# Patient Record
Sex: Female | Born: 1945 | Race: White | Hispanic: No | Marital: Married | Smoking: Never smoker
Health system: Southern US, Community
[De-identification: ages and names within clinical notes are randomized; demographics above are authoritative.]

## PROBLEM LIST (undated history)

## (undated) DIAGNOSIS — R1011 Right upper quadrant pain: Secondary | ICD-10-CM

## (undated) DIAGNOSIS — M1990 Unspecified osteoarthritis, unspecified site: Secondary | ICD-10-CM

## (undated) DIAGNOSIS — K5900 Constipation, unspecified: Secondary | ICD-10-CM

## (undated) DIAGNOSIS — T148XXA Other injury of unspecified body region, initial encounter: Secondary | ICD-10-CM

## (undated) DIAGNOSIS — R140 Abdominal distension (gaseous): Secondary | ICD-10-CM

## (undated) DIAGNOSIS — N390 Urinary tract infection, site not specified: Secondary | ICD-10-CM

## (undated) DIAGNOSIS — K219 Gastro-esophageal reflux disease without esophagitis: Secondary | ICD-10-CM

## (undated) DIAGNOSIS — R011 Cardiac murmur, unspecified: Secondary | ICD-10-CM

## (undated) DIAGNOSIS — G709 Myoneural disorder, unspecified: Secondary | ICD-10-CM

## (undated) DIAGNOSIS — T4145XA Adverse effect of unspecified anesthetic, initial encounter: Secondary | ICD-10-CM

## (undated) DIAGNOSIS — T8859XA Other complications of anesthesia, initial encounter: Secondary | ICD-10-CM

## (undated) DIAGNOSIS — F419 Anxiety disorder, unspecified: Secondary | ICD-10-CM

## (undated) DIAGNOSIS — F329 Major depressive disorder, single episode, unspecified: Secondary | ICD-10-CM

## (undated) DIAGNOSIS — R110 Nausea: Secondary | ICD-10-CM

## (undated) DIAGNOSIS — G479 Sleep disorder, unspecified: Secondary | ICD-10-CM

## (undated) DIAGNOSIS — I10 Essential (primary) hypertension: Secondary | ICD-10-CM

## (undated) DIAGNOSIS — F32A Depression, unspecified: Secondary | ICD-10-CM

## (undated) DIAGNOSIS — R3129 Other microscopic hematuria: Secondary | ICD-10-CM

## (undated) DIAGNOSIS — T7840XA Allergy, unspecified, initial encounter: Secondary | ICD-10-CM

## (undated) HISTORY — PX: CYST EXCISION: SHX5701

## (undated) HISTORY — PX: OTHER SURGICAL HISTORY: SHX169

## (undated) HISTORY — DX: Depression, unspecified: F32.A

## (undated) HISTORY — PX: BREAST LUMPECTOMY: SHX2

## (undated) HISTORY — DX: Unspecified osteoarthritis, unspecified site: M19.90

## (undated) HISTORY — DX: Allergy, unspecified, initial encounter: T78.40XA

## (undated) HISTORY — DX: Other injury of unspecified body region, initial encounter: T14.8XXA

## (undated) HISTORY — DX: Myoneural disorder, unspecified: G70.9

## (undated) HISTORY — PX: COLONOSCOPY: SHX174

## (undated) HISTORY — DX: Major depressive disorder, single episode, unspecified: F32.9

---

## 1995-01-05 HISTORY — PX: REFRACTIVE SURGERY: SHX103

## 1998-09-10 ENCOUNTER — Other Ambulatory Visit: Admission: RE | Admit: 1998-09-10 | Discharge: 1998-09-10 | Payer: Self-pay | Admitting: *Deleted

## 1998-10-30 ENCOUNTER — Encounter: Payer: Self-pay | Admitting: Family Medicine

## 1998-10-30 ENCOUNTER — Encounter: Admission: RE | Admit: 1998-10-30 | Discharge: 1998-10-30 | Payer: Self-pay | Admitting: Family Medicine

## 1999-09-03 ENCOUNTER — Other Ambulatory Visit: Admission: RE | Admit: 1999-09-03 | Discharge: 1999-09-03 | Payer: Self-pay | Admitting: *Deleted

## 2000-10-24 ENCOUNTER — Other Ambulatory Visit: Admission: RE | Admit: 2000-10-24 | Discharge: 2000-10-24 | Payer: Self-pay | Admitting: *Deleted

## 2001-02-24 ENCOUNTER — Encounter: Admission: RE | Admit: 2001-02-24 | Discharge: 2001-02-24 | Payer: Self-pay | Admitting: *Deleted

## 2001-02-24 ENCOUNTER — Encounter: Payer: Self-pay | Admitting: *Deleted

## 2001-11-20 ENCOUNTER — Other Ambulatory Visit: Admission: RE | Admit: 2001-11-20 | Discharge: 2001-11-20 | Payer: Self-pay | Admitting: Obstetrics & Gynecology

## 2002-03-03 ENCOUNTER — Encounter: Payer: Self-pay | Admitting: Family Medicine

## 2002-03-03 ENCOUNTER — Encounter: Admission: RE | Admit: 2002-03-03 | Discharge: 2002-03-03 | Payer: Self-pay | Admitting: Family Medicine

## 2002-10-15 ENCOUNTER — Emergency Department (HOSPITAL_COMMUNITY): Admission: EM | Admit: 2002-10-15 | Discharge: 2002-10-15 | Payer: Self-pay | Admitting: Emergency Medicine

## 2002-10-18 ENCOUNTER — Encounter: Payer: Self-pay | Admitting: Neurology

## 2002-10-18 ENCOUNTER — Encounter: Admission: RE | Admit: 2002-10-18 | Discharge: 2002-10-18 | Payer: Self-pay | Admitting: Neurology

## 2002-10-19 ENCOUNTER — Encounter: Payer: Self-pay | Admitting: Orthopedic Surgery

## 2002-10-19 ENCOUNTER — Encounter: Admission: RE | Admit: 2002-10-19 | Discharge: 2002-10-19 | Payer: Self-pay | Admitting: Orthopedic Surgery

## 2002-10-29 ENCOUNTER — Encounter: Payer: Self-pay | Admitting: Neurology

## 2002-10-29 ENCOUNTER — Ambulatory Visit (HOSPITAL_COMMUNITY): Admission: RE | Admit: 2002-10-29 | Discharge: 2002-10-29 | Payer: Self-pay | Admitting: Neurology

## 2002-11-26 ENCOUNTER — Other Ambulatory Visit: Admission: RE | Admit: 2002-11-26 | Discharge: 2002-11-26 | Payer: Self-pay | Admitting: Obstetrics & Gynecology

## 2003-04-23 ENCOUNTER — Ambulatory Visit (HOSPITAL_COMMUNITY): Admission: RE | Admit: 2003-04-23 | Discharge: 2003-04-23 | Payer: Self-pay | Admitting: Internal Medicine

## 2003-04-28 ENCOUNTER — Ambulatory Visit (HOSPITAL_COMMUNITY): Admission: RE | Admit: 2003-04-28 | Discharge: 2003-04-28 | Payer: Self-pay | Admitting: Internal Medicine

## 2003-05-14 ENCOUNTER — Ambulatory Visit (HOSPITAL_COMMUNITY): Admission: RE | Admit: 2003-05-14 | Discharge: 2003-05-14 | Payer: Self-pay | Admitting: Internal Medicine

## 2003-11-21 ENCOUNTER — Ambulatory Visit: Payer: Self-pay | Admitting: Internal Medicine

## 2004-01-01 ENCOUNTER — Ambulatory Visit: Payer: Self-pay | Admitting: Internal Medicine

## 2004-04-29 ENCOUNTER — Ambulatory Visit: Payer: Self-pay | Admitting: Internal Medicine

## 2004-05-12 ENCOUNTER — Ambulatory Visit: Payer: Self-pay | Admitting: Internal Medicine

## 2004-06-18 ENCOUNTER — Emergency Department (HOSPITAL_COMMUNITY): Admission: EM | Admit: 2004-06-18 | Discharge: 2004-06-18 | Payer: Self-pay | Admitting: Emergency Medicine

## 2004-07-02 ENCOUNTER — Ambulatory Visit (HOSPITAL_COMMUNITY): Admission: RE | Admit: 2004-07-02 | Discharge: 2004-07-02 | Payer: Self-pay | Admitting: Neurology

## 2004-11-10 ENCOUNTER — Ambulatory Visit: Payer: Self-pay | Admitting: Internal Medicine

## 2004-12-17 ENCOUNTER — Ambulatory Visit: Payer: Self-pay | Admitting: Internal Medicine

## 2005-03-12 ENCOUNTER — Encounter: Admission: RE | Admit: 2005-03-12 | Discharge: 2005-03-12 | Payer: Self-pay | Admitting: Obstetrics and Gynecology

## 2005-04-12 ENCOUNTER — Encounter: Admission: RE | Admit: 2005-04-12 | Discharge: 2005-04-12 | Payer: Self-pay | Admitting: Internal Medicine

## 2005-06-18 ENCOUNTER — Encounter: Admission: RE | Admit: 2005-06-18 | Discharge: 2005-06-18 | Payer: Self-pay | Admitting: Internal Medicine

## 2005-06-18 IMAGING — RF DG FL GUIDE NDL PLMT  - NRPT [facility]
1 series · 1 of 1 positions shown · IV contrast (gadolinium)
Comparison: none

CLINICAL DATA: Right hip pain. 
 FLUOROSCOPIC GUIDANCE FOR NEEDLE PLACEMENT RIGHT HIP ARTHROGRAM ([DATE] HOURS):
 Procedure:  The right anterior hip region was prepped and draped in a sterile fashion and lidocaine was utilized for local anesthesia.  Under fluoroscopic guidance, a 22 gauge spinal needle was inserted into the right hip joint.  Iodinated contrast, Lidocaine, and gadolinium were injected into the joint.  No complication.

[Series 1: (hospital) · 1 of 1 slices shown]
[im 1/1]
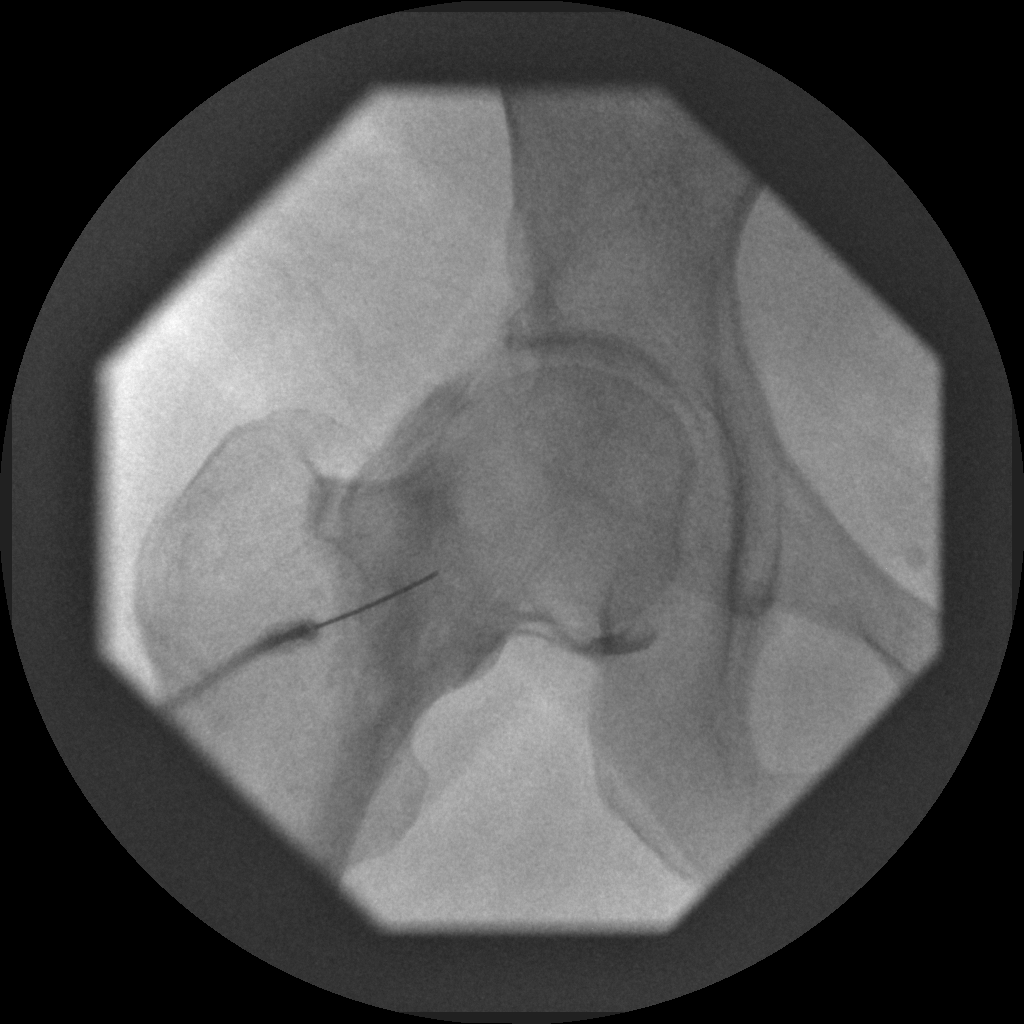

[1 of 1 positions shown; findings below may reference images not displayed]

FINDINGS: Images document needle placement in the right hip joint.  Contrast fills the right hip joint.
IMPRESSION: Right hip arthrogram in preparation for MRI.

## 2005-09-13 ENCOUNTER — Ambulatory Visit: Payer: Self-pay | Admitting: Internal Medicine

## 2005-10-01 ENCOUNTER — Ambulatory Visit: Payer: Self-pay | Admitting: Gastroenterology

## 2005-10-05 ENCOUNTER — Ambulatory Visit: Payer: Self-pay | Admitting: Internal Medicine

## 2006-01-24 ENCOUNTER — Ambulatory Visit: Payer: Self-pay | Admitting: Internal Medicine

## 2006-02-15 ENCOUNTER — Ambulatory Visit: Payer: Self-pay | Admitting: Internal Medicine

## 2006-02-15 LAB — CONVERTED CEMR LAB
Albumin: 3.9 g/dL (ref 3.5–5.2)
Basophils Absolute: 0 10*3/uL (ref 0.0–0.1)
Bilirubin, Direct: 0.1 mg/dL (ref 0.0–0.3)
Cholesterol: 192 mg/dL (ref 0–200)
Creatinine, Ser: 0.8 mg/dL (ref 0.4–1.2)
GFR calc non Af Amer: 78 mL/min
HDL: 42.1 mg/dL (ref >39.0)
Hemoglobin: 14.5 g/dL (ref 12.0–15.0)
Iron: 56 ug/dL (ref 42–145)
LDL Cholesterol: 134 mg/dL — ABNORMAL HIGH (ref 0–99)
Lymphocytes Relative: 21.2 % (ref 12.0–46.0)
MCHC: 34.8 g/dL (ref 30.0–36.0)
Monocytes Absolute: 0.4 10*3/uL (ref 0.2–0.7)
Monocytes Relative: 5.4 % (ref 3.0–11.0)
Neutro Abs: 5.1 10*3/uL (ref 1.4–7.7)
Nitrite: NEGATIVE
Phosphorus: 3.7 mg/dL (ref 2.3–4.6)
Potassium: 3.4 meq/L — ABNORMAL LOW (ref 3.5–5.1)
RDW: 12.5 % (ref 11.5–14.6)
Sodium: 140 meq/L (ref 135–145)
Specific Gravity, Urine: 1.02 (ref 1.000–1.03)
TSH: 2.13 microintl units/mL (ref 0.35–5.50)
Total Bilirubin: 1 mg/dL (ref 0.3–1.2)
Transferrin: 226.2 mg/dL (ref >=212.0)
Urine Glucose: NEGATIVE mg/dL
Urobilinogen, UA: 0.2 (ref 0.0–1.0)
pH: 6.5 (ref 5.0–8.0)

## 2006-04-20 ENCOUNTER — Encounter: Admission: RE | Admit: 2006-04-20 | Discharge: 2006-04-20 | Payer: Self-pay | Admitting: Obstetrics and Gynecology

## 2006-05-06 ENCOUNTER — Ambulatory Visit: Payer: Self-pay | Admitting: Internal Medicine

## 2006-07-30 ENCOUNTER — Encounter: Payer: Self-pay | Admitting: Internal Medicine

## 2006-07-30 DIAGNOSIS — I10 Essential (primary) hypertension: Secondary | ICD-10-CM | POA: Insufficient documentation

## 2006-07-30 DIAGNOSIS — N390 Urinary tract infection, site not specified: Secondary | ICD-10-CM

## 2006-11-02 ENCOUNTER — Ambulatory Visit: Payer: Self-pay | Admitting: Internal Medicine

## 2006-11-09 ENCOUNTER — Telehealth: Payer: Self-pay | Admitting: Internal Medicine

## 2006-11-28 ENCOUNTER — Encounter: Payer: Self-pay | Admitting: Internal Medicine

## 2007-01-07 ENCOUNTER — Encounter: Admission: RE | Admit: 2007-01-07 | Discharge: 2007-01-07 | Payer: Self-pay | Admitting: Orthopaedic Surgery

## 2007-02-03 ENCOUNTER — Encounter: Payer: Self-pay | Admitting: Internal Medicine

## 2007-03-10 ENCOUNTER — Ambulatory Visit: Payer: Self-pay | Admitting: Internal Medicine

## 2007-04-15 ENCOUNTER — Telehealth (INDEPENDENT_AMBULATORY_CARE_PROVIDER_SITE_OTHER): Payer: Self-pay | Admitting: *Deleted

## 2007-04-15 ENCOUNTER — Ambulatory Visit: Payer: Self-pay | Admitting: Family Medicine

## 2007-04-15 DIAGNOSIS — T22019A Burn of unspecified degree of unspecified forearm, initial encounter: Secondary | ICD-10-CM | POA: Insufficient documentation

## 2007-05-08 ENCOUNTER — Telehealth: Payer: Self-pay | Admitting: Internal Medicine

## 2007-06-01 ENCOUNTER — Encounter: Payer: Self-pay | Admitting: Internal Medicine

## 2007-06-09 ENCOUNTER — Ambulatory Visit: Payer: Self-pay | Admitting: Internal Medicine

## 2007-06-09 DIAGNOSIS — K219 Gastro-esophageal reflux disease without esophagitis: Secondary | ICD-10-CM

## 2007-06-09 DIAGNOSIS — R252 Cramp and spasm: Secondary | ICD-10-CM

## 2007-06-09 DIAGNOSIS — M545 Low back pain: Secondary | ICD-10-CM

## 2007-06-12 LAB — CONVERTED CEMR LAB
BUN: 17 mg/dL (ref 6–23)
Bilirubin Urine: NEGATIVE
Chloride: 103 meq/L (ref 96–112)
Creatinine, Ser: 0.6 mg/dL (ref 0.4–1.2)
Crystals: NEGATIVE
GFR calc non Af Amer: 108 mL/min
Glucose, Bld: 87 mg/dL (ref 70–99)
Ketones, ur: NEGATIVE mg/dL
Mucus, UA: NEGATIVE
Potassium: 4.1 meq/L (ref 3.5–5.1)
Urine Glucose: NEGATIVE mg/dL
Urobilinogen, UA: 0.2 (ref 0.0–1.0)

## 2007-07-11 ENCOUNTER — Encounter: Payer: Self-pay | Admitting: Internal Medicine

## 2007-07-18 ENCOUNTER — Telehealth: Payer: Self-pay | Admitting: Internal Medicine

## 2007-07-19 ENCOUNTER — Telehealth: Payer: Self-pay | Admitting: Internal Medicine

## 2007-07-24 ENCOUNTER — Encounter: Payer: Self-pay | Admitting: Internal Medicine

## 2007-07-27 ENCOUNTER — Encounter (INDEPENDENT_AMBULATORY_CARE_PROVIDER_SITE_OTHER): Payer: Self-pay | Admitting: Interventional Radiology

## 2007-07-27 ENCOUNTER — Encounter: Payer: Self-pay | Admitting: Internal Medicine

## 2007-07-27 ENCOUNTER — Encounter: Admission: RE | Admit: 2007-07-27 | Discharge: 2007-07-27 | Payer: Self-pay | Admitting: Internal Medicine

## 2007-07-27 ENCOUNTER — Other Ambulatory Visit: Admission: RE | Admit: 2007-07-27 | Discharge: 2007-07-27 | Payer: Self-pay | Admitting: Interventional Radiology

## 2007-07-28 ENCOUNTER — Ambulatory Visit: Payer: Self-pay | Admitting: Internal Medicine

## 2007-07-28 ENCOUNTER — Encounter: Payer: Self-pay | Admitting: Internal Medicine

## 2007-08-01 ENCOUNTER — Telehealth (INDEPENDENT_AMBULATORY_CARE_PROVIDER_SITE_OTHER): Payer: Self-pay | Admitting: *Deleted

## 2007-08-07 ENCOUNTER — Encounter: Payer: Self-pay | Admitting: Internal Medicine

## 2007-08-11 ENCOUNTER — Ambulatory Visit: Payer: Self-pay | Admitting: Endocrinology

## 2007-09-01 ENCOUNTER — Telehealth: Payer: Self-pay | Admitting: Internal Medicine

## 2007-09-19 ENCOUNTER — Telehealth: Payer: Self-pay | Admitting: Internal Medicine

## 2007-12-04 ENCOUNTER — Encounter: Payer: Self-pay | Admitting: Internal Medicine

## 2007-12-12 ENCOUNTER — Ambulatory Visit: Payer: Self-pay | Admitting: Internal Medicine

## 2007-12-12 DIAGNOSIS — R109 Unspecified abdominal pain: Secondary | ICD-10-CM | POA: Insufficient documentation

## 2007-12-12 DIAGNOSIS — R1011 Right upper quadrant pain: Secondary | ICD-10-CM | POA: Insufficient documentation

## 2007-12-14 LAB — CONVERTED CEMR LAB
Albumin: 3.7 g/dL (ref 3.5–5.2)
Alkaline Phosphatase: 71 units/L (ref 39–117)
Amylase: 71 units/L (ref 27–131)
Basophils Absolute: 0 10*3/uL (ref 0.0–0.1)
Basophils Relative: 0.1 % (ref 0.0–3.0)
Crystals: NEGATIVE
Eosinophils Absolute: 0.1 10*3/uL (ref 0.0–0.7)
Eosinophils Relative: 1.6 % (ref 0.0–5.0)
HCT: 40.7 % (ref 36.0–46.0)
Hemoglobin: 13.9 g/dL (ref 12.0–15.0)
Ketones, ur: NEGATIVE mg/dL
Lipase: 18 units/L (ref 11.0–59.0)
MCHC: 34.2 g/dL (ref 30.0–36.0)
MCV: 81.8 fL (ref 78.0–100.0)
Monocytes Absolute: 0.6 10*3/uL (ref 0.1–1.0)
Neutro Abs: 5.8 10*3/uL (ref 1.4–7.7)
RBC: 4.97 M/uL (ref 3.87–5.11)
Specific Gravity, Urine: 1.025 (ref 1.000–1.03)
Total Protein: 7.1 g/dL (ref 6.0–8.3)
Urine Glucose: NEGATIVE mg/dL
WBC: 8.8 10*3/uL (ref 4.5–10.5)

## 2007-12-19 ENCOUNTER — Encounter: Admission: RE | Admit: 2007-12-19 | Discharge: 2007-12-19 | Payer: Self-pay | Admitting: Internal Medicine

## 2007-12-25 ENCOUNTER — Telehealth (INDEPENDENT_AMBULATORY_CARE_PROVIDER_SITE_OTHER): Payer: Self-pay | Admitting: *Deleted

## 2008-01-14 ENCOUNTER — Encounter: Payer: Self-pay | Admitting: Endocrinology

## 2008-01-30 ENCOUNTER — Encounter: Admission: RE | Admit: 2008-01-30 | Discharge: 2008-01-30 | Payer: Self-pay | Admitting: Endocrinology

## 2008-06-06 ENCOUNTER — Encounter: Payer: Self-pay | Admitting: Internal Medicine

## 2008-06-06 ENCOUNTER — Ambulatory Visit: Payer: Self-pay | Admitting: Internal Medicine

## 2008-06-06 DIAGNOSIS — R079 Chest pain, unspecified: Secondary | ICD-10-CM

## 2008-06-10 ENCOUNTER — Encounter (INDEPENDENT_AMBULATORY_CARE_PROVIDER_SITE_OTHER): Payer: Self-pay | Admitting: *Deleted

## 2008-06-13 ENCOUNTER — Telehealth (INDEPENDENT_AMBULATORY_CARE_PROVIDER_SITE_OTHER): Payer: Self-pay | Admitting: Radiology

## 2008-06-13 ENCOUNTER — Telehealth (INDEPENDENT_AMBULATORY_CARE_PROVIDER_SITE_OTHER): Payer: Self-pay | Admitting: *Deleted

## 2008-06-13 ENCOUNTER — Encounter: Payer: Self-pay | Admitting: Internal Medicine

## 2008-06-14 ENCOUNTER — Encounter: Payer: Self-pay | Admitting: Internal Medicine

## 2008-06-14 ENCOUNTER — Telehealth: Payer: Self-pay | Admitting: Internal Medicine

## 2008-06-19 ENCOUNTER — Telehealth (INDEPENDENT_AMBULATORY_CARE_PROVIDER_SITE_OTHER): Payer: Self-pay | Admitting: *Deleted

## 2008-06-20 ENCOUNTER — Ambulatory Visit: Payer: Self-pay

## 2008-06-20 ENCOUNTER — Encounter: Payer: Self-pay | Admitting: Internal Medicine

## 2008-06-27 ENCOUNTER — Telehealth: Payer: Self-pay | Admitting: Internal Medicine

## 2008-07-04 HISTORY — PX: FACIAL COSMETIC SURGERY: SHX629

## 2008-12-30 ENCOUNTER — Ambulatory Visit: Payer: Self-pay | Admitting: Internal Medicine

## 2009-04-29 ENCOUNTER — Ambulatory Visit: Payer: Self-pay | Admitting: Internal Medicine

## 2009-04-29 LAB — CONVERTED CEMR LAB
Albumin: 3.8 g/dL (ref 3.5–5.2)
Basophils Relative: 0.6 % (ref 0.0–3.0)
Bilirubin, Direct: 0.1 mg/dL (ref 0.0–0.3)
CO2: 31 meq/L (ref 19–32)
Calcium: 9.4 mg/dL (ref 8.4–10.5)
Creatinine, Ser: 0.7 mg/dL (ref 0.4–1.2)
Eosinophils Relative: 3.6 % (ref 0.0–5.0)
GFR calc non Af Amer: 89.47 mL/min (ref >60)
Glucose, Bld: 89 mg/dL (ref 70–99)
Hemoglobin: 13.4 g/dL (ref 12.0–15.0)
Lymphocytes Relative: 26.1 % (ref 12.0–46.0)
Neutrophils Relative %: 64.7 % (ref 43.0–77.0)
RBC: 4.78 M/uL (ref 3.87–5.11)
Total Protein: 6.6 g/dL (ref 6.0–8.3)
Vitamin B-12: 492 pg/mL (ref 211–911)
WBC: 6.1 10*3/uL (ref 4.5–10.5)

## 2009-05-21 LAB — CONVERTED CEMR LAB
Anti Nuclear Antibody(ANA): NEGATIVE
Vit D, 25-Hydroxy: 26 ng/mL — ABNORMAL LOW (ref 30–89)

## 2009-05-26 ENCOUNTER — Ambulatory Visit: Payer: Self-pay | Admitting: Internal Medicine

## 2009-05-26 DIAGNOSIS — R1013 Epigastric pain: Secondary | ICD-10-CM

## 2009-05-26 DIAGNOSIS — E559 Vitamin D deficiency, unspecified: Secondary | ICD-10-CM | POA: Insufficient documentation

## 2009-05-26 DIAGNOSIS — R1310 Dysphagia, unspecified: Secondary | ICD-10-CM | POA: Insufficient documentation

## 2009-07-02 ENCOUNTER — Ambulatory Visit: Payer: Self-pay | Admitting: Internal Medicine

## 2009-07-02 ENCOUNTER — Ambulatory Visit (HOSPITAL_COMMUNITY): Admission: RE | Admit: 2009-07-02 | Discharge: 2009-07-02 | Payer: Self-pay | Admitting: Internal Medicine

## 2009-07-02 DIAGNOSIS — R209 Unspecified disturbances of skin sensation: Secondary | ICD-10-CM

## 2009-07-03 ENCOUNTER — Encounter: Payer: Self-pay | Admitting: Internal Medicine

## 2009-07-11 ENCOUNTER — Encounter: Admission: RE | Admit: 2009-07-11 | Discharge: 2009-07-11 | Payer: Self-pay | Admitting: Obstetrics and Gynecology

## 2009-07-11 ENCOUNTER — Telehealth: Payer: Self-pay | Admitting: Internal Medicine

## 2009-07-15 ENCOUNTER — Encounter: Payer: Self-pay | Admitting: Internal Medicine

## 2009-07-22 ENCOUNTER — Encounter: Payer: Self-pay | Admitting: Internal Medicine

## 2009-07-25 ENCOUNTER — Ambulatory Visit: Payer: Self-pay | Admitting: Internal Medicine

## 2009-07-25 ENCOUNTER — Telehealth: Payer: Self-pay | Admitting: Internal Medicine

## 2009-08-22 ENCOUNTER — Ambulatory Visit (HOSPITAL_COMMUNITY): Admission: RE | Admit: 2009-08-22 | Discharge: 2009-08-22 | Payer: Self-pay | Admitting: Gastroenterology

## 2009-09-01 ENCOUNTER — Encounter: Payer: Self-pay | Admitting: Internal Medicine

## 2009-09-01 ENCOUNTER — Telehealth: Payer: Self-pay | Admitting: Internal Medicine

## 2009-10-13 ENCOUNTER — Telehealth: Payer: Self-pay | Admitting: Internal Medicine

## 2009-12-23 ENCOUNTER — Ambulatory Visit: Payer: Self-pay | Admitting: Internal Medicine

## 2010-01-24 ENCOUNTER — Encounter: Payer: Self-pay | Admitting: Internal Medicine

## 2010-01-25 ENCOUNTER — Encounter: Payer: Self-pay | Admitting: Neurology

## 2010-01-25 ENCOUNTER — Encounter: Payer: Self-pay | Admitting: Obstetrics and Gynecology

## 2010-01-25 ENCOUNTER — Encounter: Payer: Self-pay | Admitting: Internal Medicine

## 2010-02-01 LAB — CONVERTED CEMR LAB
BUN: 24 mg/dL — ABNORMAL HIGH (ref 6–23)
Basophils Relative: 0.4 % (ref 0.0–1.0)
Bilirubin, Direct: 0.1 mg/dL (ref 0.0–0.3)
CO2: 31 meq/L (ref 19–32)
Creatinine, Ser: 0.7 mg/dL (ref 0.4–1.2)
Eosinophils Relative: 3.6 % (ref 0.0–5.0)
GFR calc Af Amer: 109 mL/min
Glucose, Bld: 91 mg/dL (ref 70–99)
HCT: 37.7 % (ref 36.0–46.0)
Hemoglobin: 13.1 g/dL (ref 12.0–15.0)
Lymphocytes Relative: 25.8 % (ref 12.0–46.0)
Monocytes Absolute: 0.4 10*3/uL (ref 0.2–0.7)
Monocytes Relative: 5.2 % (ref 3.0–11.0)
Neutro Abs: 5.6 10*3/uL (ref 1.4–7.7)
Neutrophils Relative %: 65 % (ref 43.0–77.0)
Potassium: 3.8 meq/L (ref 3.5–5.1)
Sodium: 141 meq/L (ref 135–145)
TSH: 1.71 microintl units/mL (ref 0.35–5.50)
Total Bilirubin: 0.7 mg/dL (ref 0.3–1.2)
Total Protein: 6.1 g/dL (ref 6.0–8.3)
Vit D, 1,25-Dihydroxy: 23 (ref 20–57)

## 2010-02-03 NOTE — Assessment & Plan Note (Signed)
Summary: abd pain/cd   Vital Signs:  Patient profile:   65 year old female Height:      61 inches Weight:      189.25 pounds BMI:     35.89 O2 Sat:      97 % on Room air Temp:     98.4 degrees F oral Pulse rate:   64 / minute Pulse rhythm:   regular BP sitting:   118 / 74  (left arm) Cuff size:   large  Vitals Entered By: Rock Nephew CMA (May 26, 2009 9:49 AM)  O2 Flow:  Room air CC: abdominal pain x 2wks w/ heart burn Is Patient Diabetic? No Pain Assessment Patient in pain? yes     Location: abdomen Type: burning   Primary Care Provider:  Tresa Garter MD  CC:  abdominal pain x 2wks w/ heart burn.  History of Present Illness: C/o abd pain after eating a spicy Tai food 2 wks ZOX:WRUEAVW and mucusy diarrhea x3 d - was very sick. Then got better, resolved. This Sat she had [place] food - spicy; felt sick right away: chest pressure, nausea. Took gasex. C/o burning and belcing. C/o problem swallowing - worse lately. No SOB. No cardiac symptoms .  Allergies: 1)  ! Celebrex 2)  ! Penicillin  Physical Exam  General:  Well-developed,well-nourished,in no acute distress; alert,appropriate and cooperative throughout examination Eyes:  No corneal or conjunctival inflammation noted. EOMI. Perrla. Nose:  External nasal examination shows no deformity or inflammation. Nasal mucosa are pink and moist without lesions or exudates. Mouth:  Oral mucosa and oropharynx without lesions or exudates.  Teeth in good repair. Neck:  No deformities, masses, or tenderness noted. Lungs:  Normal respiratory effort, chest expands symmetrically. Lungs are clear to auscultation, no crackles or wheezes. Heart:  Normal rate and regular rhythm. S1 and S2 normal without gallop, murmur, click, rub or other extra sounds. Abdomen:  Tender epigastrium No mass No rebound no HSM Msk:  No deformity or scoliosis noted of thoracic or lumbar spine.   Pulses:  R and L carotid,radial,femoral,dorsalis  pedis and posterior tibial pulses are full and equal bilaterally Extremities:  No clubbing, cyanosis, edema, or deformity noted with normal full range of motion of all joints.   Neurologic:  No cranial nerve deficits noted. Station and gait are normal. Plantar reflexes are down-going bilaterally. DTRs are symmetrical throughout. Sensory, motor and coordinative functions appear intact. 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 Alert and cooperative with normal attention span and concentration. No apparent delusions, illusions, hallucinations   Impression & Recommendations:  Problem # 1:  ABDOMINAL PAIN, EPIGASTRIC (ICD-789.06) Assessment New Likely - severe GERD and Esoph. stricture. Orders: Gastroenterology Referral (GI) Dr Kinnie Scales The labs were reviewed with the patient.   Problem # 2:  DYSPHAGIA UNSPECIFIED (ICD-787.20) Assessment: New  Orders: Gastroenterology Referral (GI)  Problem # 3:  GERD (ICD-530.81) Assessment: New  Her updated medication list for this problem includes:    Nexium 40 Mg Cpdr (Esomeprazole magnesium) .Marland Kitchen... 1 by mouth qam    Librax 2.5-5 Mg Caps (Clidinium-chlordiazepoxide) .Marland Kitchen... 1 by mouth qid as needed spasms in stomach     Ranitidine Hcl 150 Mg Tabs (Ranitidine hcl) .Marland Kitchen... 1 by mouth two times a day for indigestion  Problem # 4:  CHEST PAIN (ICD-786.50) Assessment: New  Orders: Gastroenterology Referral (GI)  Problem # 5:  VITAMIN D  DEFICIENCY (ICD-268.9) Assessment: Comment Only Restart Vit D The labs were reviewed with the patient.   Problem # 6:  SCAR (ICD-709.2) on face Assessment: Improved The labs were reviewed with the patient. No CTD present  Complete Medication List: 1)  Potassium Chloride Cr 10 Meq Tbcr (Potassium chloride) .Marland Kitchen.. 1 once daily 2)  Zolpidem Tartrate 10 Mg Tabs (Zolpidem tartrate) .... 1/2 or 1 by mouth at hs prn 3)   Vitamin D3 1000 Unit Tabs (Cholecalciferol) .Marland Kitchen.. 1 by mouth daily 4)  Wellbutrin Xl 150 Mg Xr24h-tab (Bupropion hcl) .... Take 1 by mouth qd 5)  Furosemide 40 Mg Tabs (Furosemide) .Marland Kitchen.. 1 by mouth q am ( a water pill) prn 6)  Vitamin D 1000 Unit Tabs (Cholecalciferol) .Marland Kitchen.. 1 by mouth qd 7)  Triamcinolone Acetonide 0.5 % Crea (Triamcinolone acetonide) .... Use two times a day prn 8)  Promethazine-codeine 6.25-10 Mg/24ml Syrp (Promethazine-codeine) .... 5-10 ml by mouth q id as needed cough 9)  Nexium 40 Mg Cpdr (Esomeprazole magnesium) .Marland Kitchen.. 1 by mouth qam 10)  Librax 2.5-5 Mg Caps (Clidinium-chlordiazepoxide) .Marland Kitchen.. 1 by mouth qid as needed spasms in stomach 11)  Ranitidine Hcl 150 Mg Tabs (Ranitidine hcl) .Marland Kitchen.. 1 by mouth two times a day for indigestion  Patient Instructions: 1)  Call if you are not better in a reasonable amount of time or if worse. Go to ER if feeling really bad! 2)  Nexium 1 twice a day Prescriptions: LIBRAX 2.5-5 MG CAPS (CLIDINIUM-CHLORDIAZEPOXIDE) 1 by mouth qid as needed spasms in stomach  #60 x 1   Entered and Authorized by:   Tresa Garter MD   Signed by:   Tresa Garter MD on 05/26/2009   Method used:   Print then Give to Patient   RxID:   [id number] RANITIDINE HCL 150 MG TABS (RANITIDINE HCL) 1 by mouth two times a day for indigestion  #60 x 12   Entered and Authorized by:   Tresa Garter MD   Signed by:   Tresa Garter MD on 05/26/2009   Method used:   Print then Give to Patient   RxID:   [id number] NEXIUM 40 MG CPDR (ESOMEPRAZOLE MAGNESIUM) 1 by mouth qam  #30 x 12   Entered and Authorized by:   Tresa Garter MD   Signed by:   Tresa Garter MD on 05/26/2009   Method used:   Print then Give to Patient   RxID:   [id number]

## 2010-02-03 NOTE — Progress Notes (Signed)
Summary: RESULTS  Phone Note Call from Patient   Summary of Call: Patient is requesting to know if Dr Debby Bud recieved results of HIDA scan she had 2 wks ago. She would also like to know results. This was ordered by Dr Kinnie Scales. Their office will not give results over the phone and patient would like results from Dr Debby Bud.  Initial call taken by: Lamar Sprinkles, CMA,  September 01, 2009 3:14 PM  Follow-up for Phone Call        checking eChart: no cystic or common bile duct obstruction; normal gallbladder ejection fraction. Follow-up by: Jacques Navy MD,  September 01, 2009 3:42 PM  Additional Follow-up for Phone Call Additional follow up Details #1::        Pt informed  Additional Follow-up by: Lamar Sprinkles, CMA,  September 01, 2009 5:01 PM

## 2010-02-03 NOTE — Progress Notes (Signed)
  Phone Note Outgoing Call   Reason for Call: Discuss lab or test results Summary of Call: Please call patient: U/A was positive for 1+ bacteria and 2-5 WBC. Go ahead and fill the Rx for macrobid and take as directed. Let me know if the burning symptoms do not resolve with treatment.   Thanks Initial call taken by: Jacques Navy MD,  July 25, 2009 1:48 PM  Follow-up for Phone Call        informed pt Follow-up by: Ami Bullins CMA,  July 25, 2009 2:48 PM    Prescriptions: MACROBID 100 MG CAPS (NITROFURANTOIN MONOHYD MACRO) 1 by mouth two times a day x 7  #14 x 0   Entered by:   Ami Bullins CMA   Authorized by:   Jacques Navy MD   Signed by:   Bill Salinas CMA on 07/25/2009   Method used:   Electronically to        [facility] [address] # [phone]* (retail)       [address]       [place], [place]  [address]       Ph: [phone]       Fax: [phone]   RxID:   [id number]

## 2010-02-03 NOTE — Progress Notes (Signed)
Summary: TETANUS  Phone Note Call from Patient   Summary of Call: Patient is requesting to know when her last tetanus vaccine was b/c she has chance to get this at work. Chart ordered. Initial call taken by: Lamar Sprinkles, CMA,  October 13, 2009 5:12 PM  Follow-up for Phone Call        Per chart 2004 - Pt due 2014, Pt informed  Follow-up by: Lamar Sprinkles, CMA,  October 14, 2009 6:14 PM      Immunization History:  Tetanus/Td Immunization History:    Tetanus/Td:  historical (01/04/2002)

## 2010-02-03 NOTE — Procedures (Signed)
Summary: Allen Norris MD  Endo/Jeffrey [facility] MD   Imported By: Lester [place] 08/04/2009 08:42:51  _____________________________________________________________________  External Attachment:    Type:   Image     Comment:   External Document

## 2010-02-03 NOTE — Assessment & Plan Note (Signed)
Summary: SWITCH'D FROM PLOT TO MEN/OK'D/F/U UTI/CD   Vital Signs:  Patient profile:   65 year old female Height:      61 inches Weight:      191 pounds BMI:     36.22 O2 Sat:      97 % on Room air Temp:     98.7 degrees F oral Pulse rate:   68 / minute BP sitting:   122 / 74  (left arm) Cuff size:   large  Vitals Entered By: Bill Salinas CMA (July 25, 2009 11:17 AM)  O2 Flow:  Room air Comments pt not taking anything on her med list but zolpidem and nexium.   Primary Care Provider:  Tresa Garter MD   History of Present Illness: Patient presents for follow-up. Since her last visit she has had EGD by Dr. Kinnie Scales. She was found to have gastritis and had nexium increased to two times a day. She continues to have RUQ pain with meals. Her u/s was negative for stones or gallbladder disease. She did have a possible hemangioma liver.  She has had a burning discomfort in the area of the bladder and some dysuria. She had a UTI at outside lab - results pending. She was treated with Cipro but continues to be symptomatic. She does decribe a bladder type pain. Azo-pyridine does give her relief.  Current Medications (verified): 1)  Zolpidem Tartrate 10 Mg  Tabs (Zolpidem Tartrate) .... 1/2 or 1 By Mouth At [facility] Prn 2)  Wellbutrin Xl 150 Mg Xr24h-Tab (Bupropion Hcl) .... Take 1 By Mouth Qd 3)  Furosemide 40 Mg Tabs (Furosemide) .Marland Kitchen.. 1 By Mouth Q Am ( A Water Pill) Prn 4)  Vitamin D 1000 Unit Tabs (Cholecalciferol) .Marland Kitchen.. 1 By Mouth Qd 5)  Triamcinolone Acetonide 0.5 % Crea (Triamcinolone Acetonide) .... Use Two Times A Day Prn 6)  Promethazine-Codeine 6.25-10 Mg/75ml Syrp (Promethazine-Codeine) .... 5-10 Ml By Mouth Q Id As Needed Cough 7)  Nexium 40 Mg Cpdr (Esomeprazole Magnesium) .Marland Kitchen.. 1 By Mouth Qam 8)  Librax 2.5-5 Mg Caps (Clidinium-Chlordiazepoxide) .Marland Kitchen.. 1 By Mouth Qid As Needed Spasms in Stomach  Allergies (verified): 1)  ! Celebrex 2)  ! Penicillin PMH-FH-SH reviewed-no changes  except otherwise noted  Review of Systems  The patient denies anorexia, fever, chest pain, syncope, dyspnea on exertion, prolonged cough, abdominal pain, hematochezia, incontinence, muscle weakness, difficulty walking, abnormal bleeding, and enlarged lymph nodes.    Physical Exam  General:  Well-developed,well-nourished,in no acute distress; alert,appropriate and cooperative throughout examination Head:  normocephalic and atraumatic.   Eyes:  pupils equal and pupils round.  C&S clear Lungs:  normal respiratory effort.   Heart:  normal rate and regular rhythm.     Impression & Recommendations:  Problem # 1:  RUQ PAIN (ICD-789.01) Being treated for gastritis. She is scheduled for hepato-biliary scan per Dr. Kinnie Scales  Problem # 2:  UTI'S, HX OF (ICD-V13.00) Patient has symptoms similar to previous UTI but has not responded to cipro.   Plan- if U/A is abnormal will treat with macorbid two times a day x 7, Rx provided to patient          She may use azo-pyridine for pain          if U/A is normal will need to consider GU referral to evaluate for IC  Complete Medication List: 1)  Vitamin D 1000 Unit Tabs (Cholecalciferol) .Marland Kitchen.. 1 by mouth qd 2)  Nexium 40 Mg Cpdr (Esomeprazole magnesium) .Marland KitchenMarland KitchenMarland Kitchen 1  by mouth two times a day 3)  Macrobid 100 Mg Caps (Nitrofurantoin monohyd macro) .Marland Kitchen.. 1 by mouth two times a day x 7 Prescriptions: MACROBID 100 MG CAPS (NITROFURANTOIN MONOHYD MACRO) 1 by mouth two times a day x 7  #14 x 0   Entered and Authorized by:   Jacques Navy MD   Signed by:   Jacques Navy MD on 07/25/2009   Method used:   Electronically to        [facility] [address] # [phone]* (retail)       [address]       [place], [place]  [address]       Ph: [phone]       Fax: [phone]   RxID:   [id number]

## 2010-02-03 NOTE — Letter (Signed)
[facility]   Imported By: Sherian Rein 08/05/2009 14:07:04  _____________________________________________________________________  External Attachment:    Type:   Image     Comment:   External Document

## 2010-02-03 NOTE — Assessment & Plan Note (Signed)
Summary: right sided abd pain, chills, facial tingling/SD   Vital Signs:  Patient profile:   65 year old female Height:      61 inches Weight:      192 pounds BMI:     36.41 O2 Sat:      95 % on Room air Temp:     98.5 degrees F oral Pulse rate:   70 / minute BP sitting:   118 / 82  (left arm) Cuff size:   large  Vitals Entered By: Bill Salinas CMA (July 02, 2009 11:51 AM)  O2 Flow:  Room air CC: pt here with c/o abd pain with burning up into her esophagus x 1 month. Symptoms occur with mildly spicey food and pt states symptoms of pain on right side has started and radiating to her back. She also has c/o tingling on the right side of her face on occasions/ ab   Primary Care Provider:  Tresa Garter MD  CC:  pt here with c/o abd pain with burning up into her esophagus x 1 month. Symptoms occur with mildly spicey food and pt states symptoms of pain on right side has started and radiating to her back. She also has c/o tingling on the right side of her face on occasions/ ab.  History of Present Illness: Since the beginning of May she has had abdominal pain for which she has seen Dr. Posey Rea. She dates this to a [place] meal. Symptoms lasted 3 days. She had another bout several weeks later. Dr. Posey Rea has referred her for EGD, scheduled for tomorrow.  She reports that she will have a variable bowel habit: constipation or voluminous diarrhea-4-5 stools. No blood in the stool, question of mucus. The stool is black. She denies bloating or flatus.  For weeks she has constant pain in the right side, just under the rib cage with radiation to the back. She has a gallbladder.   She c/o paresthesia of the right face x 4days. She did have plastic surgery for scar revision in the pre-auricular area right with sutures still in place. No drooling, no lid lag, no facial droop. She has had close follow-up by the plastic surgeon.   She c/o feeling dizzy for several days, longer than the facial  paresthesia. She describes this as a whooziness.   Current Medications (verified): 1)  Potassium Chloride Cr 10 Meq Tbcr (Potassium Chloride) .Marland Kitchen.. 1 Once Daily 2)  Zolpidem Tartrate 10 Mg  Tabs (Zolpidem Tartrate) .... 1/2 or 1 By Mouth At [facility] Prn 3)  Vitamin D3 1000 Unit  Tabs (Cholecalciferol) .Marland Kitchen.. 1 By Mouth Daily 4)  Wellbutrin Xl 150 Mg Xr24h-Tab (Bupropion Hcl) .... Take 1 By Mouth Qd 5)  Furosemide 40 Mg Tabs (Furosemide) .Marland Kitchen.. 1 By Mouth Q Am ( A Water Pill) Prn 6)  Vitamin D 1000 Unit Tabs (Cholecalciferol) .Marland Kitchen.. 1 By Mouth Qd 7)  Triamcinolone Acetonide 0.5 % Crea (Triamcinolone Acetonide) .... Use Two Times A Day Prn 8)  Promethazine-Codeine 6.25-10 Mg/32ml Syrp (Promethazine-Codeine) .... 5-10 Ml By Mouth Q Id As Needed Cough 9)  Nexium 40 Mg Cpdr (Esomeprazole Magnesium) .Marland Kitchen.. 1 By Mouth Qam 10)  Librax 2.5-5 Mg Caps (Clidinium-Chlordiazepoxide) .Marland Kitchen.. 1 By Mouth Qid As Needed Spasms in Stomach 11)  Ranitidine Hcl 150 Mg Tabs (Ranitidine Hcl) .Marland Kitchen.. 1 By Mouth Two Times A Day For Indigestion  Allergies (verified): 1)  ! Celebrex 2)  ! Penicillin  Review of Systems       The patient  complains of anorexia, abdominal pain, melena, and severe indigestion/heartburn.  The patient denies fever, weight loss, weight gain, vision loss, decreased hearing, chest pain, syncope, dyspnea on exertion, prolonged cough, hematochezia, hematuria, muscle weakness, difficulty walking, depression, and abnormal bleeding.    Physical Exam  General:  overweight white female in no acute distress Head:  normocephalic and atraumatic.  Well healed surgical incision anterior to the ear from above the ear to the angle of the jaw, no erythema, no fluctuance or significant tenderness Eyes:  vision grossly intact, pupils equal, and pupils round.  EOMI. C&S clear. Lids are normal without ptosis Lungs:  normal respiratory effort and normal breath sounds.   Heart:  normal rate and regular rhythm.   Abdomen:  soft and  normal bowel sounds.  Very tender to palpation and percussion RUQ. No palpable gallbladder bulb. No hepatomegaly - exam limited by tenderness Neurologic:  alert & oriented X3.  Nl facial symmetry and muscle movement, no deviation of tongue, no fasiculation, decreased sensation right face to light touch, no ptosis. PERRLA EOMI   Impression & Recommendations:  Problem # 1:  RUQ PAIN (ICD-789.01) Very tender right upper quadrant. Patient reports that she has had negative GB U/S and HIDDA several years ago. Nonetheless her symptoms are worrisome. She does have black stools. Differential: duodenal ulcer vs duodenitis vs Cholelithiasis.  Plan - GB U/S           keep EGD appt for 6/30  Orders: Radiology Referral (Radiology)  Addendum - U/S normal  Problem # 2:  FACIAL PARESTHESIA, RIGHT (ICD-782.0) Normal neuro exam with no evidence of CNS event. More likely a peripheral event possibly related to recent surgery  Plan - f/u with her surgeon.  Complete Medication List: 1)  Potassium Chloride Cr 10 Meq Tbcr (Potassium chloride) .Marland Kitchen.. 1 once daily 2)  Zolpidem Tartrate 10 Mg Tabs (Zolpidem tartrate) .... 1/2 or 1 by mouth at hs prn 3)  Vitamin D3 1000 Unit Tabs (Cholecalciferol) .Marland Kitchen.. 1 by mouth daily 4)  Wellbutrin Xl 150 Mg Xr24h-tab (Bupropion hcl) .... Take 1 by mouth qd 5)  Furosemide 40 Mg Tabs (Furosemide) .Marland Kitchen.. 1 by mouth q am ( a water pill) prn 6)  Vitamin D 1000 Unit Tabs (Cholecalciferol) .Marland Kitchen.. 1 by mouth qd 7)  Triamcinolone Acetonide 0.5 % Crea (Triamcinolone acetonide) .... Use two times a day prn 8)  Promethazine-codeine 6.25-10 Mg/40ml Syrp (Promethazine-codeine) .... 5-10 ml by mouth q id as needed cough 9)  Nexium 40 Mg Cpdr (Esomeprazole magnesium) .Marland Kitchen.. 1 by mouth qam 10)  Librax 2.5-5 Mg Caps (Clidinium-chlordiazepoxide) .Marland Kitchen.. 1 by mouth qid as needed spasms in stomach 11)  Ranitidine Hcl 150 Mg Tabs (Ranitidine hcl) .Marland Kitchen.. 1 by mouth two times a day for indigestion

## 2010-02-03 NOTE — Progress Notes (Signed)
Summary: UTI  Phone Note Call from Patient Call back at Home Phone [phone]   Summary of Call: Patient left message on triage that she needs prescription for UTI. Please advise. Initial call taken by: Lucious Groves,  July 11, 2009 8:34 AM  Follow-up for Phone Call        ok cipro OV if sick Follow-up by: Tresa Garter MD,  July 11, 2009 5:45 PM  Additional Follow-up for Phone Call Additional follow up Details #1::        Pt informed  Additional Follow-up by: Lamar Sprinkles, CMA,  July 11, 2009 5:54 PM    New/Updated Medications: CIPROFLOXACIN HCL 250 MG TABS (CIPROFLOXACIN HCL) 1 by mouth two times a day for cystitis Prescriptions: CIPROFLOXACIN HCL 250 MG TABS (CIPROFLOXACIN HCL) 1 by mouth two times a day for cystitis  #10 x 0   Entered and Authorized by:   Tresa Garter MD   Signed by:   Lamar Sprinkles, CMA on 07/11/2009   Method used:   Electronically to        [facility] [address] # [phone]* (retail)       [address]       [place], [place]  [address]       Ph: [phone]       Fax: [phone]   RxID:   [id number]

## 2010-02-03 NOTE — Letter (Signed)
[facility]   Imported By: Lennie Odor 09/16/2009 11:59:09  _____________________________________________________________________  External Attachment:    Type:   Image     Comment:   External Document

## 2010-02-05 NOTE — Assessment & Plan Note (Signed)
Summary: EAR PAIN/NWS   Vital Signs:  Patient profile:   65 year old female Height:      61 inches Weight:      176 pounds BMI:     33.38 O2 Sat:      96 % on Room air Temp:     98.4 degrees F oral Pulse rate:   76 / minute BP sitting:   122 / 72  (left arm) Cuff size:   large  Vitals Entered By: Bill Salinas CMA (December 23, 2009 2:21 PM)  O2 Flow:  Room air CC: pt here with c/o aching in right ear with slight sore throat and drainage/ ab   Primary Care Provider:  Georgina Quint Plotnikov MD  CC:  pt here with c/o aching in right ear with slight sore throat and drainage/ ab.  History of Present Illness: c/o pressure in right ear and awareness of pulsations. Hearing is muffled. No sinus congestion or drainage. MIld sore throat. No DOE, wheezing or other respiratory symptoms.   Current Medications (verified): 1)  Vitamin D 1000 Unit Tabs (Cholecalciferol) .Marland Kitchen.. 1 By Mouth Qd 2)  Nexium 40 Mg Cpdr (Esomeprazole Magnesium) .Marland Kitchen.. 1 By Mouth Two Times A Day 3)  Valium 5 Mg Tabs (Diazepam) .... Prn 4)  Furosemide 40 Mg Tabs (Furosemide) .... 3x A Week 5)  Klor-Con M10 10 Meq Cr-Tabs (Potassium Chloride Crys Cr) .... 3x A Week  Allergies (verified): 1)  ! Celebrex 2)  ! 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  The patient denies anorexia, fever, weight loss, weight gain, vision loss, chest pain, syncope, dyspnea on exertion, abdominal pain, muscle weakness, suspicious skin lesions, difficulty walking, and enlarged lymph nodes.    Physical Exam  General:  Well-developed,well-nourished,in no acute distress; alert,appropriate and cooperative throughout examination Head:  normocephalic and atraumatic.  No tenderness to percussion over the frontal or maxillary sinus Eyes:  C&S clear Ears:  EACs with scant    Impression & Recommendations:  Problem # 1:  EUSTACHIAN TUBE DYSFUNCTION, RIGHT (ICD-381.81) eustachian tube dysfunction. Reviewed mechanism with patient (including cartoon)  Plan - sudafed 30mg  two times a day or three times a day.   Complete Medication List: 1)  Vitamin D 1000 Unit Tabs (Cholecalciferol) .Marland Kitchen.. 1 by mouth qd 2)  Nexium 40 Mg Cpdr (Esomeprazole magnesium) .Marland Kitchen.. 1 by mouth two times a day 3)  Valium 5 Mg Tabs (Diazepam) .... Prn 4)  Furosemide 40 Mg Tabs (Furosemide) .... 3x a week 5)  Klor-con M10 10 Meq Cr-tabs (Potassium chloride crys cr) .... 3x a week   Orders Added: 1)  Est. Patient Level III [16109]

## 2010-03-17 ENCOUNTER — Encounter: Payer: Self-pay | Admitting: Internal Medicine

## 2010-03-17 ENCOUNTER — Ambulatory Visit (INDEPENDENT_AMBULATORY_CARE_PROVIDER_SITE_OTHER): Payer: Commercial Managed Care - PPO | Admitting: Internal Medicine

## 2010-03-17 DIAGNOSIS — R300 Dysuria: Secondary | ICD-10-CM

## 2010-03-17 DIAGNOSIS — R498 Other voice and resonance disorders: Secondary | ICD-10-CM

## 2010-03-17 LAB — CONVERTED CEMR LAB
Bilirubin Urine: NEGATIVE
Nitrite: NEGATIVE
Urobilinogen, UA: 0.2
WBC Urine, dipstick: NEGATIVE
pH: 5
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
## 2010-04-07 ENCOUNTER — Telehealth: Payer: Self-pay | Admitting: *Deleted

## 2010-04-07 MED ORDER — SCOPOLAMINE 1 MG/3DAYS TD PT72: 1.0000 | MEDICATED_PATCH | TRANSDERMAL | Status: DC

## 2010-04-07 NOTE — Telephone Encounter (Signed)
Patient requesting patch for motion sickness. She is going on a cruise on a sailboat next week.

## 2010-04-07 NOTE — Telephone Encounter (Signed)
Ok for scopolamine patch, apply q 72 hours. # 3 

## 2010-04-07 NOTE — Telephone Encounter (Signed)
Patient informed. 

## 2010-05-03 ENCOUNTER — Encounter: Payer: Self-pay | Admitting: Cardiovascular Disease

## 2010-05-19 ENCOUNTER — Encounter: Payer: Self-pay | Admitting: Internal Medicine

## 2010-05-21 ENCOUNTER — Ambulatory Visit: Payer: Commercial Managed Care - PPO | Admitting: Internal Medicine

## 2010-05-21 ENCOUNTER — Telehealth: Payer: Self-pay | Admitting: Internal Medicine

## 2010-05-21 NOTE — Telephone Encounter (Signed)
Pt was very upset over wait time, wanted Medical Release form, was told to go down stairs to Medical Records, came back up and told me to cancel upcoming appt, that she was done with this place

## 2010-05-22 NOTE — Letter (Signed)
November 19, 2005    Griffith Citron, M.D.  [facility] [place], [place] [address]   RE:  EMMAJO, BENNETTE  MRN:  [id number]  /  DOB:  01/22/1945   Dear Trey Paula,   Thank you very much for seeing our mutual patient, Ms. Penelope Galas, for  a consultation.  I apologize for a misunderstanding and for the fact that I  sent her to see Stan Head for a GI consultation.  I probably did not have  a chart at the time, and the patient failed to mention that she has been  seeing you for her GI problems. It was an unintentional mistake on my part.   I can assure you that I value your medical expertise very highly and always  appreciate your excellent medical care.  Again, I apologize for the mistake.    Sincerely,      Georgina Quint. Plotnikov, MD  Electronically Signed    AVP/MedQ  DD: 11/19/2005  DT: 11/19/2005  Job #: [id number]

## 2010-05-22 NOTE — Assessment & Plan Note (Signed)
[facility]                           GASTROENTEROLOGY OFFICE NOTE   NAME:Murad, LAELAH SIRAVO                    MRN:          [id number]  DATE:10/05/2005                            DOB:          1945-10-19    REQUESTING PHYSICIAN:  Georgina Quint. Plotnikov, MD   REASON FOR CONSULTATION:  Abdominal pain.   ASSESSMENT:  A 65 year old white woman with a several month history of  epigastric pain.  Initially severe, but getting better on Prevacid and after  stopping biotin and __________.  She has also a chronic right upper quadrant  burning and a post defecation loose stool habit and a bad taste in her  mouth.  She had an abdominal ultrasound with a slightly irregular and  somewhat thickened gallbladder walls of 3-5 mm of questionable significance  on 10/01/2005.   Etiology of these symptoms are not clear  Gallbladder problems could be part  of the problem.  It could be a gastritis or a peptic ulcer disease.  It  could have been some sort of reaction to the biotin that she was taking; as  the bottle does indicate, gastrointestinal upset is possible.  She is  improving.  Her hemoglobin was normal on 09/10 and her LFTs have been  normal.   RECOMMENDATIONS AND PLAN:  1. I told her that I thought she should have an upper endoscopy, but she      is refusing any invasive tests before she leaves for a [place]      cruise on Tuesday of next week, which is approximately a week from now.  2. Given the thickened gallbladder wall a HIDA scan with ejection fraction      is reasonable.  She had this scheduled for 6 days from now, but she      called back to the office asking that we FAX over the copy of the      ultrasound to Dr. Kinnie Scales who had performed a screening colonoscopy on      her last year.  We informed her that we needed a release of information      signed to do so.  She then cancelled her HIDA scan.  I left her a      message stating on her voice  mail, at work, stating that we were trying      to help her with her care; and that we were only following appropriate      policies when it came to the release of information.  I communicated      this to Dr. Posey Rea.   HISTORY:  A 65 year old white woman who has chronic pain syndrome on Lyrica.  She has been having intermittent epigastric pain, doubling over initially,  really bad, associated with some loose stools after she eats, gas and  bloating problems.  Everybody in her office has had gastrointestinal  syndromes with viruses etcetera but these symptoms seem to start before that  ran through her office.   Since starting Prevacid she is feeling a little bit better.  She had an  abdominal ultrasound read by Dr. Victorino Dike  on 10/01/2005, results as  above.  There were no gallstones.  A CBC was normal on September 10; and a  CMET was normal except for an albumin of 3.4 and a glucose of 108.  Her TSH  was normal.  Her helicobacter antibody was negative.   There has been no bleeding from the rectum.  Her weight is increased, she  thinks that she is retaining fluid.  She has heartburn symptoms and some of  the pain has radiated from the abdomen up into the chest.   PAST MEDICAL HISTORY:  1. Hypertension.  2. Depression.  3. Anxiety.  4. Chronic pain syndrome with chronic lower extremity pain.  5. Frequent urinary tract infections.  6. Cesarean section x2.  7. Laser therapy of the eyes.  8. Breast biopsies x2.   MEDICATIONS:  1. Lyrica 50 mg daily.  2. Prevacid 30 mg daily.  3. CombiPatch every week.  4. Ambien at bedtime.  5. HCTZ 25 mg daily.  6. Rogaine cream daily.  7. Calcium with vitamin D.  8. Prozac 20 mg daily.   DRUG ALLERGIES:  CELEBREX and PENICILLIN.   FAMILY HISTORY:  No colon cancer.  Her mother has had surgery for  diverticulosis.  An aunt had breast cancer.  Mother had heart disease.   ADDITIONAL HISTORY:  She did have a screening colonoscopy  performed by Dr.  Sharrell Ku on 09/28/2004 showing mild sigmoid diverticulosis and  internal hemorrhoids, no polyps reported.   SOCIAL HISTORY:  She is married, 2 sons.  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  She is going to [place] on Tuesday.  Drinks 1-2 glasses of  alcohol a week.   REVIEW OF SYSTEMS:  See my medical history form for full details.  She has  had some alopecia problems.   PHYSICAL EXAMINATION:  Reveals an obese white woman in no acute distress.  Height 5 feet 1 inch.  Weight 204.  Blood pressure 110/70, pulse 18.  EYES:  Anicteric.  ENT:  Normal.  __________  NECK:  Supple.  No masses.  CHEST:  Clear.  HEART:  S1-S2. No murmurs or gallops.  ABDOMEN:  Mildly tender in the epigastric, obese.  No organomegaly or masses  detected.  LYMPHATIC:  No neck or supraclavicular nodes palpated.  EXTREMITIES:  Trace peripheral edema in the lower extremities.  SKIN:  Warm and dry. No acute rash seen in the trunk.  PSYCHIATRIC:  She is alert and oriented x3.       Iva Boop, MD,FACG      CEG/MedQ  DD:  10/05/2005  DT:  10/06/2005  Job #:  [id number]   cc:   Georgina Quint. Plotnikov, MD

## 2010-05-26 ENCOUNTER — Ambulatory Visit (HOSPITAL_BASED_OUTPATIENT_CLINIC_OR_DEPARTMENT_OTHER)
Admission: RE | Admit: 2010-05-26 | Discharge: 2010-05-26 | Disposition: A | Discharge status: Home / Self Care | Payer: Commercial Managed Care - PPO | Source: Ambulatory Visit | Attending: Internal Medicine | Admitting: Internal Medicine

## 2010-05-26 ENCOUNTER — Ambulatory Visit (INDEPENDENT_AMBULATORY_CARE_PROVIDER_SITE_OTHER): Payer: Commercial Managed Care - PPO | Admitting: Internal Medicine

## 2010-05-26 ENCOUNTER — Ambulatory Visit: Payer: Commercial Managed Care - PPO | Admitting: Internal Medicine

## 2010-05-26 ENCOUNTER — Other Ambulatory Visit: Payer: Self-pay | Admitting: Internal Medicine

## 2010-05-26 DIAGNOSIS — K7689 Other specified diseases of liver: Secondary | ICD-10-CM | POA: Insufficient documentation

## 2010-05-26 DIAGNOSIS — K219 Gastro-esophageal reflux disease without esophagitis: Secondary | ICD-10-CM

## 2010-05-26 DIAGNOSIS — R1011 Right upper quadrant pain: Secondary | ICD-10-CM | POA: Insufficient documentation

## 2010-05-26 DIAGNOSIS — Q619 Cystic kidney disease, unspecified: Secondary | ICD-10-CM | POA: Insufficient documentation

## 2010-05-26 DIAGNOSIS — R109 Unspecified abdominal pain: Secondary | ICD-10-CM

## 2010-05-26 MED ORDER — IOHEXOL 300 MG/ML  SOLN
100.0000 mL | Freq: Once | INTRAMUSCULAR | Status: AC | PRN
  Administered 2010-05-26: 100 mL via INTRAVENOUS

## 2010-05-27 ENCOUNTER — Other Ambulatory Visit: Payer: Self-pay | Admitting: Internal Medicine

## 2010-05-27 NOTE — Telephone Encounter (Signed)
Ok to Rf? 

## 2010-05-28 ENCOUNTER — Telehealth: Payer: Self-pay | Admitting: *Deleted

## 2010-05-28 NOTE — Telephone Encounter (Signed)
Fax from [facility] on [address] [phone] Chlordiazepoxide/clidinium caps SIG take one capsule by mouth four times a day as needed for spasms in stomach. This pt has switched care to another dr. About 2 weeks ago. Please Advise as to what to do regarding medication

## 2010-05-29 NOTE — Telephone Encounter (Signed)
Has she seen new doctor? If so, they do refill. If not and it is less than 30 days since d/c from practice it is ok to refill one time only.

## 2010-06-02 ENCOUNTER — Telehealth: Payer: Self-pay | Admitting: *Deleted

## 2010-06-02 NOTE — Telephone Encounter (Signed)
DENIED, she fired [place] all.

## 2010-06-02 NOTE — Telephone Encounter (Signed)
rec rf req for Chlordiazepoxide/clidinium caps. 1 po qid prn spasms. # 60. Last filled 05-26-09. Ok to Rf?

## 2010-06-05 NOTE — Telephone Encounter (Signed)
Pt has started with new md

## 2010-06-08 ENCOUNTER — Encounter: Payer: Self-pay | Admitting: Gastroenterology

## 2010-06-08 ENCOUNTER — Ambulatory Visit (INDEPENDENT_AMBULATORY_CARE_PROVIDER_SITE_OTHER): Payer: Commercial Managed Care - PPO | Admitting: Gastroenterology

## 2010-06-08 ENCOUNTER — Other Ambulatory Visit: Payer: Self-pay | Admitting: Gastroenterology

## 2010-06-08 VITALS — BP 126/72 | HR 72 | Ht 61.0 in | Wt 165.0 lb

## 2010-06-08 DIAGNOSIS — R1011 Right upper quadrant pain: Secondary | ICD-10-CM

## 2010-06-08 MED ORDER — HYOSCYAMINE SULFATE ER 0.375 MG PO TBCR: EXTENDED_RELEASE_TABLET | ORAL | Status: DC

## 2010-06-08 NOTE — Assessment & Plan Note (Addendum)
Extensive workup is pertinent for a HIDA scan where CCK elicited right upper quadrant pain. Although she seems to improve with anticholinergics I remain suspicious that she has chronic cholecystitis.  Recommendations #1 trial of hyomax 0.375 mg twice a day for 10 days and then as needed; discontinue Librax #2 unless she has significant improvement with hyomax I would consider surgical referral for cholecystectomy

## 2010-06-08 NOTE — Progress Notes (Signed)
History of Present Illness: Rhonda Henry is a pleasant 65 year old white female referred at the request of Dr. Constance Goltz for evaluation of abdominal pain. For the past 2 years she's been complaining of intermittent right upper quadrant pain. Pain is described as dull, aching pain to severe, intense pain. It may radiate around or through to the back and lasts hours at a time. She's had nausea with vomiting during episodes of pain. It is unaffected by position. She noted diminution of pain when she was on a low fat diet and has had some improvement in pain with taking Librax. She has undergone extensive workup in the past.   HIDA scan in August, 2011 demonstrated a normal gallbladder ejection fraction although the patient experienced some pain during CCK infusion. There was also delayed visualization of the small bowel raising the question of spasm of the sphincter of Oddi. Ultrasound and CT scan, including the latter in May 26, 2010 demonstrated probable hemangioma in the left hepatic lobe on the medial dome, a second lesion in lateral segment of the left lobe and a third lesion in the right hepatic lobe all suggestive of hemangiomas. LFTs from April, 2011 were entirely normal. Endoscopy one year ago showed mild nonerosive antritis. She apparently underwent colonoscopy about 5 years ago and this was normal.  GI review of systems is positive for frequent bowel movements which tend to be loose.    Review of Systems: Pertinent positive and negative review of systems were noted in the above HPI section. All other review of systems were otherwise negative.    Current Medications, Allergies, Past Medical History, Past Surgical History, Family History and Social History were reviewed in [facility] electronic medical record  Vital signs were reviewed in today's medical record. Physical Exam: General: Well developed , well nourished, no acute distress Head: Normocephalic and atraumatic Eyes:  sclerae  anicteric, EOMI Ears: Normal auditory acuity Mouth: No deformity or lesions Lungs: Clear throughout to auscultation Heart: Regular rate and rhythm; no murmurs, rubs or bruits Abdomen: Soft,and non distended. No masses, hepatosplenomegaly or hernias noted. Normal Bowel sounds. There is minimal tenderness to palpation in the right quadrant. Rectal:deferred Musculoskeletal: Symmetrical with no gross deformities  Pulses:  Normal pulses noted Extremities: No clubbing, cyanosis, edema or deformities noted Neurological: Alert oriented x 4, grossly nonfocal Psychological:  Alert and cooperative. Normal mood and affect

## 2010-06-12 ENCOUNTER — Other Ambulatory Visit: Payer: Self-pay | Admitting: Gastroenterology

## 2010-06-12 ENCOUNTER — Telehealth: Payer: Self-pay | Admitting: Gastroenterology

## 2010-06-12 NOTE — Telephone Encounter (Signed)
Called pharmacy for pt, it is ready for her to pick up

## 2010-06-12 NOTE — Telephone Encounter (Signed)
Called pharmacy and spoke with pharmacist. I called in  rx for hyosyamine 30 tablets..its only going to cost her 10$, Called pt to inform

## 2010-06-12 NOTE — Telephone Encounter (Signed)
Dr Arlyce Dice, Pt needs a substitute for Hyomax its to expensive for her, need an alternative

## 2010-06-16 ENCOUNTER — Ambulatory Visit (INDEPENDENT_AMBULATORY_CARE_PROVIDER_SITE_OTHER): Payer: Commercial Managed Care - PPO | Admitting: Internal Medicine

## 2010-06-16 ENCOUNTER — Ambulatory Visit: Payer: Commercial Managed Care - PPO

## 2010-06-16 DIAGNOSIS — Z01818 Encounter for other preprocedural examination: Secondary | ICD-10-CM

## 2010-07-17 ENCOUNTER — Ambulatory Visit: Payer: Commercial Managed Care - PPO | Admitting: Internal Medicine

## 2010-07-17 ENCOUNTER — Other Ambulatory Visit: Payer: Self-pay | Admitting: Obstetrics

## 2010-07-17 DIAGNOSIS — Z780 Asymptomatic menopausal state: Secondary | ICD-10-CM

## 2010-07-17 DIAGNOSIS — M76899 Other specified enthesopathies of unspecified lower limb, excluding foot: Secondary | ICD-10-CM

## 2010-07-21 ENCOUNTER — Encounter: Payer: Self-pay | Admitting: Cardiovascular Disease

## 2010-07-21 ENCOUNTER — Ambulatory Visit (INDEPENDENT_AMBULATORY_CARE_PROVIDER_SITE_OTHER): Payer: Commercial Managed Care - PPO | Admitting: Cardiovascular Disease

## 2010-07-21 DIAGNOSIS — R002 Palpitations: Secondary | ICD-10-CM

## 2010-07-21 DIAGNOSIS — Z01810 Encounter for preprocedural cardiovascular examination: Secondary | ICD-10-CM

## 2010-07-21 DIAGNOSIS — R609 Edema, unspecified: Secondary | ICD-10-CM

## 2010-07-21 DIAGNOSIS — R079 Chest pain, unspecified: Secondary | ICD-10-CM

## 2010-07-21 DIAGNOSIS — I10 Essential (primary) hypertension: Secondary | ICD-10-CM

## 2010-07-21 DIAGNOSIS — R9431 Abnormal electrocardiogram [ECG] [EKG]: Secondary | ICD-10-CM

## 2010-07-21 NOTE — Assessment & Plan Note (Signed)
Mild dependant edema LLE  Continue PRN diuretic

## 2010-07-21 NOTE — Patient Instructions (Signed)
Your physician has requested that you have a stress echocardiogram. For further information please visit [email or web address]. Please follow instruction sheet as given.   

## 2010-07-21 NOTE — Assessment & Plan Note (Signed)
Well controlled.  Continue current medications and low sodium Dash type diet.  .  Weight loss helped.

## 2010-07-21 NOTE — Progress Notes (Signed)
65 yo referred by Dr Lynnae Prude, and Dr Onalee Hua plastic surgeon in [place].  Preop ECG abnormal.  Previously seen by Dr Paulette Blanch in [place] group.  I reviewed ECG;s from 2008, 2010 and WS 06/16/10 as well as in our office today. There is no significant changes.  Patient has had RSR' and nonspecific ST/T wave changes especially in lateral leads.  She denies SSCP, has had anxiety and occasional palpitations since plastic surgery cancelled.  She has previous face lift by another surgeon in this group with poor asthetic results.  Revision cannot be done until she has a stress test per anesthesia. No other significant CRF;s.  Discussed alternative testing.  Prefer stress echo as there is no radiation involved  ROS: Denies fever, malais, weight loss, blurry vision, decreased visual acuity, cough, sputum, SOB, hemoptysis, pleuritic pain, palpitaitons, heartburn, abdominal pain, melena, , claudication, or rash.  All other systems reviewed and negative   General: Affect appropriate Healthy:  appears stated age HEENT: normal congential "cyst" abnormality sclera of left eye Neck supple with no adenopathy JVP normal no bruits no thyromegaly Lungs clear with no wheezing and good diaphragmatic motion Heart:  S1/S2 no murmur,rub, gallop or click PMI normal Abdomen: benighn, BS positve, no tenderness, no AAA no bruit.  No HSM or HJR Distal pulses intact with no bruits Plus one LLE edema Neuro non-focal Skin warm and dry No muscular weakness  Medications Current Outpatient Prescriptions  Medication Sig Dispense Refill  . Biotin 2500 MCG CAPS Take 1 capsule by mouth daily.        . calcium carbonate 200 MG capsule Take 250 mg by mouth daily.       . cholecalciferol (VITAMIN D) 1000 UNITS tablet Take 2,000 Units by mouth daily.       . diazepam (VALIUM) 5 MG tablet Take 5 mg by mouth as needed.        Marland Kitchen esomeprazole (NEXIUM) 40 MG capsule Take 40 mg by mouth. 1 cap 3 times weekly      . furosemide (LASIX) 40 MG  tablet Take 40 mg by mouth once a week. 1 time weekly      . potassium chloride (KLOR-CON) 10 MEQ CR tablet Take 10 mEq by mouth once a week. 1 time weekly      . vitamin C (ASCORBIC ACID) 500 MG tablet Take 500 mg by mouth daily.          Allergies Review of patient's allergies indicates no known allergies.  Family History: Family History  Problem Relation Age of Onset  . Hypertension Other   . Thyroid cancer Mother   . Heart disease Mother   . Macular degeneration Mother   . Glaucoma Father   . Colon cancer Neg Hx     Social History: History   Social History  . Marital Status: Married    Spouse Name: N/A    Number of Children: 2  . Years of Education: N/A   Occupational History  . Engineer, agricultural    Social History Main Topics  . Smoking status: Never Smoker   . Smokeless tobacco: Not on file  . Alcohol Use: No  . Drug Use: No  . Sexually Active: Not on file   Other Topics Concern  . Not on file   Social History Narrative  . No narrative on file    Electrocardiogram: see HPI 4 ECG;s reviewed 2008, 2010, 06/16/10 and today.  NSR RSR;s and nonspecific  ST/T wave changes  Assessment and Plan

## 2010-07-21 NOTE — Assessment & Plan Note (Signed)
Atypical only after told surgery cancelled with benign palpitations.  Chronic nonspecific ECG changes.  F/U stress echo and should be able to clear for plastic surgery

## 2010-07-27 ENCOUNTER — Ambulatory Visit (HOSPITAL_BASED_OUTPATIENT_CLINIC_OR_DEPARTMENT_OTHER): Payer: Commercial Managed Care - PPO | Admitting: Radiology

## 2010-07-27 ENCOUNTER — Other Ambulatory Visit: Payer: Self-pay | Admitting: Obstetrics

## 2010-07-27 ENCOUNTER — Ambulatory Visit (HOSPITAL_COMMUNITY): Payer: Commercial Managed Care - PPO | Attending: Cardiovascular Disease | Admitting: Radiology

## 2010-07-27 DIAGNOSIS — I10 Essential (primary) hypertension: Secondary | ICD-10-CM | POA: Insufficient documentation

## 2010-07-27 DIAGNOSIS — R9431 Abnormal electrocardiogram [ECG] [EKG]: Secondary | ICD-10-CM

## 2010-07-27 DIAGNOSIS — Z1231 Encounter for screening mammogram for malignant neoplasm of breast: Secondary | ICD-10-CM

## 2010-07-27 DIAGNOSIS — R072 Precordial pain: Secondary | ICD-10-CM | POA: Insufficient documentation

## 2010-07-27 DIAGNOSIS — R0989 Other specified symptoms and signs involving the circulatory and respiratory systems: Secondary | ICD-10-CM

## 2010-07-27 DIAGNOSIS — R002 Palpitations: Secondary | ICD-10-CM

## 2010-07-27 DIAGNOSIS — Z01810 Encounter for preprocedural cardiovascular examination: Secondary | ICD-10-CM

## 2010-07-27 DIAGNOSIS — M7989 Other specified soft tissue disorders: Secondary | ICD-10-CM | POA: Insufficient documentation

## 2010-07-30 ENCOUNTER — Encounter: Payer: Self-pay | Admitting: Emergency Medicine

## 2010-07-30 ENCOUNTER — Telehealth: Payer: Self-pay | Admitting: Internal Medicine

## 2010-07-30 DIAGNOSIS — G4700 Insomnia, unspecified: Secondary | ICD-10-CM

## 2010-07-30 MED ORDER — ESZOPICLONE 3 MG PO TABS: 3.0000 mg | ORAL_TABLET | Freq: Every evening | ORAL | Status: DC | PRN

## 2010-07-30 NOTE — Telephone Encounter (Signed)
LMOVM at [facility] with Alfonso Patten script per DDS

## 2010-07-30 NOTE — Telephone Encounter (Signed)
Pt called stating she is having sleeping issues.  She gets about 3 hours sleep at night.  Would like something called in to pharmacy.  Does not do well with Ambien.  Her call back number is [phone].

## 2010-07-30 NOTE — Telephone Encounter (Signed)
LMOVM for pt, script called in to pharmacy

## 2010-07-30 NOTE — Telephone Encounter (Signed)
Spoke with pt.  She states that she has chronic sleep problems which have been worsening over the last couple of months.  She states she typically goes to bed around 11pm, does not go to sleep until 12-1am.  She will "jerk" out of bed around 3 hours later.  She is then unable to go back to sleep.  She states she is "exhausted and I am making mistakes".  She reports she is under immense personal stressors, financial stressors.  She does snore, wakes gasping for breath.  She has never had sleep study before.  She did see her OB/Gyn 2 weeks ago Education officer, museum) who is going to set her up for sleep study.  She is very concerned about being able to sleep.  At this point, she is having so much anxiety about NOT being able to sleep, she does not even want to go to bed.  She would like to know if DDS will call in something to help her sleep until she can get sleep study done.  Has tried Ambien before which did not help.  "It did not knock me out".  Pt also reports she "sleep ate" on Ambien.

## 2010-07-30 NOTE — Telephone Encounter (Signed)
OK to call in [place].  See med order

## 2010-08-13 ENCOUNTER — Encounter (HOSPITAL_BASED_OUTPATIENT_CLINIC_OR_DEPARTMENT_OTHER): Payer: Commercial Managed Care - PPO

## 2010-09-01 ENCOUNTER — Ambulatory Visit
Admission: RE | Admit: 2010-09-01 | Discharge: 2010-09-01 | Disposition: A | Discharge status: Home / Self Care | Payer: Commercial Managed Care - PPO | Source: Ambulatory Visit | Attending: Obstetrics | Admitting: Obstetrics

## 2010-09-01 DIAGNOSIS — Z1231 Encounter for screening mammogram for malignant neoplasm of breast: Secondary | ICD-10-CM

## 2010-09-01 DIAGNOSIS — Z780 Asymptomatic menopausal state: Secondary | ICD-10-CM

## 2010-09-01 DIAGNOSIS — M76899 Other specified enthesopathies of unspecified lower limb, excluding foot: Secondary | ICD-10-CM

## 2010-09-04 ENCOUNTER — Ambulatory Visit (HOSPITAL_BASED_OUTPATIENT_CLINIC_OR_DEPARTMENT_OTHER): Payer: Commercial Managed Care - PPO | Attending: Obstetrics

## 2010-09-04 DIAGNOSIS — I491 Atrial premature depolarization: Secondary | ICD-10-CM | POA: Insufficient documentation

## 2010-09-04 DIAGNOSIS — G4700 Insomnia, unspecified: Secondary | ICD-10-CM | POA: Insufficient documentation

## 2010-09-05 DIAGNOSIS — R0609 Other forms of dyspnea: Secondary | ICD-10-CM

## 2010-09-05 DIAGNOSIS — R0989 Other specified symptoms and signs involving the circulatory and respiratory systems: Secondary | ICD-10-CM

## 2010-09-05 DIAGNOSIS — G4710 Hypersomnia, unspecified: Secondary | ICD-10-CM

## 2010-09-05 DIAGNOSIS — G4730 Sleep apnea, unspecified: Secondary | ICD-10-CM

## 2010-09-10 NOTE — Procedures (Signed)
NAME:  Rhonda Henry, Rhonda Henry             ACCOUNT NO.:  [id number]  MEDICAL RECORD NO.:  [id number]          PATIENT TYPE:  OUT  LOCATION:  SLEEP CENTER                 FACILITY:  [facility]  PHYSICIAN:  Clinton D. Maple Hudson, MD, FCCP, FACPDATE OF BIRTH:  05/24[birth year]1945  DATE OF STUDY:  09/04/2010                           NOCTURNAL POLYSOMNOGRAM  REFERRING PHYSICIAN:  Lendon Colonel, MD  INDICATIONS FOR STUDY:  Insomnia with sleep apnea.  EPWORTH SLEEPINESS SCORE:  2/24.  BMI 31.6.  Weight 167 pounds, height 61 inches.  Neck 13 inches.  HOME MEDICATIONS:  Charted and reviewed.  SLEEP ARCHITECTURE:  Total sleep time 331.5 minutes with sleep efficiency 79.8%.  Stage I 9.4%, stage II 75.6%, stage III absent, REM 15.1% of total sleep time.  Sleep latency 5 minutes, REM latency 199.5 minutes, awake after sleep onset 78 minutes, arousal index 28.6.  BEDTIME MEDICATION:  None.  RESPIRATORY DATA:  Apnea/hypopnea index (AHI) 1.3 per hour.  A total of 7 events was scored, all as hypopneas and mostly associated with supine sleep position.  REM AHI 8.4 per hour.  RDI 10.5.  There were insufficient numbers of events to permit application of the split protocol CPAP titration on this study night.  OXYGEN DATA:  Mild to moderate intermittent snoring with oxygen desaturation to a nadir of 85% and a mean oxygen saturation through the study of 92.3% on room air.  CARDIAC DATA:  Sinus rhythm with PACs.  MOVEMENT/PARASOMNIA:  A few limb jerks were noted with insignificant impact on sleep.  Bathroom x1.  IMPRESSION/RECOMMENDATIONS: 1. Unremarkable sleep architecture for sleep center environment     without medication. 2. A few respiratory events with sleep disordered breathing, within     normal limits.  AHI 1.3 per hour (normal     range for adults between 0 and 5 per hour).  Mild-to-moderate     intermittent snoring with oxygen desaturation to a nadir of 85% and     a mean oxygen saturation through the  study of 92.3% on room air.     Clinton D. Maple Hudson, MD, [facility], FACP Diplomate, Biomedical engineer of Sleep Medicine Electronically Signed    CDY/MEDQ  D:  09/05/2010 12:01:22  T:  09/05/2010 96:29:52  Job:  [id number]

## 2010-11-03 ENCOUNTER — Ambulatory Visit: Payer: Commercial Managed Care - PPO | Admitting: Internal Medicine

## 2010-11-05 ENCOUNTER — Encounter: Payer: Self-pay | Admitting: Internal Medicine

## 2010-11-05 ENCOUNTER — Ambulatory Visit (INDEPENDENT_AMBULATORY_CARE_PROVIDER_SITE_OTHER): Payer: Commercial Managed Care - PPO | Admitting: Internal Medicine

## 2010-11-05 VITALS — BP 138/84 | HR 69 | Temp 98.1°F | Ht 61.0 in | Wt 169.0 lb

## 2010-11-05 DIAGNOSIS — F411 Generalized anxiety disorder: Secondary | ICD-10-CM

## 2010-11-05 DIAGNOSIS — IMO0002 Reserved for concepts with insufficient information to code with codable children: Secondary | ICD-10-CM

## 2010-11-05 DIAGNOSIS — F419 Anxiety disorder, unspecified: Secondary | ICD-10-CM

## 2010-11-05 DIAGNOSIS — M5410 Radiculopathy, site unspecified: Secondary | ICD-10-CM

## 2010-11-05 MED ORDER — DIAZEPAM 5 MG PO TABS: 5.0000 mg | ORAL_TABLET | Freq: Two times a day (BID) | ORAL | Status: DC | PRN

## 2010-11-05 NOTE — Progress Notes (Signed)
Subjective:    Patient ID: Rhonda Henry, female    DOB: 05-Oct-1945, 65 y.o.   MRN: [id number]  HPI Here for acute visit. Had recently seen Dr. Eduard Clos for L5-S1 radiculitis and had steroid injection.  Some improvement.  She notes symptoms of increasing anxiety when severe will feel like "skin is crawling all over."  Feel nervous at times   Tried Paxil but had only been on for 2 days.  Valium at night helps - she was given this by Dr. Donell Beers who felt she could not sleep due to anxiety  She is very anxious over upcoming facial surgery for plastic repair of facelift.  Not sure if she should have it done  No Known Allergies Past Medical History  Diagnosis Date  . Nontoxic multinodular goiter   . Other voice and resonance disorders   . Acute sinusitis, unspecified   . Esophageal reflux   . Goiter, unspecified   . Cramp of limb   . Lumbago   . Burn of unspecified degree of forearm   . Persistent disorder of initiating or maintaining sleep   . Personal history of unspecified urinary disorder   . Other chronic pain   . Unspecified essential hypertension   . Depressive disorder, not elsewhere classified   . Anxiety state, unspecified   . Antritis (stomach)   . Arthritis   . Menopause   . Paresthesia     R facial, s/p facelift   Past Surgical History  Procedure Date  . Facial cosmetic surgery 7-10    compl by thick scars and a scar necrosis on R cheek  . Breast biopsies   . Breast lumpectomy     x 2  . Refractive surgery 1997   History   Social History  . Marital Status: Married    Spouse Name: N/A    Number of Children: 2  . Years of Education: N/A   Occupational History  . Engineer, agricultural    Social History Main Topics  . Smoking status: Never Smoker   . Smokeless tobacco: Never Used  . Alcohol Use: Yes     socially  . Drug Use: No  . Sexually Active: Yes    Birth Control/ Protection: Other-see comments   Other Topics Concern  . Not on file   Social  History Narrative  . No narrative on file   Family History  Problem Relation Age of Onset  . Hypertension Other   . Thyroid cancer Mother   . Heart disease Mother   . Macular degeneration Mother   . Glaucoma Father   . Colon cancer Neg Hx    Patient Active Problem List  Diagnoses  . GOITER, UNSPECIFIED  . THYROID NODULE  . GOITER, MULTINODULAR  . VITAMIN D DEFICIENCY  . ANXIETY  . INSOMNIA, PERSISTENT  . DEPRESSION  . PAIN, CHRONIC NEC  . HYPERTENSION  . SINUSITIS- ACUTE-NOS  . GERD  . SCAR  . LOW BACK PAIN  . CRAMPS,LEG  . FACIAL PARESTHESIA, RIGHT  . HOARSENESS  . CHEST PAIN  . ABDOMINAL PAIN  . RUQ PAIN  . BURN OF UNSPECIFIED DEGREE OF FOREARM  . UTI'S, HX OF  . Edema   Current Outpatient Prescriptions on File Prior to Visit  Medication Sig Dispense Refill  . Biotin 2500 MCG CAPS Take 1 capsule by mouth daily.        . calcium carbonate 200 MG capsule Take 250 mg by mouth daily.       Marland Kitchen  cholecalciferol (VITAMIN D) 1000 UNITS tablet Take 2,000 Units by mouth daily.       . furosemide (LASIX) 40 MG tablet Take 40 mg by mouth once a week. 1 time weekly      . potassium chloride (KLOR-CON) 10 MEQ CR tablet Take 10 mEq by mouth once a week. 1 time weekly      . vitamin C (ASCORBIC ACID) 500 MG tablet Take 500 mg by mouth daily.        Marland Kitchen esomeprazole (NEXIUM) 40 MG capsule Take 40 mg by mouth. 1 cap 3 times weekly      . Eszopiclone (ESZOPICLONE) 3 MG TABS Take 1 tablet (3 mg total) by mouth at bedtime as needed. Take immediately before bedtime  10 tablet  1  . RE CHLORDIAZEPOXIDE/CLIDINIUM 5-2.5 MG per capsule TAKE ONE CAPSULE BY MOUTH FOUR TIMES DAILY AS NEEDED FOR SPASMS IN STOMACH  60 capsule  0       Review of Systems    see HPI Objective:   Physical Exam Physical Exam  Nursing note and vitals reviewed.  Constitutional: She is oriented to person, place, and time. She appears well-developed and well-nourished.  HENT:  Head: Normocephalic and atraumatic.    Cardiovascular: Normal rate and regular rhythm. Exam reveals no gallop and no friction rub.  No murmur heard.  Pulmonary/Chest: Breath sounds normal. She has no wheezes. She has no rales.  Neurological: She is alert and oriented to person, place, and time.  Skin: Skin is warm and dry.  Psychiatric: She has a normal mood and affect. Her behavior is normal.           Assessment & Plan:  1)  Anxiety  Will give Rx:  Valium 5 mg #30 1/2 to 1 tab bid prn anxiety  OK to stop Paxil  Return as needed 2)  L5-S1 radiculitis  Recent steroid injection with Dr. Eduard Clos.   Pt has not had Tdap and she states she will get this at her medical office

## 2010-11-05 NOTE — Patient Instructions (Signed)
Get Tdap  Vaccine at your office  Take Valium bid as needed  Low dose

## 2010-12-14 ENCOUNTER — Other Ambulatory Visit: Payer: Self-pay | Admitting: Internal Medicine

## 2010-12-15 NOTE — Telephone Encounter (Signed)
Dr. Debby Bud is it ok to refill antibiotic?...12/15/10@9 :39am/LMB

## 2010-12-16 NOTE — Telephone Encounter (Signed)
Md ok antibiotic & sent to pharmacy...12/16/10@8 :26am/LMB

## 2011-02-09 ENCOUNTER — Telehealth: Payer: Self-pay | Admitting: Internal Medicine

## 2011-02-09 NOTE — Telephone Encounter (Signed)
Rhonda Henry, DOB 2045-06-02, called from  [phone] stating that she has been having left sided head pains for a couple of months.  They are getting a lot worse and she is now thinking she should have an MRI or something.  Does she need to come in or can we go ahead and schedule MRI?

## 2011-02-09 NOTE — Telephone Encounter (Signed)
Spoke with [employer].  She states that for the last 2 months, she has been waking in the middle of the night with "excruciating, stabbing pain" on the left side of her head just above her ear.  It does not happen every night and there is no pattern to when it will happen or not.  She states the pain only lasts for a minute or two, but she is unable to do anything else when it occurs.  She did have a similar episode of this type of pain about 5 yrs ago, was worked up by her PCP at the time with an MRI which was normal per her report.  She states that she is concerned about one other symptom which is that she feels like "I am losing brain power".  She states that she can not remember simple spelling words, is having to spell check her work, is a Clinical research associate for a living.  She is also not remembering things like she used to.  Would like to have a repeat MRI.  Is aware that she would need to come in for a physical assessment, then allow DDS to determine the best course of action for imaging first.  She is agreeable.  I offered her an appointment today, but she declined stating she has to take her husband to [facility] for eye surgery.  I offered her an appointment on Thursday which she also declined.  Scheduled an appt for Tuesday 2/12 @ 830am.  I advised her of stroke symptoms and should they occur to seek immediate medical attention.  I also advised her if her symptoms worsen to call the office for a sooner appointment.  She is agreeable

## 2011-02-16 ENCOUNTER — Other Ambulatory Visit: Payer: Self-pay | Admitting: Emergency Medicine

## 2011-02-16 ENCOUNTER — Telehealth: Payer: Self-pay | Admitting: Internal Medicine

## 2011-02-16 ENCOUNTER — Ambulatory Visit (INDEPENDENT_AMBULATORY_CARE_PROVIDER_SITE_OTHER): Payer: Commercial Managed Care - PPO | Admitting: Internal Medicine

## 2011-02-16 ENCOUNTER — Encounter: Payer: Self-pay | Admitting: Internal Medicine

## 2011-02-16 VITALS — BP 136/74 | HR 76 | Temp 98.3°F | Resp 12 | Ht 61.0 in | Wt 177.0 lb

## 2011-02-16 DIAGNOSIS — S0990XA Unspecified injury of head, initial encounter: Secondary | ICD-10-CM

## 2011-02-16 DIAGNOSIS — R55 Syncope and collapse: Secondary | ICD-10-CM

## 2011-02-16 DIAGNOSIS — E042 Nontoxic multinodular goiter: Secondary | ICD-10-CM

## 2011-02-16 DIAGNOSIS — R51 Headache: Secondary | ICD-10-CM

## 2011-02-16 LAB — COMPREHENSIVE METABOLIC PANEL
Alkaline Phosphatase: 77 U/L (ref 39–117)
BUN: 22 mg/dL (ref 6–23)
Creat: 0.82 mg/dL (ref 0.50–1.10)
Glucose, Bld: 61 mg/dL — ABNORMAL LOW (ref 70–99)
Sodium: 142 mEq/L (ref 135–145)
Total Bilirubin: 0.8 mg/dL (ref 0.3–1.2)

## 2011-02-16 MED ORDER — POTASSIUM CHLORIDE ER 10 MEQ PO TBCR: EXTENDED_RELEASE_TABLET | ORAL | Status: DC

## 2011-02-16 MED ORDER — POTASSIUM CHLORIDE CRYS ER 20 MEQ PO TBCR: 20.0000 meq | EXTENDED_RELEASE_TABLET | Freq: Every day | ORAL | Status: DC

## 2011-02-16 MED ORDER — HYDROCHLOROTHIAZIDE 25 MG PO TABS: 25.0000 mg | ORAL_TABLET | Freq: Every day | ORAL | Status: DC

## 2011-02-16 MED ORDER — DIAZEPAM 5 MG PO TABS: 5.0000 mg | ORAL_TABLET | Freq: Two times a day (BID) | ORAL | Status: DC | PRN

## 2011-02-16 NOTE — Telephone Encounter (Signed)
Please call [facility] pharmacy on [address] and let  Them know that her K pill should be 10 meq and not 20 meq  thanks

## 2011-02-16 NOTE — Telephone Encounter (Signed)
Pt requested 90 day supply.  Pt only takes HCTZ once weekly and takes KCl with HCTZ

## 2011-02-16 NOTE — Telephone Encounter (Signed)
Spoke with [place] at [facility].  She will delete the script for the and fill script for 

## 2011-02-16 NOTE — Progress Notes (Signed)
Addended by: [name] on: 02/16/2011 12:56 PM   Modules accepted: Orders

## 2011-02-16 NOTE — Patient Instructions (Signed)
To have MRI on Thursday    Call office Monday for results

## 2011-02-16 NOTE — Progress Notes (Signed)
Subjective:    Patient ID: Rhonda Henry, female    DOB: [birth year], 66 y.o.   MRN: [id number]  HPI  [name] is here with an acute concern.  Aprprox 3 months ago she fell in the parking lot of her workplace.  She believes she had LOC as she does not remember falling. She believes she hit her head when falling.   Awoke with knee injuries.  Was seen at [facility] and told orthopedic injuries were sprains.  Also evaluated by [facility] worker's comp the next day and reports she told them about hitting her head and not remembering the fall but they "only covered the knee injury".    Since head injury incident has had daily headaches usually relieved by Tylenol.  She also notes stabbing headache pain that wakes her a night located in L parietal area.  Stabbing pain similar to headache pain in 2004 with neg MRI at that time.  Was evaluated by Dr. Sandria Manly at that time.  No N/V now no visual changes, no speech changes, no numbness tingling, or muscle weakness currently. She denies any further episode of LOC.  No dizziness, chest pain or palpitations at time of fall.    Would also like to change her Lasix to HCTZ.  Only uses diuretic maybe once a month  No Known Allergies Past Medical History  Diagnosis Date  . Nontoxic multinodular goiter   . Other voice and resonance disorders   . Acute sinusitis, unspecified   . Esophageal reflux   . Goiter, unspecified   . Cramp of limb   . Lumbago   . Burn of unspecified degree of forearm   . Persistent disorder of initiating or maintaining sleep   . Personal history of unspecified urinary disorder   . Other chronic pain   . Unspecified essential hypertension   . Depressive disorder, not elsewhere classified   . Anxiety state, unspecified   . Antritis (stomach)   . Arthritis   . Menopause   . Paresthesia     R facial, s/p facelift   Past Surgical History  Procedure Date  . Facial cosmetic surgery 7-10    compl by thick scars and a scar necrosis on R  cheek  . Breast biopsies   . Breast lumpectomy     x 2  . Refractive surgery 1997   History   Social History  . Marital Status: Married    Spouse Name: N/A    Number of Children: 2  . Years of Education: N/A   Occupational History  . Engineer, agricultural    Social History Main Topics  . Smoking status: Never Smoker   . Smokeless tobacco: Never Used  . Alcohol Use: Yes     socially  . Drug Use: No  . Sexually Active: Yes    Birth Control/ Protection: Other-see comments   Other Topics Concern  . Not on file   Social History Narrative  . No narrative on file   Family History  Problem Relation Age of Onset  . Hypertension Other   . Thyroid cancer Mother   . Heart disease Mother   . Macular degeneration Mother   . Glaucoma Father   . Colon cancer Neg Hx    Patient Active Problem List  Diagnoses  . GOITER, UNSPECIFIED  . THYROID NODULE  . GOITER, MULTINODULAR  . VITAMIN D DEFICIENCY  . ANXIETY  . INSOMNIA, PERSISTENT  . DEPRESSION  . PAIN, CHRONIC NEC  . HYPERTENSION  . SINUSITIS-  ACUTE-NOS  . GERD  . SCAR  . LOW BACK PAIN  . CRAMPS,LEG  . FACIAL PARESTHESIA, RIGHT  . HOARSENESS  . CHEST PAIN  . ABDOMINAL PAIN  . RUQ PAIN  . BURN OF UNSPECIFIED DEGREE OF FOREARM  . UTI'S, HX OF  . Edema   Current Outpatient Prescriptions on File Prior to Visit  Medication Sig Dispense Refill  . Biotin 2500 MCG CAPS Take 1 capsule by mouth daily.        . calcium carbonate 200 MG capsule Take 250 mg by mouth daily.       . cholecalciferol (VITAMIN D) 1000 UNITS tablet Take 2,000 Units by mouth daily.       Marland Kitchen PARoxetine (PAXIL) 10 MG tablet Take 10 mg by mouth every morning.        . potassium chloride (KLOR-CON) 10 MEQ CR tablet Take 10 mEq by mouth once a week. 1 time weekly      . RE CHLORDIAZEPOXIDE/CLIDINIUM 5-2.5 MG per capsule TAKE ONE CAPSULE BY MOUTH FOUR TIMES DAILY AS NEEDED FOR SPASMS IN STOMACH  60 capsule  0  . sulfamethoxazole-trimethoprim (BACTRIM DS)  800-160 MG per tablet TAKE 1 TABLET BY MOUTH PRE OR POST COITAL TO PREVENT CYCSTITIS  14 tablet  1  . vitamin C (ASCORBIC ACID) 500 MG tablet Take 500 mg by mouth daily.        Marland Kitchen esomeprazole (NEXIUM) 40 MG capsule Take 40 mg by mouth. 1 cap 3 times weekly      . Eszopiclone (ESZOPICLONE) 3 MG TABS Take 1 tablet (3 mg total) by mouth at bedtime as needed. Take immediately before bedtime  10 tablet  1         Review of Systems See HPI    Objective:   Physical Exam Physical Exam  Nursing note and vitals reviewed.  Constitutional: She is oriented to person, place, and time. She appears well-developed and well-nourished.  HENT:  Head: Normocephalic and atraumatic. Fndoscopic unable to visuaize fundii due to pupil constriction Cardiovascular: Normal rate and regular rhythm. Exam reveals no gallop and no friction rub.  No murmur heard.  Pulmonary/Chest: Breath sounds normal. She has no wheezes. She has no rales.  Neurological: She is alert and oriented to person, place, and time.  CN II-Xii intact  Motor 5/5 all groups tested.  Cerebellar intact FTN  Sensory intact, symmetric  Reflexes 2+ symmetric  Grossly nonfaocal Skin: Skin is warm and dry.  Psychiatric: She has a normal mood and affect. Her behavior is normal.        Assessment & Plan:  1)  Head injury post trauma headaches.  Will get MRI with and without  OTC for pain 2)  INsomnia will refill Valium 3)  OK to change to HCTZ  Advised to take klor-con pill when she uses HCTZ 4)  ??? Syncope   EKG non specific T wave change otherwise no acute changes 5)  H/po mulitnodular goiter:  Has seen Dr. Everardo All for this  See u/S 2010

## 2011-02-18 ENCOUNTER — Encounter: Payer: Self-pay | Admitting: Emergency Medicine

## 2011-02-22 ENCOUNTER — Telehealth: Payer: Self-pay | Admitting: Emergency Medicine

## 2011-02-22 NOTE — Telephone Encounter (Signed)
Left message on pt cell phone to return call regarding  MRI

## 2011-02-22 NOTE — Telephone Encounter (Signed)
Rhonda Henry called, would like results of MRI.  States staff at [facility] were unable to get IV for contrast, so study was done without contrast.  Report on your desk.

## 2011-02-23 ENCOUNTER — Telehealth: Payer: Self-pay | Admitting: Internal Medicine

## 2011-02-23 NOTE — Telephone Encounter (Signed)
Spoke with pt. And informed of normal MRi w/o contrast.  Apparently they could not start an IV for contrast.   Pt still has headaches.  She is a pt of Dr. Sandria Manly and counseled to make appt with him for evaluation.  She voices understanding and states she will call

## 2011-03-01 ENCOUNTER — Telehealth: Payer: Self-pay | Admitting: Emergency Medicine

## 2011-03-01 DIAGNOSIS — S0990XA Unspecified injury of head, initial encounter: Secondary | ICD-10-CM

## 2011-03-01 NOTE — Telephone Encounter (Signed)
Marcelino Duster called, stated pt is scheduled for MRI brain with contrast only tomorrow at [facility].  She needs order faxed to [phone] today for same.  Order entered, will fax to them pending DDS signature.

## 2011-03-01 NOTE — Telephone Encounter (Signed)
Rhonda Henry is scheduled for MRI brain with contrast only tomorrow.  [facility] needs order to reflect faxed to [phone]

## 2011-03-01 NOTE — Telephone Encounter (Signed)
Order faxed.

## 2011-05-07 ENCOUNTER — Telehealth: Payer: Self-pay | Admitting: Gastroenterology

## 2011-05-07 NOTE — Telephone Encounter (Signed)
Left message for pt to call back  °

## 2011-05-10 NOTE — Telephone Encounter (Signed)
Left message for pt to call back  °

## 2011-05-11 NOTE — Telephone Encounter (Signed)
Left message for pt to call back. Unable to reach pt. 

## 2011-05-20 ENCOUNTER — Encounter: Payer: Self-pay | Admitting: Internal Medicine

## 2011-05-20 ENCOUNTER — Ambulatory Visit (INDEPENDENT_AMBULATORY_CARE_PROVIDER_SITE_OTHER): Payer: Medicare Other | Admitting: Internal Medicine

## 2011-05-20 VITALS — BP 130/70 | HR 68 | Temp 97.4°F | Resp 16 | Ht 61.5 in | Wt 184.0 lb

## 2011-05-20 DIAGNOSIS — X58XXXS Exposure to other specified factors, sequela: Secondary | ICD-10-CM

## 2011-05-20 DIAGNOSIS — G44309 Post-traumatic headache, unspecified, not intractable: Secondary | ICD-10-CM

## 2011-05-20 DIAGNOSIS — H538 Other visual disturbances: Secondary | ICD-10-CM

## 2011-05-20 MED ORDER — SULFAMETHOXAZOLE-TMP DS 800-160 MG PO TABS: ORAL_TABLET | ORAL | Status: DC

## 2011-05-20 MED ORDER — POTASSIUM CHLORIDE ER 10 MEQ PO TBCR: EXTENDED_RELEASE_TABLET | ORAL | Status: DC

## 2011-05-20 MED ORDER — ACETAMINOPHEN-CODEINE #3 300-30 MG PO TABS: ORAL_TABLET | ORAL | Status: DC

## 2011-05-20 NOTE — Patient Instructions (Signed)
Will refer to dr. Vickey Huger

## 2011-05-20 NOTE — Progress Notes (Signed)
Subjective:    Patient ID: Rhonda Henry, female    DOB: [birth year], 66 y.o.   MRN: [id number]  HPI Rhonda Henry is here to follow up on her headaches.  She continues to have nearly daily headaches that involve the L temporal area associated with numbness in L side of face and "foggy" vision. No diploplia or double vision.  She take Tylenol for pain.  Of not she had a non-contrast MRi in February which was unremarkable.  She declined to have contrast which was what I ordered originally.  I also advised her to see her neurologist Dr. Sandria Manly which she did not do.    No Known Allergies Past Medical History  Diagnosis Date  . Nontoxic multinodular goiter   . Other voice and resonance disorders   . Acute sinusitis, unspecified   . Esophageal reflux   . Goiter, unspecified   . Cramp of limb   . Lumbago   . Burn of unspecified degree of forearm   . Persistent disorder of initiating or maintaining sleep   . Personal history of unspecified urinary disorder   . Other chronic pain   . Unspecified essential hypertension   . Depressive disorder, not elsewhere classified   . Anxiety state, unspecified   . Antritis (stomach)   . Arthritis   . Menopause   . Paresthesia     R facial, s/p facelift   Past Surgical History  Procedure Date  . Facial cosmetic surgery 7-10    compl by thick scars and a scar necrosis on R cheek  . Breast biopsies   . Breast lumpectomy     x 2  . Refractive surgery 1997   History   Social History  . Marital Status: Married    Spouse Name: N/A    Number of Children: 2  . Years of Education: N/A   Occupational History  . Engineer, agricultural    Social History Main Topics  . Smoking status: Never Smoker   . Smokeless tobacco: Never Used  . Alcohol Use: Yes     socially  . Drug Use: No  . Sexually Active: Yes    Birth Control/ Protection: Other-see comments   Other Topics Concern  . Not on file   Social History Narrative  . No narrative on file   Family  History  Problem Relation Age of Onset  . Hypertension Other   . Thyroid cancer Mother   . Heart disease Mother   . Macular degeneration Mother   . Glaucoma Father   . Colon cancer Neg Hx    Patient Active Problem List  Diagnoses  . GOITER, UNSPECIFIED  . THYROID NODULE  . GOITER, MULTINODULAR  . VITAMIN D DEFICIENCY  . ANXIETY  . INSOMNIA, PERSISTENT  . DEPRESSION  . PAIN, CHRONIC NEC  . HYPERTENSION  . SINUSITIS- ACUTE-NOS  . GERD  . SCAR  . LOW BACK PAIN  . CRAMPS,LEG  . FACIAL PARESTHESIA, RIGHT  . HOARSENESS  . CHEST PAIN  . ABDOMINAL PAIN  . RUQ PAIN  . BURN OF UNSPECIFIED DEGREE OF FOREARM  . UTI'S, HX OF  . Edema   Current Outpatient Prescriptions on File Prior to Visit  Medication Sig Dispense Refill  . Biotin 2500 MCG CAPS Take 1 capsule by mouth daily.        . calcium carbonate 200 MG capsule Take 250 mg by mouth daily.       . cholecalciferol (VITAMIN D) 1000 UNITS tablet Take 2,000 Units  by mouth daily.       . diazepam (VALIUM) 5 MG tablet Take 1 tablet (5 mg total) by mouth every 12 (twelve) hours as needed for anxiety.  30 tablet  1  . hydrochlorothiazide (HYDRODIURIL) 25 MG tablet Take 1 tablet (25 mg total) by mouth daily.  90 tablet  3  . potassium chloride (KLOR-CON 10) 10 MEQ tablet Take only when taking a HCTZ pill  30 tablet  0  . RE CHLORDIAZEPOXIDE/CLIDINIUM 5-2.5 MG per capsule TAKE ONE CAPSULE BY MOUTH FOUR TIMES DAILY AS NEEDED FOR SPASMS IN STOMACH  60 capsule  0  . sulfamethoxazole-trimethoprim (BACTRIM DS) 800-160 MG per tablet TAKE 1 TABLET BY MOUTH PRE OR POST COITAL TO PREVENT CYCSTITIS  14 tablet  1  . vitamin C (ASCORBIC ACID) 500 MG tablet Take 500 mg by mouth daily.        Marland Kitchen esomeprazole (NEXIUM) 40 MG capsule Take 40 mg by mouth. 1 cap 3 times weekly      . Eszopiclone (ESZOPICLONE) 3 MG TABS Take 1 tablet (3 mg total) by mouth at bedtime as needed. Take immediately before bedtime  10 tablet  1  . PARoxetine (PAXIL) 10 MG tablet  Take 10 mg by mouth every morning.             Review of Systems See HPI    Objective:   Physical Exam  Physical Exam  Nursing note and vitals reviewed.  Constitutional: She is oriented to person, place, and time. She appears well-developed and well-nourished.  HENT:  Head: Normocephalic and atraumatic.  Cardiovascular: Normal rate and regular rhythm. Exam reveals no gallop and no friction rub.  No murmur heard.  Pulmonary/Chest: Breath sounds normal. She has no wheezes. She has no rales.  Neurological: She is alert and oriented to person, place, and time. CN II-xII intact except CN VII post face lift surgery Motor 5/5 all extremities Sensory intact to microfilament testiong Cerebellar intact FTN Reflexes 2+ symmetric  Skin: Skin is warm and dry.  Psychiatric: She has a normal mood and affect. Her behavior is normal.         Assessment & Plan:  1)  Persistant headaches :  Will refer to Dr. Vickey Huger for further eval.. OK for Tylenol #3 1 bid with colace or Dulcolax as needed 2)  She has had [facility]  Surgery and wishes to be referred to [facility]  This is fine with me

## 2011-05-26 ENCOUNTER — Other Ambulatory Visit: Payer: Self-pay | Admitting: Internal Medicine

## 2011-05-26 DIAGNOSIS — H538 Other visual disturbances: Secondary | ICD-10-CM

## 2011-07-05 ENCOUNTER — Telehealth: Payer: Self-pay | Admitting: *Deleted

## 2011-07-05 NOTE — Telephone Encounter (Signed)
Mervyn Gay can you get neurologists office note and set it on my desk.  thanks

## 2011-07-05 NOTE — Telephone Encounter (Signed)
Pt continues to have headaches and the neurologist says they are cluster headaches.  She wanted your opinion about something she and Cheryl(an ER MD) discussed.  She wonders if she could have giant cell artheritis.  She would like you to call her is possible.

## 2011-07-06 NOTE — Telephone Encounter (Signed)
Called [facility] to obtain office notes for Dr Lonell Face review.

## 2011-07-07 ENCOUNTER — Other Ambulatory Visit (INDEPENDENT_AMBULATORY_CARE_PROVIDER_SITE_OTHER): Payer: Medicare Other

## 2011-07-07 DIAGNOSIS — R51 Headache: Secondary | ICD-10-CM

## 2011-07-07 LAB — CBC WITH DIFFERENTIAL/PLATELET
Eosinophils Absolute: 0.3 10*3/uL (ref 0.0–0.7)
Eosinophils Relative: 4 % (ref 0–5)
Hemoglobin: 12.9 g/dL (ref 12.0–15.0)
Lymphs Abs: 1.9 10*3/uL (ref 0.7–4.0)
MCH: 27.2 pg (ref 26.0–34.0)
MCV: 81.6 fL (ref 78.0–100.0)
Monocytes Relative: 7 % (ref 3–12)
RBC: 4.74 MIL/uL (ref 3.87–5.11)

## 2011-07-07 LAB — SEDIMENTATION RATE: Sed Rate: 5 mm/hr (ref 0–22)

## 2011-07-12 ENCOUNTER — Telehealth: Payer: Self-pay | Admitting: *Deleted

## 2011-07-12 NOTE — Telephone Encounter (Signed)
Returned call to pt and left results of latest labwork on her cell phone.  A copy of labs also faxed per pt to [facility].

## 2011-08-23 ENCOUNTER — Other Ambulatory Visit: Payer: Self-pay | Admitting: Obstetrics

## 2011-08-23 ENCOUNTER — Other Ambulatory Visit: Payer: Self-pay | Admitting: Obstetrics & Gynecology

## 2011-08-23 DIAGNOSIS — Z1231 Encounter for screening mammogram for malignant neoplasm of breast: Secondary | ICD-10-CM

## 2011-09-08 ENCOUNTER — Ambulatory Visit
Admission: RE | Admit: 2011-09-08 | Discharge: 2011-09-08 | Disposition: A | Discharge status: Home / Self Care | Payer: Medicare Other | Source: Ambulatory Visit | Attending: Obstetrics & Gynecology | Admitting: Obstetrics & Gynecology

## 2011-09-08 DIAGNOSIS — Z1231 Encounter for screening mammogram for malignant neoplasm of breast: Secondary | ICD-10-CM

## 2011-10-14 ENCOUNTER — Other Ambulatory Visit: Payer: Self-pay | Admitting: Internal Medicine

## 2011-10-14 NOTE — Telephone Encounter (Signed)
Pt needs Diazepam (Tab) VALIUM 5 MG Take 1 tablet (5 mg total) by mouth every 12 (twelve) hours as needed for anxiety. To be refill and sent to [facility] off of [address]... If there are any questions please call her [phone]

## 2011-10-14 NOTE — Telephone Encounter (Signed)
Medication called in #30 with 1 refill per Dr Constance Goltz VO

## 2011-10-16 MED ORDER — DIAZEPAM 5 MG PO TABS: 5.0000 mg | ORAL_TABLET | Freq: Two times a day (BID) | ORAL | Status: DC | PRN

## 2012-01-03 ENCOUNTER — Other Ambulatory Visit: Payer: Self-pay | Admitting: *Deleted

## 2012-01-03 MED ORDER — POTASSIUM CHLORIDE ER 10 MEQ PO TBCR: EXTENDED_RELEASE_TABLET | ORAL | Status: DC

## 2012-01-03 NOTE — Telephone Encounter (Signed)
Refill request

## 2012-01-11 ENCOUNTER — Other Ambulatory Visit: Payer: Self-pay | Admitting: Internal Medicine

## 2012-01-11 MED ORDER — DIAZEPAM 5 MG PO TABS: 5.0000 mg | ORAL_TABLET | Freq: Two times a day (BID) | ORAL | Status: DC | PRN

## 2012-01-11 MED ORDER — SULFAMETHOXAZOLE-TMP DS 800-160 MG PO TABS: ORAL_TABLET | ORAL | Status: DC

## 2012-01-11 NOTE — Telephone Encounter (Signed)
Pt needs the following medications sent to thru mail by prescriptions to her: diazepam (VALIUM) 5 MG tablet sulfamethoxazole-trimethoprim (BACTRIM DS) 800-160 MG per tablet Hyoscyamine .375mg er  Pt states if there any questions please call her at [phone]... Pt states she has a new insurance and she is able get her medications thru mail order at a lower price.Marland Kitchen and for each medication she needs three months supply.Marland KitchenMarland Kitchen

## 2012-01-11 NOTE — Telephone Encounter (Signed)
Refill request see Ardenias note Will call in pendinf approval

## 2012-01-11 NOTE — Telephone Encounter (Signed)
Left message to return call awaiting return call 

## 2012-01-11 NOTE — Telephone Encounter (Signed)
Call pt back and let her know I do not give 90 days worth of benzos as they are Scheduled drug

## 2012-01-17 ENCOUNTER — Other Ambulatory Visit: Payer: Self-pay | Admitting: Internal Medicine

## 2012-01-17 ENCOUNTER — Other Ambulatory Visit: Payer: Self-pay | Admitting: *Deleted

## 2012-01-17 MED ORDER — HYOSCYAMINE SULFATE ER 0.375 MG PO TBCR: 0.3750 mg | EXTENDED_RELEASE_TABLET | Freq: Every day | ORAL | Status: DC

## 2012-01-17 NOTE — Telephone Encounter (Signed)
Pt is requesting written rx for her hyoscyamine, bK+ paxil  Bactrim

## 2012-01-17 NOTE — Telephone Encounter (Signed)
Pt called on Fri at 1142 am to see the status of her medications being sent to her thru the mail... She can be reach at [phone] please call and leave a detail message if she does not answer.. She needs to know if her medication prescriptions will be mailed to her or not .Marland KitchenMarland Kitchen

## 2012-01-17 NOTE — Telephone Encounter (Signed)
Pt request that we mail her a copy of her  rx so that she can change to prim[facility].

## 2012-01-24 ENCOUNTER — Other Ambulatory Visit: Payer: Self-pay | Admitting: *Deleted

## 2012-01-24 NOTE — Telephone Encounter (Signed)
[facility]

## 2012-02-04 ENCOUNTER — Other Ambulatory Visit: Payer: Self-pay | Admitting: Gastroenterology

## 2012-02-04 NOTE — Telephone Encounter (Signed)
Spoke with patient, she is faxing a list of preferred medications over to me for me to look at

## 2012-02-09 ENCOUNTER — Other Ambulatory Visit: Payer: Self-pay | Admitting: *Deleted

## 2012-02-09 MED ORDER — HYDROCHLOROTHIAZIDE 25 MG PO TABS: 25.0000 mg | ORAL_TABLET | Freq: Every day | ORAL | Status: DC

## 2012-02-09 NOTE — Telephone Encounter (Signed)
Refill request

## 2012-02-10 ENCOUNTER — Encounter: Payer: Self-pay | Admitting: Internal Medicine

## 2012-02-10 ENCOUNTER — Ambulatory Visit (INDEPENDENT_AMBULATORY_CARE_PROVIDER_SITE_OTHER): Payer: BC Managed Care – PPO | Admitting: Internal Medicine

## 2012-02-10 VITALS — BP 138/86 | HR 71 | Temp 97.6°F | Resp 18 | Wt 194.0 lb

## 2012-02-10 DIAGNOSIS — R590 Localized enlarged lymph nodes: Secondary | ICD-10-CM

## 2012-02-10 DIAGNOSIS — J069 Acute upper respiratory infection, unspecified: Secondary | ICD-10-CM

## 2012-02-10 DIAGNOSIS — R599 Enlarged lymph nodes, unspecified: Secondary | ICD-10-CM

## 2012-02-10 NOTE — Patient Instructions (Signed)
See me in 4 weeks 

## 2012-02-10 NOTE — Progress Notes (Signed)
Subjective:    Patient ID: Rhonda Henry, female    DOB: May 18, 1945, 67 y.o.   MRN: [id number]  HPI Tishia is here for acute visit.   She was seen at Urgent care 3 days ago with URI.  Given  Amoxicillin.  Feeling better now but ears full and very sore in neck.  Feels glands swollen   No Known Allergies Past Medical History  Diagnosis Date  . Nontoxic multinodular goiter   . Other voice and resonance disorders   . Acute sinusitis, unspecified   . Esophageal reflux   . Goiter, unspecified   . Cramp of limb   . Lumbago   . Burn of unspecified degree of forearm   . Persistent disorder of initiating or maintaining sleep   . Personal history of unspecified urinary disorder   . Other chronic pain   . Unspecified essential hypertension   . Depressive disorder, not elsewhere classified   . Anxiety state, unspecified   . Antritis (stomach)   . Arthritis   . Menopause   . Paresthesia     R facial, s/p facelift   Past Surgical History  Procedure Date  . Facial cosmetic surgery 7-10    compl by thick scars and a scar necrosis on R cheek  . Breast biopsies   . Breast lumpectomy     x 2  . Refractive surgery 1997   History   Social History  . Marital Status: Married    Spouse Name: N/A    Number of Children: 2  . Years of Education: N/A   Occupational History  . Engineer, agricultural    Social History Main Topics  . Smoking status: Never Smoker   . Smokeless tobacco: Never Used  . Alcohol Use: Yes     Comment: socially  . Drug Use: No  . Sexually Active: Yes    Birth Control/ Protection: Other-see comments   Other Topics Concern  . Not on file   Social History Narrative  . No narrative on file   Family History  Problem Relation Age of Onset  . Hypertension Other   . Thyroid cancer Mother   . Heart disease Mother   . Macular degeneration Mother   . Glaucoma Father   . Colon cancer Neg Hx    Patient Active Problem List  Diagnosis  . GOITER, UNSPECIFIED   . THYROID NODULE  . GOITER, MULTINODULAR  . VITAMIN D DEFICIENCY  . ANXIETY  . INSOMNIA, PERSISTENT  . DEPRESSION  . PAIN, CHRONIC NEC  . HYPERTENSION  . SINUSITIS- ACUTE-NOS  . GERD  . SCAR  . LOW BACK PAIN  . CRAMPS,LEG  . FACIAL PARESTHESIA, RIGHT  . HOARSENESS  . CHEST PAIN  . ABDOMINAL PAIN  . RUQ PAIN  . BURN OF UNSPECIFIED DEGREE OF FOREARM  . UTI'S, HX OF  . Edema   Current Outpatient Prescriptions on File Prior to Visit  Medication Sig Dispense Refill  . Biotin 2500 MCG CAPS Take 1 capsule by mouth daily.        . calcium carbonate 200 MG capsule Take 250 mg by mouth daily.       . cholecalciferol (VITAMIN D) 1000 UNITS tablet Take 2,000 Units by mouth daily.       . diazepam (VALIUM) 5 MG tablet Take 1 tablet (5 mg total) by mouth every 12 (twelve) hours as needed for anxiety.  30 tablet  1  . hydrochlorothiazide (HYDRODIURIL) 25 MG tablet Take 1 tablet (25  mg total) by mouth daily.  90 tablet  3  . potassium chloride (KLOR-CON 10) 10 MEQ tablet Take only when taking a HCTZ pill  30 tablet  5  . RE CHLORDIAZEPOXIDE/CLIDINIUM 5-2.5 MG per capsule TAKE ONE CAPSULE BY MOUTH FOUR TIMES DAILY AS NEEDED FOR SPASMS IN STOMACH  60 capsule  0  . vitamin C (ASCORBIC ACID) 500 MG tablet Take 500 mg by mouth daily.        Marland Kitchen acetaminophen-codeine (TYLENOL #3) 300-30 MG per tablet Take 1/2 or one table bid for headache  30 tablet  1  . esomeprazole (NEXIUM) 40 MG capsule Take 40 mg by mouth. 1 cap 3 times weekly      . Eszopiclone (ESZOPICLONE) 3 MG TABS Take 1 tablet (3 mg total) by mouth at bedtime as needed. Take immediately before bedtime  10 tablet  1  . Hyoscyamine Sulfate 0.375 MG TBCR Take 1 tablet (0.375 mg total) by mouth daily.  60 each  5  . Hyoscyamine Sulfate 0.375 MG TBCR Take 1 tablet by mouth daily.      Marland Kitchen PARoxetine (PAXIL) 10 MG tablet Take 10 mg by mouth every morning.        . sulfamethoxazole-trimethoprim (BACTRIM DS) 800-160 MG per tablet Take 1 tab bid for  3 days after intercourse  14 tablet  1       Review of Systems See HPI    Objective:   Physical Exam Physical Exam  Nursing note and vitals reviewed.  Constitutional: She is oriented to person, place, and time. She appears well-developed and well-nourished.  HENT: TM's  Serous effusions bilaterally O/P   Mild erythema. NeckL  She does have tender ant cervical adenopathy bilaterally Head: Normocephalic and atraumatic.  Cardiovascular: Normal rate and regular rhythm. Exam reveals no gallop and no friction rub.  No murmur heard.  Pulmonary/Chest: Breath sounds normal. She has no wheezes. She has no rales.  Neurological: She is alert and oriented to person, place, and time.  Skin: Skin is warm and dry.  Psychiatric: She has a normal mood and affect. Her behavior is normal.              Assessment & Plan:  URI/ pharyngitis  Finish amoxicillin  Ant cervical adenopathy:  Should resolve expectantly as URI resolves.  I counseled pt to see me in 4 weeks to recheck.  She voices understanding

## 2012-02-11 NOTE — Telephone Encounter (Signed)
Patient still has not faxed over a form. Will wait for her to call back

## 2012-02-18 ENCOUNTER — Ambulatory Visit: Payer: Medicare Other | Admitting: Family Medicine

## 2012-02-20 ENCOUNTER — Ambulatory Visit: Payer: BC Managed Care – PPO

## 2012-02-20 ENCOUNTER — Ambulatory Visit (INDEPENDENT_AMBULATORY_CARE_PROVIDER_SITE_OTHER): Payer: BC Managed Care – PPO | Admitting: Family Medicine

## 2012-02-20 VITALS — BP 123/81 | HR 94 | Temp 98.7°F | Resp 16 | Ht 61.0 in | Wt 192.2 lb

## 2012-02-20 DIAGNOSIS — J9801 Acute bronchospasm: Secondary | ICD-10-CM

## 2012-02-20 DIAGNOSIS — J322 Chronic ethmoidal sinusitis: Secondary | ICD-10-CM

## 2012-02-20 DIAGNOSIS — R05 Cough: Secondary | ICD-10-CM

## 2012-02-20 DIAGNOSIS — R059 Cough, unspecified: Secondary | ICD-10-CM

## 2012-02-20 DIAGNOSIS — R0602 Shortness of breath: Secondary | ICD-10-CM

## 2012-02-20 LAB — POCT CBC
HCT, POC: 44.1 % (ref 37.7–47.9)
Hemoglobin: 14.1 g/dL (ref 12.2–16.2)
Lymph, poc: 1.7 (ref 0.6–3.4)
MCH, POC: 27.4 pg (ref 27–31.2)
MCHC: 32 g/dL (ref 31.8–35.4)
POC Granulocyte: 4.5 (ref 2–6.9)
POC LYMPH PERCENT: 24.7 %L (ref 10–50)
RDW, POC: 14.1 %
WBC: 6.9 10*3/uL (ref 4.6–10.2)

## 2012-02-20 MED ORDER — LEVALBUTEROL HCL 0.63 MG/3ML IN NEBU: 0.6300 mg | INHALATION_SOLUTION | Freq: Once | RESPIRATORY_TRACT | Status: DC

## 2012-02-20 MED ORDER — PREDNISONE 20 MG PO TABS: ORAL_TABLET | ORAL | Status: DC

## 2012-02-20 MED ORDER — AZITHROMYCIN 250 MG PO TABS: ORAL_TABLET | ORAL | Status: DC

## 2012-02-20 NOTE — Progress Notes (Signed)
[address]   [place], [place]  [address]   [phone]  Subjective:    Patient ID: Rhonda Henry, female    DOB: [birth year], 67 y.o.   MRN: [id number]  HPI This 67 y.o. female presents for evaluation of cold symptoms.  Onset of URI on 02/06/12.  On 02/07/12 diagnosed with URI: prescribed Augmentin; took for five days and stopped due to stomach upset. Started to feel better five days into Augmentin; suffering with diarrhea.  Got new pillow; stunk like formaldehyde.  Started using for two days and started coughing like crazy; this occurred on 02/15/12.  Has been coughing since then and worsening.  Hard to take deep breath; gets pain L sided.  No fever but +chills; no sweats.  Yesterday was horrible with chills.  Nose now runny again.  Coughing dry; scant mucous light blood.  No headache.  No ear pain but +sore throat from coughing.  Rhinorrhea clear.  No sinus pressure but B glands swollen.  +SOB.  +wheezing; no history of asthma.  Allergic to pillow.  No tobacco.  Dayquil cough; cortesedin cough, vicks vapor rub.     Review of Systems  Constitutional: Positive for chills and fatigue. Negative for fever and diaphoresis.  HENT: Positive for congestion, sore throat, rhinorrhea, voice change and postnasal drip. Negative for ear pain and trouble swallowing.   Respiratory: Positive for cough, shortness of breath and wheezing. Negative for stridor.   Gastrointestinal: Negative for nausea, vomiting and diarrhea.  Skin: Negative for rash.    Past Medical History  Diagnosis Date  . Nontoxic multinodular goiter   . Other voice and resonance disorders   . Acute sinusitis, unspecified   . Esophageal reflux   . Goiter, unspecified   . Cramp of limb   . Lumbago   . Burn of unspecified degree of forearm   . Persistent disorder of initiating or maintaining sleep   . Personal history of unspecified urinary disorder   . Other chronic pain   . Unspecified essential hypertension   . Depressive disorder,  not elsewhere classified   . Anxiety state, unspecified   . Antritis (stomach)   . Arthritis   . Menopause   . Paresthesia     R facial, s/p facelift    Past Surgical History  Procedure Laterality Date  . Facial cosmetic surgery  7-10    compl by thick scars and a scar necrosis on R cheek  . Breast biopsies    . Breast lumpectomy      x 2  . Refractive surgery  1997    Prior to Admission medications   Medication Sig Start Date End Date Taking? Authorizing Provider  calcium carbonate 200 MG capsule Take 250 mg by mouth daily.    Yes Historical Provider, MD  cholecalciferol (VITAMIN D) 1000 UNITS tablet Take 2,000 Units by mouth daily.    Yes Historical Provider, MD  diazepam (VALIUM) 5 MG tablet Take 1 tablet (5 mg total) by mouth every 12 (twelve) hours as needed for anxiety. 01/11/12  Yes Kendrick Ranch, MD  Hyoscyamine Sulfate 0.375 MG TBCR Take 1 tablet (0.375 mg total) by mouth daily. 01/17/12  Yes Kendrick Ranch, MD  Hyoscyamine Sulfate 0.375 MG TBCR Take 1 tablet by mouth daily.   Yes Historical Provider, MD  potassium chloride (KLOR-CON 10) 10 MEQ tablet Take only when taking a HCTZ pill 01/03/12  Yes Kendrick Ranch, MD  RE CHLORDIAZEPOXIDE/CLIDINIUM 5-2.5 MG per capsule TAKE ONE  CAPSULE BY MOUTH FOUR TIMES DAILY AS NEEDED FOR SPASMS IN STOMACH 05/27/10  Yes Jacques Navy, MD  sulfamethoxazole-trimethoprim (BACTRIM DS) 800-160 MG per tablet Take 1 tab bid for 3 days after intercourse 01/11/12  Yes Kendrick Ranch, MD  vitamin C (ASCORBIC ACID) 500 MG tablet Take 500 mg by mouth daily.     Yes Historical Provider, MD  acetaminophen-codeine (TYLENOL #3) 300-30 MG per tablet Take 1/2 or one table bid for headache 05/20/11   Kendrick Ranch, MD  amoxicillin (AMOXIL) 875 MG tablet Take 875 mg by mouth 2 (two) times daily.    Historical Provider, MD  Biotin 2500 MCG CAPS Take 1 capsule by mouth daily.      Historical Provider, MD  esomeprazole (NEXIUM) 40 MG  capsule Take 40 mg by mouth. 1 cap 3 times weekly    Historical Provider, MD  Eszopiclone (ESZOPICLONE) 3 MG TABS Take 1 tablet (3 mg total) by mouth at bedtime as needed. Take immediately before bedtime 07/30/10 07/30/11  Kendrick Ranch, MD  hydrochlorothiazide (HYDRODIURIL) 25 MG tablet Take 1 tablet (25 mg total) by mouth daily. 02/09/12 02/08/13  Kendrick Ranch, MD  PARoxetine (PAXIL) 10 MG tablet Take 10 mg by mouth every morning.      Historical Provider, MD    No Known Allergies  History   Social History  . Marital Status: Married    Spouse Name: N/A    Number of Children: 2  . Years of Education: N/A   Occupational History  . Engineer, agricultural    Social History Main Topics  . Smoking status: Never Smoker   . Smokeless tobacco: Never Used  . Alcohol Use: Yes     Comment: socially  . Drug Use: No  . Sexually Active: Yes    Birth Control/ Protection: Other-see comments   Other Topics Concern  . Not on file   Social History Narrative  . No narrative on file    Family History  Problem Relation Age of Onset  . Hypertension Other   . Thyroid cancer Mother   . Heart disease Mother   . Macular degeneration Mother   . Glaucoma Father   . Colon cancer Neg Hx         Objective:   Physical Exam  Nursing note and vitals reviewed. Constitutional: She is oriented to person, place, and time. She appears well-developed and well-nourished. No distress.  HENT:  Head: Normocephalic and atraumatic.  Right Ear: External ear normal.  Left Ear: External ear normal.  Nose: Nose normal.  Mouth/Throat: Oropharynx is clear and moist.  Eyes: Conjunctivae and EOM are normal. Pupils are equal, round, and reactive to light.  Neck: Normal range of motion. Neck supple.  Cardiovascular: Normal rate, regular rhythm and normal heart sounds.   No murmur heard. Pulmonary/Chest: Effort normal and breath sounds normal. She has no decreased breath sounds. She has no wheezes. She has  no rales.  Lymphadenopathy:    She has cervical adenopathy.  Neurological: She is alert and oriented to person, place, and time.  Skin: Skin is warm and dry. No rash noted. She is not diaphoretic.  Psychiatric: She has a normal mood and affect. Her behavior is normal.    XOPENEX NEBULIZER 0.63 ADMINISTERED IN OFFICE.    Results for orders placed in visit on 02/20/12  POCT CBC      Result Value Range   WBC 6.9  4.6 - 10.2 K/uL   Lymph, poc 1.7  0.6 - 3.4   POC LYMPH PERCENT 24.7  10 - 50 %L   MID (cbc) 0.7  0 - 0.9   POC MID % 9.7  0 - 12 %M   POC Granulocyte 4.5  2 - 6.9   Granulocyte percent 65.6  37 - 80 %G   RBC 5.14  4.04 - 5.48 M/uL   Hemoglobin 14.1  12.2 - 16.2 g/dL   HCT, POC 21.3  08.6 - 47.9 %   MCV 85.8  80 - 97 fL   MCH, POC 27.4  27 - 31.2 pg   MCHC 32.0  31.8 - 35.4 g/dL   RDW, POC 57.8     Platelet Count, POC 275  142 - 424 K/uL   MPV 8.6  0 - 99.8 fL  GLUCOSE, POCT (MANUAL RESULT ENTRY)      Result Value Range   POC Glucose 99  70 - 99 mg/dl   [facility] reading (PRIMARY) by  Dr. Katrinka Blazing. CXR:  NAD; +RLL nodule 5mm x 8 mm.   Assessment & Plan:  Cough - Plan: DG Chest 2 View, POCT CBC  Shortness of breath  Bronchospasm, acute   1. Bronchospasm:  New.  S/p Xopenex nebulizer in office; rx for Prednisone provided.  Recommend Claritin or Zyrtec 10mg  daily for any allergic component to bronchospasm; also recommend Benadryl qhs. 2.  Shortness of Breath: New.  Secondary to bronchospasm.  CXR negative for infiltrate.  S/p Xopenex neb in office. 3.  Cough/URI:  New.  S/p Augmentin for five days; rx for Zpack provided.  Meds ordered this encounter  Medications  . levalbuterol (XOPENEX) nebulizer solution 0.63 mg    Sig:   . predniSONE (DELTASONE) 20 MG tablet    Sig: Two tablets daily x 5 days    Dispense:  10 tablet    Refill:  0  . azithromycin (ZITHROMAX) 250 MG tablet    Sig: Take 2 tabs PO x 1 dose, then 1 tab PO QD x 4 days    Dispense:  6 tablet     Refill:  0

## 2012-02-20 NOTE — Patient Instructions (Addendum)
Cough - Plan: DG Chest 2 View, POCT CBC, POCT glucose (manual entry)  Shortness of breath - Plan: POCT glucose (manual entry), levalbuterol (XOPENEX) nebulizer solution 0.63 mg  Bronchospasm, acute - Plan: levalbuterol (XOPENEX) nebulizer solution 0.63 mg  Ethmoid sinusitis    RECOMMEND STARTING ZYRTEC OR CLARITIN 10MG  ONE DAILY FOR NEXT WEEK. CAN TAKE BENADRYL AT BEDTIME.

## 2012-03-01 ENCOUNTER — Encounter: Payer: BC Managed Care – PPO | Admitting: Family Medicine

## 2012-03-01 DIAGNOSIS — F418 Other specified anxiety disorders: Secondary | ICD-10-CM | POA: Insufficient documentation

## 2012-03-01 DIAGNOSIS — E042 Nontoxic multinodular goiter: Secondary | ICD-10-CM | POA: Insufficient documentation

## 2012-03-01 NOTE — Progress Notes (Signed)
cancelled    This encounter was created in error - please disregard.

## 2012-03-09 ENCOUNTER — Ambulatory Visit: Payer: BC Managed Care – PPO | Admitting: Internal Medicine

## 2012-03-13 ENCOUNTER — Other Ambulatory Visit: Payer: Self-pay | Admitting: Physical Medicine and Rehabilitation

## 2012-03-13 DIAGNOSIS — M545 Low back pain: Secondary | ICD-10-CM

## 2012-03-13 DIAGNOSIS — M48062 Spinal stenosis, lumbar region with neurogenic claudication: Secondary | ICD-10-CM

## 2012-03-13 DIAGNOSIS — G894 Chronic pain syndrome: Secondary | ICD-10-CM

## 2012-03-13 DIAGNOSIS — IMO0002 Reserved for concepts with insufficient information to code with codable children: Secondary | ICD-10-CM

## 2012-03-14 ENCOUNTER — Other Ambulatory Visit: Payer: Self-pay | Admitting: Physical Medicine and Rehabilitation

## 2012-03-14 DIAGNOSIS — IMO0002 Reserved for concepts with insufficient information to code with codable children: Secondary | ICD-10-CM

## 2012-03-14 DIAGNOSIS — M48062 Spinal stenosis, lumbar region with neurogenic claudication: Secondary | ICD-10-CM

## 2012-03-14 DIAGNOSIS — M545 Low back pain: Secondary | ICD-10-CM

## 2012-03-14 DIAGNOSIS — G894 Chronic pain syndrome: Secondary | ICD-10-CM

## 2012-03-17 ENCOUNTER — Ambulatory Visit
Admission: RE | Admit: 2012-03-17 | Discharge: 2012-03-17 | Disposition: A | Discharge status: Home / Self Care | Payer: BC Managed Care – PPO | Source: Ambulatory Visit | Attending: Physical Medicine and Rehabilitation | Admitting: Physical Medicine and Rehabilitation

## 2012-03-17 ENCOUNTER — Other Ambulatory Visit: Payer: Self-pay | Admitting: Physical Medicine and Rehabilitation

## 2012-03-17 DIAGNOSIS — M545 Low back pain: Secondary | ICD-10-CM

## 2012-03-17 DIAGNOSIS — G894 Chronic pain syndrome: Secondary | ICD-10-CM

## 2012-03-17 DIAGNOSIS — M48062 Spinal stenosis, lumbar region with neurogenic claudication: Secondary | ICD-10-CM

## 2012-03-17 DIAGNOSIS — IMO0002 Reserved for concepts with insufficient information to code with codable children: Secondary | ICD-10-CM

## 2012-03-27 ENCOUNTER — Telehealth: Payer: Self-pay | Admitting: Internal Medicine

## 2012-03-27 MED ORDER — AZITHROMYCIN 250 MG PO TABS: ORAL_TABLET | ORAL | Status: DC

## 2012-03-27 NOTE — Telephone Encounter (Signed)
[facility] and [facility]VM message for pt

## 2012-03-27 NOTE — Telephone Encounter (Signed)
Pt called 03.24.14 at 925 am.. She states she is going to an extended trip to [place] and would like if the doctor will [facility] [address].Marland Kitchen Pt is requesting a call back... [phone]

## 2012-03-27 NOTE — Telephone Encounter (Signed)
See Ardenia's note 

## 2012-03-27 NOTE — Telephone Encounter (Signed)
Ok to give Z-pack

## 2012-05-02 ENCOUNTER — Encounter: Payer: Self-pay | Admitting: Internal Medicine

## 2012-05-02 ENCOUNTER — Ambulatory Visit (HOSPITAL_BASED_OUTPATIENT_CLINIC_OR_DEPARTMENT_OTHER)
Admission: RE | Admit: 2012-05-02 | Discharge: 2012-05-02 | Disposition: A | Discharge status: Home / Self Care | Payer: Medicare Other | Source: Ambulatory Visit | Attending: Internal Medicine | Admitting: Internal Medicine

## 2012-05-02 ENCOUNTER — Ambulatory Visit (INDEPENDENT_AMBULATORY_CARE_PROVIDER_SITE_OTHER): Payer: BC Managed Care – PPO | Admitting: Internal Medicine

## 2012-05-02 VITALS — BP 144/88 | HR 79 | Temp 97.7°F | Resp 18 | Wt 193.0 lb

## 2012-05-02 DIAGNOSIS — M545 Low back pain, unspecified: Secondary | ICD-10-CM

## 2012-05-02 DIAGNOSIS — M25552 Pain in left hip: Secondary | ICD-10-CM

## 2012-05-02 DIAGNOSIS — M5126 Other intervertebral disc displacement, lumbar region: Secondary | ICD-10-CM

## 2012-05-02 DIAGNOSIS — M76899 Other specified enthesopathies of unspecified lower limb, excluding foot: Secondary | ICD-10-CM | POA: Insufficient documentation

## 2012-05-02 DIAGNOSIS — M25559 Pain in unspecified hip: Secondary | ICD-10-CM

## 2012-05-02 MED ORDER — TRAMADOL HCL 50 MG PO TABS: 50.0000 mg | ORAL_TABLET | Freq: Four times a day (QID) | ORAL | Status: DC | PRN

## 2012-05-02 MED ORDER — PREDNISONE 20 MG PO TABS: ORAL_TABLET | ORAL | Status: DC

## 2012-05-02 NOTE — Progress Notes (Signed)
Subjective:    Patient ID: Rhonda Henry, female    DOB: 03/28/1945, 67 y.o.   MRN: [id number]  HPI Rhonda Henry is here for acute visit.    She has longstanding intermittant lumbar discomfort and had been seeing Dr. Nickola Major who was treating with steroid injections.  Dr. Eduard Clos is no longer covered under pt s insurance.   Pt is having a flare of her pain and it is now radiating to both gluteal areas and insider her L thigh.  No numbness no motor weakness of legs .   She is in quite a bit of pain.  Oral steroids have helped somewhat in the past  She is concerned over pain in both hips  Review of most recent MRI shows L4-L5 partial involution of disc protrusion.  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    No Known Allergies Past Medical History  Diagnosis Date  . Nontoxic multinodular goiter   . Other voice and resonance disorders   . Acute sinusitis, unspecified   . Esophageal reflux   . Goiter, unspecified   . Cramp of limb   . Lumbago   . Burn of unspecified degree of forearm   . Persistent disorder of initiating or maintaining sleep   . Personal history of unspecified urinary disorder   . Other chronic pain   . Unspecified essential hypertension   . Depressive disorder, not elsewhere classified   . Anxiety state, unspecified   . Antritis (stomach)   . Arthritis   . Menopause   . Paresthesia     R facial, s/p facelift   Past Surgical History  Procedure Laterality Date  . Facial cosmetic surgery  7-10    compl by thick scars and a scar necrosis on R cheek  . Breast biopsies    . Breast lumpectomy      x 2  . Refractive surgery  1997  . Lumbar epidural injection  Jan 15    History   Social History  . Marital Status: Married    Spouse Name: N/A    Number of Children: 2  . Years of Education: N/A   Occupational History  . Engineer, agricultural    Social History Main Topics  . Smoking status: Never Smoker   . Smokeless tobacco: Never Used  . Alcohol Use: Yes     Comment: socially  . Drug Use: No  . Sexually Active: Yes    Birth Control/ Protection: Other-see comments   Other Topics Concern  . Not on file   Social History Narrative  . No narrative on file   Family History  Problem Relation Age of Onset  . Hypertension Other   . Thyroid cancer Mother   . Heart disease Mother   . Macular degeneration Mother   . Glaucoma Father   . Colon cancer Neg Hx    Patient Active Problem List   Diagnosis Date Noted  . 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 Edema 07/21/2010  . FACIAL PARESTHESIA, RIGHT 07/02/2009  . VITAMIN D DEFICIENCY 05/26/2009  . CHEST PAIN 06/06/2008  . ABDOMINAL PAIN 12/12/2007  . RUQ PAIN 12/12/2007  . GERD 06/09/2007  . LOW BACK PAIN 06/09/2007  . CRAMPS,LEG 06/09/2007  . BURN OF UNSPECIFIED DEGREE OF FOREARM 04/15/2007  . HYPERTENSION 07/30/2006  . UTI'S, HX OF 07/30/2006   Current Outpatient Prescriptions on File Prior to Visit  Medication Sig Dispense Refill  . diazepam (VALIUM) 5 MG tablet  Take 1 tablet (5 mg total) by mouth every 12 (twelve) hours as needed for anxiety.  30 tablet  1  . Hyoscyamine Sulfate 0.375 MG TBCR Take 1 tablet (0.375 mg total) by mouth daily.  60 each  5   Current Facility-Administered Medications on File Prior to Visit  Medication Dose Route Frequency Provider Last Rate Last Dose  . levalbuterol (XOPENEX) nebulizer solution 0.63 mg  0.63 mg Nebulization Once Ethelda Chick, MD           Review of Systems See HPI    Objective:   Physical Exam Physical Exam  Nursing note and vitals reviewed.  Constitutional: She is oriented to person, place, and time. She appears well-developed and well-nourished.  HENT:  Head: Normocephalic and atraumatic.  Cardiovascular: Normal rate and regular rhythm. Exam reveals no gallop and no friction rub.  No murmur heard.  Pulmonary/Chest: Breath sounds normal. She has no wheezes. She  has no rales.  Neurological: She is alert and oriented to person, place, and time.  Neurologic of LE SLR pos on Left Refelexes 2+symmetric Motor 5/5 of LE Skin: Skin is warm and dry.  Psychiatric: She has a normal mood and affect. Her behavior is normal.         Assessment & Plan:  HIstory of herniated disc  /  Low back pain    Will give 6 day prednisone taper 60 mg and taper q2days.  Ultram 50 mg q8h prn   Will refer to Dr. Ollen Bowl and Sander Radon   Bilateral hip pain  Will get plain films today  Addendum :  I spoke with [facility]  Dr. Clovis Pu assistant .  She will call pt to set up appt for evaluation and possible injeciton

## 2012-05-04 ENCOUNTER — Telehealth: Payer: Self-pay | Admitting: *Deleted

## 2012-05-04 NOTE — Telephone Encounter (Signed)
Message copied by Mathews Robinsons on Thu May 04, 2012  2:57 PM ------      Message from: [name]      Created: Wed May 03, 2012 12:52 PM       [name]            Call pt and let her know that her hip xray shows minimal bone spurs and being very minimal I do not think this is the cause of any pain in her hips            Let her know that I spoke with [name],  She is the assitant to the physiatrist in [name] office.  [name] should be [name] with an appt.  If any problem let us know ------

## 2012-05-04 NOTE — Telephone Encounter (Signed)
Message copied by Mathews Robinsons on Thu May 04, 2012 11:48 AM ------      Message from: [name]      Created: Wed May 03, 2012 12:52 PM       [name]            Call pt and let her know that her hip xray shows minimal bone spurs and being very minimal I do not think this is the cause of any pain in her hips            Let her know that I spoke with [name],  She is the assitant to the physiatrist in [name] office.  [name] should be [name] with an appt.  If any problem let us know ------

## 2012-05-04 NOTE — Telephone Encounter (Signed)
LVM message regarding x ray results and appt info

## 2012-05-11 ENCOUNTER — Telehealth: Payer: Self-pay | Admitting: Internal Medicine

## 2012-05-11 NOTE — Telephone Encounter (Signed)
Attempted to call pt LVM  

## 2012-05-11 NOTE — Telephone Encounter (Signed)
Pt called about referral to [facility].  Stated that appointment has not been made with them.  They have not returned her phone calls.  Would like a new referral.

## 2012-05-12 ENCOUNTER — Telehealth: Payer: Self-pay | Admitting: *Deleted

## 2012-05-12 NOTE — Telephone Encounter (Signed)
LVM message to return call  

## 2012-05-16 ENCOUNTER — Telehealth: Payer: Self-pay | Admitting: *Deleted

## 2012-05-17 NOTE — Telephone Encounter (Signed)
Pt still has not been contacted by [facility] pt states that she is feeling better and wishes not to see anyone from that office, states that is she has a reoccurrence she prefers to go to [place] for care

## 2012-05-31 ENCOUNTER — Encounter: Payer: BC Managed Care – PPO | Admitting: Internal Medicine

## 2012-06-02 ENCOUNTER — Encounter: Payer: Self-pay | Admitting: *Deleted

## 2012-07-05 ENCOUNTER — Telehealth: Payer: Self-pay | Admitting: Neurology

## 2012-07-06 ENCOUNTER — Other Ambulatory Visit: Payer: Self-pay | Admitting: *Deleted

## 2012-07-06 ENCOUNTER — Telehealth: Payer: Self-pay | Admitting: *Deleted

## 2012-07-06 DIAGNOSIS — R209 Unspecified disturbances of skin sensation: Secondary | ICD-10-CM

## 2012-07-06 DIAGNOSIS — M545 Low back pain: Secondary | ICD-10-CM

## 2012-07-06 MED ORDER — DIAZEPAM 5 MG PO TABS: 5.0000 mg | ORAL_TABLET | Freq: Two times a day (BID) | ORAL | Status: DC | PRN

## 2012-07-06 NOTE — Telephone Encounter (Signed)
referral

## 2012-07-06 NOTE — Telephone Encounter (Signed)
Notified pt that a referral to Dr Dr Maury Dus will be completed after the [facility]

## 2012-07-06 NOTE — Telephone Encounter (Signed)
Rhonda Henry would like a referral to [name] [name]

## 2012-07-06 NOTE — Telephone Encounter (Signed)
Refill request still has some but running low

## 2012-07-06 NOTE — Telephone Encounter (Signed)
Patient c/o of intermittent pain in the top L side of head. She thinks it is affecting her cognitive ability. Requesting earlier appt. Explained unfortunately no earlier appointments available at the moment. Advised patient of ER for urgent matters. Will place on wait list. Patient agreed.

## 2012-07-06 NOTE — Telephone Encounter (Signed)
Ok to refer   Call pt and ask Rhonda Henry if she wants referral for her back discomfort

## 2012-07-10 NOTE — Telephone Encounter (Signed)
[facility]

## 2012-08-09 ENCOUNTER — Ambulatory Visit (HOSPITAL_BASED_OUTPATIENT_CLINIC_OR_DEPARTMENT_OTHER): Admission: RE | Admit: 2012-08-09 | Payer: Medicare Other | Source: Ambulatory Visit

## 2012-08-09 ENCOUNTER — Ambulatory Visit (HOSPITAL_COMMUNITY)
Admission: RE | Admit: 2012-08-09 | Discharge: 2012-08-09 | Disposition: A | Discharge status: Home / Self Care | Payer: Medicare Other | Source: Ambulatory Visit | Attending: Internal Medicine | Admitting: Internal Medicine

## 2012-08-09 ENCOUNTER — Ambulatory Visit (INDEPENDENT_AMBULATORY_CARE_PROVIDER_SITE_OTHER): Payer: Medicare Other | Admitting: Internal Medicine

## 2012-08-09 ENCOUNTER — Ambulatory Visit (HOSPITAL_BASED_OUTPATIENT_CLINIC_OR_DEPARTMENT_OTHER)
Admission: RE | Admit: 2012-08-09 | Discharge: 2012-08-09 | Disposition: A | Discharge status: Home / Self Care | Payer: Medicare Other | Source: Ambulatory Visit | Attending: Internal Medicine | Admitting: Internal Medicine

## 2012-08-09 ENCOUNTER — Other Ambulatory Visit: Payer: Self-pay | Admitting: Internal Medicine

## 2012-08-09 ENCOUNTER — Encounter: Payer: Self-pay | Admitting: Internal Medicine

## 2012-08-09 VITALS — BP 132/86 | HR 75 | Resp 16 | Ht 61.0 in | Wt 193.0 lb

## 2012-08-09 DIAGNOSIS — R319 Hematuria, unspecified: Secondary | ICD-10-CM | POA: Insufficient documentation

## 2012-08-09 DIAGNOSIS — Z0000 Encounter for general adult medical examination without abnormal findings: Secondary | ICD-10-CM

## 2012-08-09 DIAGNOSIS — R413 Other amnesia: Secondary | ICD-10-CM

## 2012-08-09 DIAGNOSIS — Z23 Encounter for immunization: Secondary | ICD-10-CM

## 2012-08-09 DIAGNOSIS — R51 Headache: Secondary | ICD-10-CM | POA: Insufficient documentation

## 2012-08-09 DIAGNOSIS — E049 Nontoxic goiter, unspecified: Secondary | ICD-10-CM | POA: Insufficient documentation

## 2012-08-09 DIAGNOSIS — E8849 Other mitochondrial metabolism disorders: Secondary | ICD-10-CM

## 2012-08-09 DIAGNOSIS — K649 Unspecified hemorrhoids: Secondary | ICD-10-CM

## 2012-08-09 DIAGNOSIS — E8840 Mitochondrial metabolism disorder, unspecified: Secondary | ICD-10-CM

## 2012-08-09 DIAGNOSIS — I10 Essential (primary) hypertension: Secondary | ICD-10-CM

## 2012-08-09 DIAGNOSIS — K219 Gastro-esophageal reflux disease without esophagitis: Secondary | ICD-10-CM

## 2012-08-09 DIAGNOSIS — D1803 Hemangioma of intra-abdominal structures: Secondary | ICD-10-CM

## 2012-08-09 DIAGNOSIS — R55 Syncope and collapse: Secondary | ICD-10-CM | POA: Insufficient documentation

## 2012-08-09 DIAGNOSIS — I6529 Occlusion and stenosis of unspecified carotid artery: Secondary | ICD-10-CM | POA: Insufficient documentation

## 2012-08-09 DIAGNOSIS — E559 Vitamin D deficiency, unspecified: Secondary | ICD-10-CM

## 2012-08-09 DIAGNOSIS — Z139 Encounter for screening, unspecified: Secondary | ICD-10-CM

## 2012-08-09 DIAGNOSIS — R519 Headache, unspecified: Secondary | ICD-10-CM | POA: Insufficient documentation

## 2012-08-09 DIAGNOSIS — R42 Dizziness and giddiness: Secondary | ICD-10-CM | POA: Insufficient documentation

## 2012-08-09 LAB — CBC WITH DIFFERENTIAL/PLATELET
Basophils Absolute: 0 10*3/uL (ref 0.0–0.1)
Eosinophils Absolute: 0.2 10*3/uL (ref 0.0–0.7)
Eosinophils Relative: 3 % (ref 0–5)
Lymphocytes Relative: 34 % (ref 12–46)
MCV: 80.7 fL (ref 78.0–100.0)
Platelets: 298 10*3/uL (ref 150–400)
RDW: 13.5 % (ref 11.5–15.5)
WBC: 7 10*3/uL (ref 4.0–10.5)

## 2012-08-09 LAB — COMPREHENSIVE METABOLIC PANEL
ALT: 13 U/L (ref 0–35)
AST: 18 U/L (ref 0–37)
Alkaline Phosphatase: 70 U/L (ref 39–117)
Chloride: 104 mEq/L (ref 96–112)
Creat: 0.84 mg/dL (ref 0.50–1.10)
Total Bilirubin: 0.7 mg/dL (ref 0.3–1.2)

## 2012-08-09 LAB — POCT URINALYSIS DIPSTICK
Nitrite, UA: NEGATIVE
Urobilinogen, UA: 0.2
pH, UA: 6.5

## 2012-08-09 LAB — TSH: TSH: 2.195 u[IU]/mL (ref 0.350–4.500)

## 2012-08-09 LAB — LIPID PANEL
LDL Cholesterol: 130 mg/dL — ABNORMAL HIGH (ref 0–99)
Total CHOL/HDL Ratio: 4.3 Ratio
VLDL: 20 mg/dL (ref 0–40)

## 2012-08-09 LAB — URINALYSIS, ROUTINE W REFLEX MICROSCOPIC
Bilirubin Urine: NEGATIVE
Glucose, UA: NEGATIVE mg/dL
Ketones, ur: NEGATIVE mg/dL
Protein, ur: NEGATIVE mg/dL
Urobilinogen, UA: 0.2 mg/dL (ref 0.0–1.0)

## 2012-08-09 MED ORDER — NYSTATIN-TRIAMCINOLONE 100000-0.1 UNIT/GM-% EX OINT: TOPICAL_OINTMENT | CUTANEOUS | Status: DC
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
## 2012-08-09 NOTE — Patient Instructions (Addendum)
Use  Preparation H with hydrocortisone suppositories daily after BM  Use Mycolog creme externally bid for 10 days only  Will schedule  Carotid and thyroid ultrasound

## 2012-08-10 ENCOUNTER — Telehealth: Payer: Self-pay | Admitting: Internal Medicine

## 2012-08-10 DIAGNOSIS — R319 Hematuria, unspecified: Secondary | ICD-10-CM

## 2012-08-10 LAB — URINALYSIS, MICROSCOPIC ONLY
Bacteria, UA: NONE SEEN
Crystals: NONE SEEN

## 2012-08-10 LAB — VITAMIN D 25 HYDROXY (VIT D DEFICIENCY, FRACTURES): Vit D, 25-Hydroxy: 27 ng/mL — ABNORMAL LOW (ref 30–89)

## 2012-08-10 NOTE — Telephone Encounter (Signed)
Spoke to pt and informed of xray results  Will refer to urology for hematuria

## 2012-08-31 ENCOUNTER — Encounter: Payer: BC Managed Care – PPO | Admitting: Internal Medicine

## 2012-09-21 ENCOUNTER — Other Ambulatory Visit: Payer: Self-pay | Admitting: Orthopedic Surgery

## 2012-09-21 DIAGNOSIS — M79672 Pain in left foot: Secondary | ICD-10-CM

## 2012-10-03 ENCOUNTER — Ambulatory Visit: Payer: BC Managed Care – PPO | Admitting: Neurology

## 2012-10-09 ENCOUNTER — Other Ambulatory Visit: Payer: Self-pay

## 2012-10-09 DIAGNOSIS — Z1231 Encounter for screening mammogram for malignant neoplasm of breast: Secondary | ICD-10-CM

## 2012-11-01 ENCOUNTER — Ambulatory Visit: Payer: Medicare Other

## 2012-11-01 ENCOUNTER — Ambulatory Visit: Payer: Medicare Other | Admitting: Internal Medicine

## 2012-11-08 ENCOUNTER — Encounter: Payer: Self-pay | Admitting: Podiatry

## 2012-11-08 ENCOUNTER — Ambulatory Visit (INDEPENDENT_AMBULATORY_CARE_PROVIDER_SITE_OTHER): Payer: BC Managed Care – PPO | Admitting: Podiatry

## 2012-11-08 ENCOUNTER — Telehealth: Payer: Self-pay | Admitting: *Deleted

## 2012-11-08 ENCOUNTER — Ambulatory Visit (INDEPENDENT_AMBULATORY_CARE_PROVIDER_SITE_OTHER): Payer: BC Managed Care – PPO

## 2012-11-08 VITALS — BP 146/74 | HR 70 | Resp 12 | Ht 61.0 in | Wt 190.0 lb

## 2012-11-08 DIAGNOSIS — S92302G Fracture of unspecified metatarsal bone(s), left foot, subsequent encounter for fracture with delayed healing: Secondary | ICD-10-CM

## 2012-11-08 DIAGNOSIS — R52 Pain, unspecified: Secondary | ICD-10-CM

## 2012-11-08 DIAGNOSIS — IMO0001 Reserved for inherently not codable concepts without codable children: Secondary | ICD-10-CM

## 2012-11-08 DIAGNOSIS — S92309A Fracture of unspecified metatarsal bone(s), unspecified foot, initial encounter for closed fracture: Secondary | ICD-10-CM

## 2012-11-08 NOTE — Patient Instructions (Signed)
Wear the boot or surgical shoe until pain subsides in the left foot. Will contact representative for bone stimulator. Use acetaminophen as needed for pain control.

## 2012-11-08 NOTE — Telephone Encounter (Signed)
Dr Leeanne Deed ordered [employer] Bone Growth Stimulator for pt's left 5th metatarsal fracture - date of injury 06/12/2012.  I contacted Gillie Manners, he will be by today to pick up pt notes, pt data, and insurance.

## 2012-11-08 NOTE — Progress Notes (Addendum)
  Subjective:    Patient ID: Rhonda Henry, female    DOB: [birth year], 67 y.o.   MRN: [id number]  Foot Pain This is a new problem. The current episode started more than 1 month ago. The problem occurs constantly. The problem has been unchanged. Associated symptoms include fatigue, joint swelling and numbness. The symptoms are aggravated by walking and standing. Treatments tried: WOODEN SHOES. The treatment provided no relief.    Patient describes accidental injury occurring on 06/13/2002.  She was initially diagnosed by orthopedic doctor in around that time as a sprain in the ankle and patient was given an ankle brace to wear. She  Wore  the brace for 17 days but the pain persisted.She returned to orthopedic doctor in around June July and at that time and  A cam boot was dispensed for her to wear for approximately 2 weeks. Pain persisted and she was referred for physical therapy for 2 months without improvement of symptoms. She then visited a second orthopedic,Dr Lajoyce Corners who x-rayed her foot and demonstrated a fracture in the fifth left metatarsal area. She continue to wear a surgical shoe the symptoms persisted. She had an MRI done that on 09/22/2012 at [facility] which demonstrated transverse fracture involving the base of the fifth metatarsal, left foot  She still continues to have pain around the base of fifth metatarsal aggravated with standing and walking.  Review of Systems  Constitutional: Positive for fatigue and unexpected weight change.  Musculoskeletal: Positive for gait problem and joint swelling.  Neurological: Positive for numbness.       Objective:   Physical Exam  Orientated x86 67 year old white female presents with her husband.  Vascular: The DP and PT pulses are two over four bilaterally. Feet are warm to touch bilaterally.  Dermatological: Within normal limits.  Musculoskeletal: Palpable tenderness at base fifth left metatarsal. No crepitus present. This  duplicates her area of discomfort.  X-ray examination: See attached report demonstrates some residual fracture at the base of fifth left metatarsal possibly involving the joint line.      Assessment & Plan:   Assessment: NON-union base of fifth left metatarsal.  Plan: Patient advised to return to wearing her existing boot until pain resolved and transition into surgical shoe at that time. Will begin process to obtain a bone stimulator. We'll contact the representative from [employer]. Office will contact patient when bone stimulator is available.

## 2012-11-16 ENCOUNTER — Other Ambulatory Visit: Payer: Self-pay | Admitting: *Deleted

## 2012-11-16 DIAGNOSIS — S92909K Unspecified fracture of unspecified foot, subsequent encounter for fracture with nonunion: Secondary | ICD-10-CM

## 2012-11-16 MED ORDER — DIAZEPAM 5 MG PO TABS: 5.0000 mg | ORAL_TABLET | Freq: Two times a day (BID) | ORAL | Status: DC | PRN

## 2012-11-16 NOTE — Telephone Encounter (Signed)
Refill request. Pt was sent to orthopedics in June and dx with a sprain and then found out it was a fx pt is still having problems and states that it is still not healed Dr Leeanne Deed  Has ordered a bone stimulator. Pt would like a referral to another ortho who specializes in the foot.

## 2012-11-16 NOTE — Telephone Encounter (Signed)
Bobbie  I do like Dr. Victorino Dike who specializes in foot and ankle.  Ok to refer her there is she want to stay locally.  Otherwise you can ask if she wants to go to [place], [facility]

## 2012-11-16 NOTE — Telephone Encounter (Signed)
Will refer pt to Dr Victorino Dike.

## 2012-11-17 NOTE — Telephone Encounter (Signed)
[facility]

## 2012-11-20 ENCOUNTER — Ambulatory Visit: Payer: BC Managed Care – PPO | Admitting: Podiatry

## 2012-12-05 ENCOUNTER — Telehealth: Payer: Self-pay | Admitting: *Deleted

## 2012-12-05 NOTE — Telephone Encounter (Signed)
Pt states needs to get in touch with Bone Growth Stimulator company representative.  I told her I would contact Trey [employer] and have him contact her.  Pt states she was speaking with a female, I told her I would have Thurston Pounds make certain she spoke with the proper person for her situation.  I informed pt and she requested Trey's phone number [phone].

## 2013-01-01 ENCOUNTER — Telehealth: Payer: Self-pay | Admitting: *Deleted

## 2013-01-01 ENCOUNTER — Other Ambulatory Visit: Payer: Self-pay | Admitting: Internal Medicine

## 2013-01-01 MED ORDER — SULFAMETHOXAZOLE-TMP DS 800-160 MG PO TABS: ORAL_TABLET | ORAL | Status: DC

## 2013-01-01 MED ORDER — SCOPOLAMINE 1 MG/3DAYS TD PT72: 1.0000 | MEDICATED_PATCH | TRANSDERMAL | Status: DC

## 2013-01-01 NOTE — Telephone Encounter (Signed)
Rhonda Henry called during the Christmas holiday with a possible UTI.  I called her this morning to check on her. She went to urgent care over the weekend, and they gave her a Rx and she is feeling better.   She will be going out of town the end of January and would like a Rx for a UTI to take with her.  She said she inevitably gets one when she travels, and she would like one as a just in case.    She would also like to get a Rx for the patch for nausea.  She will be on a boat whale watching while on vacation.

## 2013-01-04 HISTORY — PX: LUMBAR EPIDURAL INJECTION: SHX1980

## 2013-01-08 ENCOUNTER — Encounter: Payer: Self-pay | Admitting: Podiatry

## 2013-01-08 ENCOUNTER — Ambulatory Visit (INDEPENDENT_AMBULATORY_CARE_PROVIDER_SITE_OTHER): Payer: Medicare Other

## 2013-01-08 ENCOUNTER — Ambulatory Visit (INDEPENDENT_AMBULATORY_CARE_PROVIDER_SITE_OTHER): Payer: Medicare Other | Admitting: Podiatry

## 2013-01-08 VITALS — BP 152/84 | HR 72 | Resp 16 | Ht 61.0 in | Wt 185.0 lb

## 2013-01-08 DIAGNOSIS — S92309A Fracture of unspecified metatarsal bone(s), unspecified foot, initial encounter for closed fracture: Secondary | ICD-10-CM | POA: Diagnosis not present

## 2013-01-08 DIAGNOSIS — IMO0002 Reserved for concepts with insufficient information to code with codable children: Secondary | ICD-10-CM

## 2013-01-08 NOTE — Patient Instructions (Signed)
Call patient up on receipt of bone stimulator

## 2013-01-09 NOTE — Progress Notes (Signed)
Patient ID: Rhonda Henry, female   DOB: Jul 05, 1945, 68 y.o.   MRN: [id number]  Subjective: This 68 year old white female presents again at 68 complaining of pain at the base of fifth left metatarsal. She has a history of fracture in the base of fifth left metatarsal occurring on 06/12/2012. She has been under the care of orthopedic doctors with immobilization. An MRI image of 09/22/2012 degrees per radiology demonstrated transverse fracture at the base of fifth left metatarsal.  Objective: There is palpable tenderness at the base of fifth left metatarsal without any crepitus. Palpation duplicates the patient's area of discomfort.  Xray report weightbearing left foot dated 01/09/2012  Patient has a history of trauma occurring on 06/12/2012. She is still having pain at the base of fifth left metatarsal.  The base of the fifth left metatarsal demonstrates a visible fracture line without displacement. The lateral aspect of the base of the fifth metatarsal cuboid joint is slightly narrowed.  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 Our office will contact the representative to obtain a bone stimulator and we will notify patient to present for instruction on use of this stimulator.

## 2013-01-10 ENCOUNTER — Telehealth: Payer: Self-pay | Admitting: *Deleted

## 2013-01-10 NOTE — Telephone Encounter (Signed)
Dr Amalia Hailey requested the updated 01/08/2013 dictation be sent to [facility].  Junious Silk [phone] and faxed to [phone].

## 2013-01-22 ENCOUNTER — Other Ambulatory Visit: Payer: Self-pay | Admitting: *Deleted

## 2013-01-23 ENCOUNTER — Ambulatory Visit (INDEPENDENT_AMBULATORY_CARE_PROVIDER_SITE_OTHER): Payer: Medicare Other | Admitting: Internal Medicine

## 2013-01-23 ENCOUNTER — Encounter: Payer: Self-pay | Admitting: Internal Medicine

## 2013-01-23 VITALS — BP 146/85 | HR 70 | Temp 98.2°F | Resp 18 | Wt 197.0 lb

## 2013-01-23 DIAGNOSIS — R197 Diarrhea, unspecified: Secondary | ICD-10-CM

## 2013-01-23 DIAGNOSIS — K5289 Other specified noninfective gastroenteritis and colitis: Secondary | ICD-10-CM

## 2013-01-23 DIAGNOSIS — R109 Unspecified abdominal pain: Secondary | ICD-10-CM | POA: Diagnosis not present

## 2013-01-23 DIAGNOSIS — K529 Noninfective gastroenteritis and colitis, unspecified: Secondary | ICD-10-CM

## 2013-01-23 LAB — CBC WITH DIFFERENTIAL/PLATELET
BASOS PCT: 0 % (ref 0–1)
Basophils Absolute: 0 10*3/uL (ref 0.0–0.1)
Eosinophils Absolute: 0.2 10*3/uL (ref 0.0–0.7)
Eosinophils Relative: 3 % (ref 0–5)
HEMATOCRIT: 39.6 % (ref 36.0–46.0)
HEMOGLOBIN: 13.1 g/dL (ref 12.0–15.0)
LYMPHS PCT: 27 % (ref 12–46)
Lymphs Abs: 1.7 10*3/uL (ref 0.7–4.0)
MCH: 27.2 pg (ref 26.0–34.0)
MCHC: 33.1 g/dL (ref 30.0–36.0)
MCV: 82.3 fL (ref 78.0–100.0)
MONO ABS: 0.3 10*3/uL (ref 0.1–1.0)
MONOS PCT: 5 % (ref 3–12)
NEUTROS ABS: 4.2 10*3/uL (ref 1.7–7.7)
NEUTROS PCT: 65 % (ref 43–77)
Platelets: 260 10*3/uL (ref 150–400)
RBC: 4.81 MIL/uL (ref 3.87–5.11)
RDW: 13.5 % (ref 11.5–15.5)
WBC: 6.4 10*3/uL (ref 4.0–10.5)

## 2013-01-23 MED ORDER — CIPROFLOXACIN HCL 500 MG PO TABS: 500.0000 mg | ORAL_TABLET | Freq: Two times a day (BID) | ORAL | Status: DC

## 2013-01-23 MED ORDER — HYOSCYAMINE SULFATE ER 0.375 MG PO TBCR: 0.3750 mg | EXTENDED_RELEASE_TABLET | Freq: Every day | ORAL | Status: DC
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
## 2013-01-24 ENCOUNTER — Encounter: Payer: Self-pay | Admitting: *Deleted

## 2013-01-24 LAB — COMPREHENSIVE METABOLIC PANEL
ALK PHOS: 66 U/L (ref 39–117)
ALT: 13 U/L (ref 0–35)
AST: 16 U/L (ref 0–37)
Albumin: 3.8 g/dL (ref 3.5–5.2)
BILIRUBIN TOTAL: 0.7 mg/dL (ref 0.3–1.2)
BUN: 19 mg/dL (ref 6–23)
CO2: 28 meq/L (ref 19–32)
Calcium: 8.9 mg/dL (ref 8.4–10.5)
Chloride: 106 mEq/L (ref 96–112)
Creat: 0.82 mg/dL (ref 0.50–1.10)
GLUCOSE: 69 mg/dL — AB (ref 70–99)
POTASSIUM: 4.2 meq/L (ref 3.5–5.3)
Sodium: 140 mEq/L (ref 135–145)
Total Protein: 6 g/dL (ref 6.0–8.3)

## 2013-01-24 LAB — AMYLASE: AMYLASE: 37 U/L (ref 0–105)

## 2013-01-25 ENCOUNTER — Encounter: Payer: Self-pay | Admitting: *Deleted

## 2013-01-25 DIAGNOSIS — M25569 Pain in unspecified knee: Secondary | ICD-10-CM | POA: Diagnosis not present

## 2013-01-27 LAB — STOOL CULTURE

## 2013-01-29 NOTE — Telephone Encounter (Signed)
Called pt to adv all labs are normal - LMOM for her to call back if anything further

## 2013-01-29 NOTE — Telephone Encounter (Signed)
Message copied by Gretchen Short on Mon Jan 29, 2013  9:14 AM ------      Message from: Emi Belfast D      Created: Mon Jan 29, 2013  8:12 AM       [facility]             Call pt and let her know that no bacteria grew in her stool culture ------

## 2013-02-16 ENCOUNTER — Encounter: Payer: Self-pay | Admitting: Gastroenterology

## 2013-02-16 ENCOUNTER — Telehealth: Payer: Self-pay | Admitting: Gastroenterology

## 2013-02-16 ENCOUNTER — Ambulatory Visit (INDEPENDENT_AMBULATORY_CARE_PROVIDER_SITE_OTHER): Payer: Medicare Other | Admitting: Gastroenterology

## 2013-02-16 VITALS — BP 138/80 | HR 72 | Ht 61.0 in | Wt 198.0 lb

## 2013-02-16 DIAGNOSIS — R109 Unspecified abdominal pain: Secondary | ICD-10-CM

## 2013-02-16 DIAGNOSIS — R1013 Epigastric pain: Secondary | ICD-10-CM

## 2013-02-16 DIAGNOSIS — Z86010 Personal history of colonic polyps: Secondary | ICD-10-CM | POA: Diagnosis not present
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
## 2013-02-16 NOTE — Assessment & Plan Note (Signed)
Patient apparently underwent colonoscopy with removal of polyps in 2008. I will obtain pathology report and make further recommendations.

## 2013-02-16 NOTE — Patient Instructions (Signed)
You have been scheduled for an abdominal ultrasound at [facility] (1st floor of hospital) on 02/21/2013 at 8:30am. Please arrive 15 minutes prior to your appointment for registration. Make certain not to have anything to eat or drink 6 hours prior to your appointment. Should you need to reschedule your appointment, please contact radiology at [phone]. This test typically takes about 30 minutes to perform.  You have been scheduled for an appointment with _________ at [facility]. Your appointment is on __________ at _______________. Please arrive at ___________ for registration. Make certain to bring a list of current medications, including any over the counter medications or vitamins. Also bring your co-pay if you have one as well as your insurance cards. [facility] is located at [address]. Should you need to reschedule your appointment, please contact them at [phone].

## 2013-02-16 NOTE — Assessment & Plan Note (Addendum)
I remain suspicious that pain is 2 to chronic cholecystitis per although no stones were seen by CT scan injection of CCK during HIDA exam didn't elicit pain.  Parent and description of pain suggests biliary tract disease.  Note normal liver tests.  She gets partial relief with anticholinergics.  Recommendations #1 abdominal ultrasound #2 referred to Gen. Surgery  (Dr. Gershon Crane)  for consideration of cholecystectomy. #3 trial of PPI therapy

## 2013-02-16 NOTE — Telephone Encounter (Signed)
Pt states she has been having abdominal pain again and would like to be seen sooner. Pt scheduled to see Dr. Deatra Ina today at 1:45pm. Pt aware of appt.

## 2013-02-18 ENCOUNTER — Encounter (HOSPITAL_COMMUNITY): Payer: Self-pay | Admitting: Emergency Medicine

## 2013-02-18 ENCOUNTER — Emergency Department (HOSPITAL_COMMUNITY): Payer: Medicare Other

## 2013-02-18 ENCOUNTER — Emergency Department (HOSPITAL_COMMUNITY)
Admission: EM | Admit: 2013-02-18 | Discharge: 2013-02-18 | Disposition: A | Discharge status: Home / Self Care | Payer: Medicare Other | Attending: Emergency Medicine | Admitting: Emergency Medicine

## 2013-02-18 DIAGNOSIS — F411 Generalized anxiety disorder: Secondary | ICD-10-CM | POA: Diagnosis not present

## 2013-02-18 DIAGNOSIS — M129 Arthropathy, unspecified: Secondary | ICD-10-CM | POA: Diagnosis not present

## 2013-02-18 DIAGNOSIS — Z8719 Personal history of other diseases of the digestive system: Secondary | ICD-10-CM | POA: Diagnosis not present

## 2013-02-18 DIAGNOSIS — R1011 Right upper quadrant pain: Secondary | ICD-10-CM | POA: Diagnosis not present

## 2013-02-18 DIAGNOSIS — Z8639 Personal history of other endocrine, nutritional and metabolic disease: Secondary | ICD-10-CM | POA: Insufficient documentation

## 2013-02-18 DIAGNOSIS — Z87448 Personal history of other diseases of urinary system: Secondary | ICD-10-CM | POA: Diagnosis not present

## 2013-02-18 DIAGNOSIS — Z862 Personal history of diseases of the blood and blood-forming organs and certain disorders involving the immune mechanism: Secondary | ICD-10-CM | POA: Diagnosis not present

## 2013-02-18 DIAGNOSIS — R110 Nausea: Secondary | ICD-10-CM | POA: Insufficient documentation

## 2013-02-18 DIAGNOSIS — Z8742 Personal history of other diseases of the female genital tract: Secondary | ICD-10-CM | POA: Insufficient documentation

## 2013-02-18 DIAGNOSIS — I10 Essential (primary) hypertension: Secondary | ICD-10-CM | POA: Insufficient documentation

## 2013-02-18 DIAGNOSIS — Z79899 Other long term (current) drug therapy: Secondary | ICD-10-CM | POA: Insufficient documentation

## 2013-02-18 DIAGNOSIS — G8929 Other chronic pain: Secondary | ICD-10-CM | POA: Insufficient documentation

## 2013-02-18 DIAGNOSIS — D1803 Hemangioma of intra-abdominal structures: Secondary | ICD-10-CM | POA: Diagnosis not present

## 2013-02-18 LAB — COMPREHENSIVE METABOLIC PANEL
ALT: 12 U/L (ref 0–35)
AST: 15 U/L (ref 0–37)
Albumin: 3.4 g/dL — ABNORMAL LOW (ref 3.5–5.2)
Alkaline Phosphatase: 71 U/L (ref 39–117)
BUN: 16 mg/dL (ref 6–23)
CALCIUM: 9.5 mg/dL (ref 8.4–10.5)
CO2: 27 mEq/L (ref 19–32)
Chloride: 105 mEq/L (ref 96–112)
Creatinine, Ser: 0.91 mg/dL (ref 0.50–1.10)
GFR calc non Af Amer: 63 mL/min — ABNORMAL LOW (ref >90)
GFR, EST AFRICAN AMERICAN: 73 mL/min — AB (ref >90)
GLUCOSE: 107 mg/dL — AB (ref 70–99)
Potassium: 3.8 mEq/L (ref 3.7–5.3)
SODIUM: 142 meq/L (ref 137–147)
TOTAL PROTEIN: 6.7 g/dL (ref 6.0–8.3)
Total Bilirubin: 0.5 mg/dL (ref 0.3–1.2)

## 2013-02-18 LAB — URINALYSIS, ROUTINE W REFLEX MICROSCOPIC
Bilirubin Urine: NEGATIVE
Glucose, UA: NEGATIVE mg/dL
Ketones, ur: 15 mg/dL — AB
LEUKOCYTES UA: NEGATIVE
NITRITE: NEGATIVE
Protein, ur: NEGATIVE mg/dL
SPECIFIC GRAVITY, URINE: 1.02 (ref 1.005–1.030)
UROBILINOGEN UA: 0.2 mg/dL (ref 0.0–1.0)
pH: 5 (ref 5.0–8.0)

## 2013-02-18 LAB — CBC WITH DIFFERENTIAL/PLATELET
Basophils Absolute: 0 10*3/uL (ref 0.0–0.1)
Basophils Relative: 0 % (ref 0–1)
EOS ABS: 0.1 10*3/uL (ref 0.0–0.7)
Eosinophils Relative: 2 % (ref 0–5)
HCT: 39.2 % (ref 36.0–46.0)
Hemoglobin: 13 g/dL (ref 12.0–15.0)
Lymphocytes Relative: 23 % (ref 12–46)
Lymphs Abs: 1.6 10*3/uL (ref 0.7–4.0)
MCH: 28 pg (ref 26.0–34.0)
MCHC: 33.2 g/dL (ref 30.0–36.0)
MCV: 84.5 fL (ref 78.0–100.0)
Monocytes Absolute: 0.4 10*3/uL (ref 0.1–1.0)
Monocytes Relative: 5 % (ref 3–12)
Neutro Abs: 5.1 10*3/uL (ref 1.7–7.7)
Neutrophils Relative %: 70 % (ref 43–77)
PLATELETS: 253 10*3/uL (ref 150–400)
RBC: 4.64 MIL/uL (ref 3.87–5.11)
RDW: 13.1 % (ref 11.5–15.5)
WBC: 7.3 10*3/uL (ref 4.0–10.5)

## 2013-02-18 LAB — URINE MICROSCOPIC-ADD ON

## 2013-02-18 LAB — LIPASE, BLOOD: Lipase: 15 U/L (ref 11–59)

## 2013-02-18 MED ORDER — ONDANSETRON HCL 4 MG/2ML IJ SOLN
4.0000 mg | Freq: Once | INTRAMUSCULAR | Status: AC
  Administered 2013-02-18: 4 mg via INTRAVENOUS
  Filled 2013-02-18: qty 2

## 2013-02-18 MED ORDER — SODIUM CHLORIDE 0.9 % IV BOLUS (SEPSIS)
1000.0000 mL | Freq: Once | INTRAVENOUS | Status: AC
  Administered 2013-02-18: 1000 mL via INTRAVENOUS

## 2013-02-18 MED ORDER — FENTANYL CITRATE 0.05 MG/ML IJ SOLN
50.0000 ug | Freq: Once | INTRAMUSCULAR | Status: AC
  Administered 2013-02-18: 50 ug via INTRAVENOUS
  Filled 2013-02-18: qty 2

## 2013-02-18 MED ORDER — ONDANSETRON HCL 4 MG PO TABS: 4.0000 mg | ORAL_TABLET | Freq: Four times a day (QID) | ORAL | Status: DC

## 2013-02-18 MED ORDER — HYDROMORPHONE HCL PF 1 MG/ML IJ SOLN
1.0000 mg | Freq: Once | INTRAMUSCULAR | Status: AC
  Administered 2013-02-18: 1 mg via INTRAVENOUS
  Filled 2013-02-18: qty 1

## 2013-02-18 MED ORDER — HYDROCODONE-ACETAMINOPHEN 5-325 MG PO TABS: 1.0000 | ORAL_TABLET | Freq: Four times a day (QID) | ORAL | Status: DC | PRN

## 2013-02-18 NOTE — ED Provider Notes (Signed)
CSN: [id number]     Arrival date & time 02/18/13  1339 History   First MD Initiated Contact with Patient 02/18/13 1500     Chief Complaint  Patient presents with  . Abdominal Pain    right sided     (Consider location/radiation/quality/duration/timing/severity/associated sxs/prior Treatment) Patient is a 68 y.o. female presenting with abdominal pain.  Abdominal Pain Pain location:  RUQ Pain quality: aching and burning   Pain radiates to:  R flank Pain severity:  Severe Onset quality:  Gradual Duration: gradually worsening for about a week, suddenly worsened today after eating a few hours ago. Timing:  Constant Progression:  Worsening Chronicity:  Recurrent Context comment:  Has taken hyoscyamine in past for similar symptoms.   Relieved by: rest. Worsened by:  Eating Ineffective treatments:  None tried Associated symptoms: nausea   Associated symptoms: no chest pain, no cough, no diarrhea, no fever, no shortness of breath and no vomiting     Past Medical History  Diagnosis Date  . Nontoxic multinodular goiter   . Other voice and resonance disorders   . Acute sinusitis, unspecified   . Esophageal reflux   . Goiter, unspecified   . Cramp of limb   . Lumbago   . Burn of unspecified degree of forearm   . Persistent disorder of initiating or maintaining sleep   . Personal history of unspecified urinary disorder   . Other chronic pain   . Unspecified essential hypertension   . Depressive disorder, not elsewhere classified   . Anxiety state, unspecified   . Antritis (stomach)   . Arthritis   . Menopause   . Paresthesia     R facial, s/p facelift   Past Surgical History  Procedure Laterality Date  . Facial cosmetic surgery  7-10    compl by thick scars and a scar necrosis on R cheek  . Breast biopsies    . Breast lumpectomy      x 2  . Refractive surgery  1997  . Lumbar epidural injection  Jan 15    Family History  Problem Relation Age of Onset  . Hypertension  Other   . Thyroid cancer Mother   . Heart disease Mother   . Macular degeneration Mother   . Glaucoma Father   . Colon cancer Neg Hx    History  Substance Use Topics  . Smoking status: Never Smoker   . Smokeless tobacco: Never Used  . Alcohol Use: Yes     Comment: socially   OB History   Grav Para Term Preterm Abortions TAB SAB Ect Mult Living   3 2   1           Review of Systems  Constitutional: Negative for fever.  HENT: Negative for congestion.   Respiratory: Negative for cough and shortness of breath.   Cardiovascular: Negative for chest pain.  Gastrointestinal: Positive for nausea and abdominal pain. Negative for vomiting and diarrhea.  All other systems reviewed and are negative.      Allergies  Review of patient's allergies indicates no known allergies.  Home Medications   Current Outpatient Rx  Name  Route  Sig  Dispense  Refill  . diazepam (VALIUM) 5 MG tablet   Oral   Take 1 tablet (5 mg total) by mouth every 12 (twelve) hours as needed for anxiety.   30 tablet   1   . diclofenac (VOLTAREN) 75 MG EC tablet   Oral   Take 75 mg by mouth daily  as needed for mild pain.         Marland Kitchen Hyoscyamine Sulfate 0.375 MG TBCR   Oral   Take 1 tablet (0.375 mg total) by mouth daily.   30 each   1    BP 184/91  Pulse 99  Temp(Src) 98.8 F (37.1 C) (Oral)  Resp 20  SpO2 97% Physical Exam  Nursing note and vitals reviewed. Constitutional: She is oriented to person, place, and time. She appears well-developed and well-nourished. No distress.  HENT:  Head: Normocephalic and atraumatic.  Mouth/Throat: Oropharynx is clear and moist.  Eyes: Conjunctivae are normal. Pupils are equal, round, and reactive to light. No scleral icterus.  Neck: Neck supple.  Cardiovascular: Normal rate, regular rhythm, normal heart sounds and intact distal pulses.   No murmur heard. Pulmonary/Chest: Effort normal and breath sounds normal. No stridor. No respiratory distress. She has  no rales.  Abdominal: Soft. Bowel sounds are normal. She exhibits no distension. There is tenderness in the right upper quadrant. There is positive Murphy's sign. There is no rigidity, no rebound and no guarding.  Musculoskeletal: Normal range of motion.  Neurological: She is alert and oriented to person, place, and time.  Skin: Skin is warm and dry. No rash noted.  Psychiatric: She has a normal mood and affect. Her behavior is normal.    ED Course  Procedures (including critical care time) Labs Review Labs Reviewed  COMPREHENSIVE METABOLIC PANEL - Abnormal; Notable for the following:    Glucose, Bld 107 (*)    Albumin 3.4 (*)    GFR calc non Af Amer 63 (*)    GFR calc Af Amer 73 (*)    All other components within normal limits  URINALYSIS, ROUTINE W REFLEX MICROSCOPIC - Abnormal; Notable for the following:    APPearance CLOUDY (*)    Hgb urine dipstick MODERATE (*)    Ketones, ur 15 (*)    All other components within normal limits  CBC WITH DIFFERENTIAL  LIPASE, BLOOD  URINE MICROSCOPIC-ADD ON   Imaging Review US Abdomen Complete  02/18/2013   CLINICAL DATA:  Right-sided abdominal pain  EXAM: ULTRASOUND ABDOMEN COMPLETE  COMPARISON:  [facility] CT 09/21/2012  FINDINGS: Gallbladder:  No gallstones or wall thickening visualized. No sonographic Murphy sign noted.  Common bile duct:  Diameter: Normal at 3.5 mm  Liver:  Rounded 1.5 anechoic lesion with through transmission in the left hepatic lobe corresponds to a small hemangioma on comparison CT. No intrahepatic biliary duct dilatation.  IVC:  No abnormality visualized.  Pancreas:  Visualized portion unremarkable.  Spleen:  Size and appearance within normal limits.  Right Kidney:  Length: 10.0 cm . Echogenicity within normal limits. No mass or hydronephrosis visualized.  Left Kidney:  Length: 9.7 cm. Echogenicity within normal limits. No mass or hydronephrosis visualized.  Abdominal aorta:  No aneurysm visualized.  Other findings:   No free fluid  IMPRESSION: 1. No acute abdominal findings. 2. Normal gallbladder. 3. Small hemangioma within the left hepatic lobe.   Electronically Signed   By: Suzy Bouchard M.D.   On: 02/18/2013 19:09    EKG Interpretation   None       MDM   Final diagnoses:  RUQ abdominal pain    68 yo female with RUQ abdominal pain, acutely worsened after eating today.  Symptoms most consistent with biliary pathology.  No evidence of acute cholecystitis on abdominal US.  Remainder of ultrasound negative including normal kidneys and no AAA.  Pain controlled with IV meds.  She has follow up with a surgeon in 3 days for further evaluation of this pain.  She will also follow up with her primary doctor.    Of note, she has blood in her urine, but I have a low suspicion for kidney stone.  She also reports that her urinalyses always show positive Hg.  She has been worked up for this in the past.  She appears stable for close outpatient follow up.  Return precautions given,.  Houston Siren III, MD 02/18/13 2104

## 2013-02-18 NOTE — Discharge Instructions (Signed)
Abdominal Pain, Adult °Many things can cause abdominal pain. Usually, abdominal pain is not caused by a disease and will improve without treatment. It can often be observed and treated at home. Your health care provider will do a physical exam and possibly order blood tests and X-rays to help determine the seriousness of your pain. However, in many cases, more time must pass before a clear cause of the pain can be found. Before that point, your health care provider may not know if you need more testing or further treatment. °HOME CARE INSTRUCTIONS  °Monitor your abdominal pain for any changes. The following actions may help to alleviate any discomfort you are experiencing: °· Only take over-the-counter or prescription medicines as directed by your health care provider. °· Do not take laxatives unless directed to do so by your health care provider. °· Try a clear liquid diet (broth, tea, or water) as directed by your health care provider. Slowly move to a bland diet as tolerated. °SEEK MEDICAL CARE IF: °· You have unexplained abdominal pain. °· You have abdominal pain associated with nausea or diarrhea. °· You have pain when you urinate or have a bowel movement. °· You experience abdominal pain that wakes you in the night. °· You have abdominal pain that is worsened or improved by eating food. °· You have abdominal pain that is worsened with eating fatty foods. °SEEK IMMEDIATE MEDICAL CARE IF:  °· Your pain does not go away within 2 hours. °· You have a fever. °· You keep throwing up (vomiting). °· Your pain is felt only in portions of the abdomen, such as the right side or the left lower portion of the abdomen. °· You pass bloody or black tarry stools. °MAKE SURE YOU: °· Understand these instructions.   °· Will watch your condition.   °· Will get help right away if you are not doing well or get worse.   °Document Released: 09/30/2004 Document Revised: 10/11/2012 Document Reviewed: 08/30/2012 °ExitCare® Patient  Information ©2014 [employer]. ° °Biliary Colic  °Biliary colic is a steady or irregular pain in the upper abdomen. It is usually under the right side of the rib cage. It happens when gallstones interfere with the normal flow of bile from the gallbladder. Bile is a liquid that helps to digest fats. Bile is made in the liver and stored in the gallbladder. When you eat a meal, bile passes from the gallbladder through the cystic duct and the common bile duct into the small intestine. There, it mixes with partially digested food. If a gallstone blocks either of these ducts, the normal flow of bile is blocked. The muscle cells in the bile duct contract forcefully to try to move the stone. This causes the pain of biliary colic.  °SYMPTOMS  °· A person with biliary colic usually complains of pain in the upper abdomen. This pain can be: °· In the center of the upper abdomen just below the breastbone. °· In the upper-right part of the abdomen, near the gallbladder and liver. °· Spread back toward the right shoulder blade. °· Nausea and vomiting. °· The pain usually occurs after eating. °· Biliary colic is usually triggered by the digestive system's demand for bile. The demand for bile is high after fatty meals. Symptoms can also occur when a person who has been fasting suddenly eats a very large meal. Most episodes of biliary colic pass after 1 to 5 hours. After the most intense pain passes, your abdomen may continue to ache mildly for about   24 hours. °DIAGNOSIS  °After you describe your symptoms, your caregiver will perform a physical exam. He or she will pay attention to the upper right portion of your belly (abdomen). This is the area of your liver and gallbladder. An ultrasound will help your caregiver look for gallstones. Specialized scans of the gallbladder may also be done. Blood tests may be done, especially if you have fever or if your pain persists. °PREVENTION  °Biliary colic can be prevented by controlling the  risk factors for gallstones. Some of these risk factors, such as heredity, increasing age, and pregnancy are a normal part of life. Obesity and a high-fat diet are risk factors you can change through a healthy lifestyle. Women going through menopause who take hormone replacement therapy (estrogen) are also more likely to develop biliary colic. °TREATMENT  °· Pain medication may be prescribed. °· You may be encouraged to eat a fat-free diet. °· If the first episode of biliary colic is severe, or episodes of colic keep retuning, surgery to remove the gallbladder (cholecystectomy) is usually recommended. This procedure can be done through small incisions using an instrument called a laparoscope. The procedure often requires a brief stay in the hospital. Some people can leave the hospital the same day. It is the most widely used treatment in people troubled by painful gallstones. It is effective and safe, with no complications in more than 90% of cases. °· If surgery cannot be done, medication that dissolves gallstones may be used. This medication is expensive and can take months or years to work. Only small stones will dissolve. °· Rarely, medication to dissolve gallstones is combined with a procedure called shock-wave lithotripsy. This procedure uses carefully aimed shock waves to break up gallstones. In many people treated with this procedure, gallstones form again within a few years. °PROGNOSIS  °If gallstones block your cystic duct or common bile duct, you are at risk for repeated episodes of biliary colic. There is also a 25% chance that you will develop a gallbladder infection(acute cholecystitis), or some other complication of gallstones within 10 to 20 years. If you have surgery, schedule it at a time that is convenient for you and at a time when you are not sick. °HOME CARE INSTRUCTIONS  °· Drink plenty of clear fluids. °· Avoid fatty, greasy or fried foods, or any foods that make your pain worse. °· Take  medications as directed. °SEEK MEDICAL CARE IF:  °· You develop a fever over 100.5° F (38.1° C). °· Your pain gets worse over time. °· You develop nausea that prevents you from eating and drinking. °· You develop vomiting. °SEEK IMMEDIATE MEDICAL CARE IF:  °· You have continuous or severe belly (abdominal) pain which is not relieved with medications. °· You develop nausea and vomiting which is not relieved with medications. °· You have symptoms of biliary colic and you suddenly develop a fever and shaking chills. This may signal cholecystitis. Call your caregiver immediately. °· You develop a yellow color to your skin or the white part of your eyes (jaundice). °Document Released: 05/24/2005 Document Revised: 03/15/2011 Document Reviewed: 08/03/2007 °ExitCare® Patient Information ©2014 [employer]. ° °

## 2013-02-18 NOTE — ED Notes (Signed)
Patient states she has had right sided abd pain onset 2 days ago, was seen in 2012 for the same and was told she had an underactive gallbladder. Patient was started on medication and has had little to no problem since then. Patient states she saw Dr Deatra Ina (GI) on Thursday, was scheduled for [place] and surgical consult on Wednesday. Patient states pain is now unbearable and has nausea and vomiting with eating.

## 2013-02-19 ENCOUNTER — Telehealth: Payer: Self-pay | Admitting: *Deleted

## 2013-02-19 ENCOUNTER — Telehealth: Payer: Self-pay | Admitting: Gastroenterology

## 2013-02-19 NOTE — Telephone Encounter (Signed)
Pt has been rescheduled with Dr. Rosendo Gros at [facility] for 03/01/13@9 :20am. Pt needs to arrive there at 8:50am. Pt wants to know if there are any other tests that can be done prior to this appt. She had [place] in ER This weekend. Please advise.

## 2013-02-19 NOTE — Telephone Encounter (Signed)
Message copied by Algernon Huxley on Mon Feb 19, 2013 11:12 AM ------      Message from: Mason Jim, AMY L      Created: Mon Feb 19, 2013 10:31 AM       Pt said she wanted Dr. Deatra Ina know that Dr. [name] office cancelled her appt for new ov.  Wants to know who else he recommends for appt?            Thanks      Amy ------

## 2013-02-19 NOTE — Telephone Encounter (Signed)
Rhonda Henry called upset that her surgeon (for gallbladder)  Called and cancelled her appointment due to weather.  She wants to know who you recommend that she see.  I told her that we can not get her in any sooner especially with the upcoming weather.  She said Rhonda Henry office would have to make appt she just wants reccomendation.

## 2013-02-19 NOTE — Telephone Encounter (Signed)
Spoke with pt and she is aware. [facility] has rescheduled her appt to this Friday 02/23/13@9 :40am. Pt aware.

## 2013-02-19 NOTE — Telephone Encounter (Signed)
Pt still having abdominal pain. States over the weekend she had to go to the ER due to the pain. Ultrasound was done but they did not find anything. Pt wants to know if there is anything else testing wise she can have done. Please advise.

## 2013-02-19 NOTE — Telephone Encounter (Signed)
Doesn't need any other testing for now, just evaluation by surgery.

## 2013-02-21 ENCOUNTER — Ambulatory Visit (HOSPITAL_COMMUNITY): Admission: RE | Admit: 2013-02-21 | Payer: Medicare Other | Source: Ambulatory Visit

## 2013-02-21 ENCOUNTER — Ambulatory Visit (INDEPENDENT_AMBULATORY_CARE_PROVIDER_SITE_OTHER): Payer: Medicare Other | Admitting: Surgery

## 2013-02-22 ENCOUNTER — Telehealth: Payer: Self-pay | Admitting: *Deleted

## 2013-02-22 DIAGNOSIS — R109 Unspecified abdominal pain: Secondary | ICD-10-CM | POA: Diagnosis not present

## 2013-02-22 DIAGNOSIS — R3129 Other microscopic hematuria: Secondary | ICD-10-CM | POA: Diagnosis not present

## 2013-02-22 NOTE — Telephone Encounter (Signed)
Rhonda Henry called and said she saw Dr Rhonda Henry at [facility], and she asked them to send [place] office notes.  They said they do not do that.  She wanted to know if you could look them up or get them.  Dr Rhonda Henry that he did not see and cancer or hernia.  Rhonda Henry is to have an ultrasound done.

## 2013-02-22 NOTE — Telephone Encounter (Signed)
Rhonda Henry  Let Ariadna know that I like Dr. Zella Richer

## 2013-02-23 ENCOUNTER — Ambulatory Visit (INDEPENDENT_AMBULATORY_CARE_PROVIDER_SITE_OTHER): Payer: Medicare Other | Admitting: Surgery

## 2013-02-23 ENCOUNTER — Other Ambulatory Visit (INDEPENDENT_AMBULATORY_CARE_PROVIDER_SITE_OTHER): Payer: Self-pay | Admitting: Surgery

## 2013-02-23 ENCOUNTER — Encounter (INDEPENDENT_AMBULATORY_CARE_PROVIDER_SITE_OTHER): Payer: Self-pay | Admitting: Surgery

## 2013-02-23 VITALS — BP 138/82 | HR 74 | Resp 16 | Ht 61.0 in | Wt 195.0 lb

## 2013-02-23 DIAGNOSIS — R1011 Right upper quadrant pain: Secondary | ICD-10-CM

## 2013-02-23 NOTE — Progress Notes (Signed)
Patient ID: LANDREY MAHURIN, female   DOB: 1945/02/15, 68 y.o.   MRN: [id number]  Chief Complaint  Patient presents with  . Other    Eval biliary disease    HPI JERRIKA LEDLOW is a 68 y.o. female.  Referred by Dr. Erskine Emery for evaluation of chronic RUQ symptoms HPI This is a 68 yo female that presents with four years of intermittent attacks of severe RUQ abdominal pain, associated with abdominal bloating, nausea, and some bowel movements changes.  She had a CCK-HIDA scan in 2011 that showed an ejection fraction of 54%.  Recent ultrasound showed no stones.  CT scan was unremarkable.  Colonoscopy a few years ago was unremarkable.  Her symptoms have become more severe and more frequent.  She gets symptoms even with plain chicken soup.  She had one recent ED visit for severe pain.  The patient is hesitant to consider more surgery since she has had significant wound healing issues after facelift and eye surgery.  Past Medical History  Diagnosis Date  . Nontoxic multinodular goiter   . Other voice and resonance disorders   . Acute sinusitis, unspecified   . Esophageal reflux   . Goiter, unspecified   . Cramp of limb   . Lumbago   . Burn of unspecified degree of forearm   . Persistent disorder of initiating or maintaining sleep   . Personal history of unspecified urinary disorder   . Other chronic pain   . Unspecified essential hypertension   . Depressive disorder, not elsewhere classified   . Anxiety state, unspecified   . Antritis (stomach)   . Arthritis   . Menopause   . Paresthesia     R facial, s/p facelift    Past Surgical History  Procedure Laterality Date  . Facial cosmetic surgery  7-10    compl by thick scars and a scar necrosis on R cheek  . Breast biopsies    . Breast lumpectomy      x 2  . Refractive surgery  1997  . Lumbar epidural injection  Jan 15     Family History  Problem Relation Age of Onset  . Hypertension Other   . Thyroid cancer Mother    . Heart disease Mother   . Macular degeneration Mother   . Glaucoma Father   . Colon cancer Neg Hx     Social History History  Substance Use Topics  . Smoking status: Never Smoker   . Smokeless tobacco: Never Used  . Alcohol Use: Yes     Comment: socially    No Known Allergies  Current Outpatient Prescriptions  Medication Sig Dispense Refill  . diazepam (VALIUM) 5 MG tablet Take 1 tablet (5 mg total) by mouth every 12 (twelve) hours as needed for anxiety.  30 tablet  1  . diclofenac (VOLTAREN) 75 MG EC tablet Take 75 mg by mouth daily as needed for mild pain.      Marland Kitchen Hyoscyamine Sulfate 0.375 MG TBCR Take 1 tablet (0.375 mg total) by mouth daily.  30 each  1  . HYDROcodone-acetaminophen (NORCO/VICODIN) 5-325 MG per tablet Take 1-2 tablets by mouth every 6 (six) hours as needed for moderate pain or severe pain.  12 tablet  0  . ondansetron (ZOFRAN) 4 MG tablet Take 1 tablet (4 mg total) by mouth every 6 (six) hours.  12 tablet  0   Current Facility-Administered Medications  Medication Dose Route Frequency Provider Last Rate Last Dose  . levalbuterol (XOPENEX)  nebulizer solution 0.63 mg  0.63 mg Nebulization Once Wardell Honour, MD        Review of Systems Review of Systems  Constitutional: Positive for chills. Negative for fever and unexpected weight change.  HENT: Negative for congestion, hearing loss, sore throat, trouble swallowing and voice change.   Eyes: Negative for visual disturbance.  Respiratory: Negative for cough and wheezing.   Cardiovascular: Positive for leg swelling. Negative for chest pain and palpitations.  Gastrointestinal: Positive for abdominal pain. Negative for nausea, vomiting, diarrhea, constipation, blood in stool, abdominal distention and anal bleeding.  Genitourinary: Negative for hematuria, vaginal bleeding and difficulty urinating.  Musculoskeletal: Positive for arthralgias.  Skin: Negative for rash and wound.  Neurological: Negative for  seizures, syncope and headaches.  Hematological: Negative for adenopathy. Does not bruise/bleed easily.  Psychiatric/Behavioral: Negative for confusion.    Blood pressure 138/82, pulse 74, resp. rate 16, height 5\' 1"  (1.549 m), weight 195 lb (88.451 kg).  Physical Exam Physical Exam WDWN in NAD HEENT:  EOMI, sclera anicteric Neck:  No masses, no thyromegaly Lungs:  CTA bilaterally; normal respiratory effort CV:  Regular rate and rhythm; no murmurs Abd:  +bowel sounds, soft, mildly distended; no significant tenderness; no palpable masses Ext:  Well-perfused; no edema Skin:  Warm, dry; no sign of jaundice  Data Reviewed US Abdomen Complete  02/18/2013   CLINICAL DATA:  Right-sided abdominal pain  EXAM: ULTRASOUND ABDOMEN COMPLETE  COMPARISON:  [facility] CT 09/21/2012  FINDINGS: Gallbladder:  No gallstones or wall thickening visualized. No sonographic Murphy sign noted.  Common bile duct:  Diameter: Normal at 3.5 mm  Liver:  Rounded 1.5 anechoic lesion with through transmission in the left hepatic lobe corresponds to a small hemangioma on comparison CT. No intrahepatic biliary duct dilatation.  IVC:  No abnormality visualized.  Pancreas:  Visualized portion unremarkable.  Spleen:  Size and appearance within normal limits.  Right Kidney:  Length: 10.0 cm . Echogenicity within normal limits. No mass or hydronephrosis visualized.  Left Kidney:  Length: 9.7 cm. Echogenicity within normal limits. No mass or hydronephrosis visualized.  Abdominal aorta:  No aneurysm visualized.  Other findings:  No free fluid  IMPRESSION: 1. No acute abdominal findings. 2. Normal gallbladder. 3. Small hemangioma within the left hepatic lobe.   Electronically Signed   By: Suzy Bouchard M.D.   On: 02/18/2013 19:09    Lab Results  Component Value Date   ALT 12 02/18/2013   AST 15 02/18/2013   ALKPHOS 71 02/18/2013   BILITOT 0.5 02/18/2013    Lab Results  Component Value Date   WBC 7.3 02/18/2013   HGB 13.0  02/18/2013   HCT 39.2 02/18/2013   MCV 84.5 02/18/2013   PLT 253 02/18/2013   Lab Results  Component Value Date   CREATININE 0.91 02/18/2013   BUN 16 02/18/2013   NA 142 02/18/2013   K 3.8 02/18/2013   CL 105 02/18/2013   CO2 27 02/18/2013     Assessment    Chronic intermittent RUQ abdominal pain - consistent with biliary colic.      Plan    I think the patient would benefit from elective cholecystectomy.  Her symptoms are classic for gallbladder disease.  Alternatively we could repeat her CCK HIDA scan, but because of the severe symptoms, I would recommend proceeding with surgery.    Laparoscopic cholecystectomy with intraoperative cholangiogram. The surgical procedure has been discussed with the patient.  Potential risks, benefits, alternative treatments, and expected  outcomes have been explained.  All of the patient's questions at this time have been answered.  The likelihood of reaching the patient's treatment goal is good.  The patient understand the proposed surgical procedure and wishes to proceed. She will think about it and will call back to schedule surgery.       Alaze Garverick K. 02/23/2013, 1:43 PM

## 2013-02-23 NOTE — Patient Instructions (Signed)
Call our surgery schedulers at [phone] when you are ready to schedule surgery

## 2013-02-26 ENCOUNTER — Encounter: Payer: Self-pay | Admitting: Internal Medicine

## 2013-02-26 DIAGNOSIS — E042 Nontoxic multinodular goiter: Secondary | ICD-10-CM | POA: Insufficient documentation

## 2013-02-27 ENCOUNTER — Telehealth: Payer: Self-pay | Admitting: *Deleted

## 2013-02-27 NOTE — Telephone Encounter (Signed)
Called Dr Amalia Hailey office for OV notes LVM message to return call or fax OV notes

## 2013-02-28 ENCOUNTER — Telehealth: Payer: Self-pay | Admitting: Gastroenterology

## 2013-02-28 ENCOUNTER — Encounter: Payer: Self-pay | Admitting: Gastroenterology

## 2013-02-28 ENCOUNTER — Ambulatory Visit (INDEPENDENT_AMBULATORY_CARE_PROVIDER_SITE_OTHER): Payer: Medicare Other | Admitting: Gastroenterology

## 2013-02-28 VITALS — BP 126/84 | HR 84 | Ht 61.0 in | Wt 194.0 lb

## 2013-02-28 DIAGNOSIS — Z86010 Personal history of colonic polyps: Secondary | ICD-10-CM | POA: Diagnosis not present

## 2013-02-28 DIAGNOSIS — R1011 Right upper quadrant pain: Secondary | ICD-10-CM | POA: Diagnosis not present

## 2013-02-28 NOTE — Progress Notes (Signed)
          History of Present Illness:  The patient continues to complain of fullness and discomfort in the right flank and right upper quadrants radiating around to the back.  She has excess gas orally and aborally that does not relieved the discomfort.  She is scheduled for cholecystectomy.    Review of Systems: Pertinent positive and negative review of systems were noted in the above HPI section. All other review of systems were otherwise negative.    Current Medications, Allergies, Past Medical History, Past Surgical History, Family History and Social History were reviewed in [facility] record  Vital signs were reviewed in today's medical record. Physical Exam: General: Well developed , well nourished, no acute distress Skin: anicteric On abdominal exam there is mild diffuse tenderness without guarding or rebound.  There is no organomegaly  See Assessment and Plan under Problem List

## 2013-02-28 NOTE — Assessment & Plan Note (Signed)
Abdominal complaints are probably related to chronic cholecystitis.  Will hold further workup until she undergoes cholecystectomy and reevaluate if she still having discomfort

## 2013-02-28 NOTE — Telephone Encounter (Signed)
Pt states her pain she was having has moved and she would like to be seen before she goes ahead with gallbladder surgery. Pt scheduled to see Dr. Deatra Ina today at 10:30am. Pt aware of appt.

## 2013-02-28 NOTE — Assessment & Plan Note (Signed)
Last reported I saw was a 2006 colonoscopy demonstrating diverticulosis.  I've asked the patient to get a copy of her prior records.

## 2013-02-28 NOTE — Patient Instructions (Signed)
We will try to obtain a copy of your colonoscopy from Dr. Earlean Shawl.  Please follow up with Dr. Deatra Ina in about one month

## 2013-03-01 ENCOUNTER — Ambulatory Visit (INDEPENDENT_AMBULATORY_CARE_PROVIDER_SITE_OTHER): Payer: Medicare Other | Admitting: General Surgery

## 2013-03-02 ENCOUNTER — Encounter (HOSPITAL_COMMUNITY): Payer: Self-pay | Admitting: Pharmacy Technician

## 2013-03-05 ENCOUNTER — Encounter (HOSPITAL_COMMUNITY): Payer: Self-pay

## 2013-03-05 ENCOUNTER — Encounter (HOSPITAL_COMMUNITY)
Admission: RE | Admit: 2013-03-05 | Discharge: 2013-03-05 | Disposition: A | Discharge status: Home / Self Care | Payer: Medicare Other | Source: Ambulatory Visit | Attending: Surgery | Admitting: Surgery

## 2013-03-05 ENCOUNTER — Other Ambulatory Visit (HOSPITAL_COMMUNITY): Payer: Self-pay | Admitting: *Deleted

## 2013-03-05 DIAGNOSIS — Z01818 Encounter for other preprocedural examination: Secondary | ICD-10-CM | POA: Insufficient documentation

## 2013-03-05 HISTORY — DX: Sleep disorder, unspecified: G47.9

## 2013-03-05 HISTORY — DX: Constipation, unspecified: K59.00

## 2013-03-05 HISTORY — DX: Nausea: R11.0

## 2013-03-05 HISTORY — DX: Abdominal distension (gaseous): R14.0

## 2013-03-05 HISTORY — DX: Other microscopic hematuria: R31.29

## 2013-03-05 HISTORY — DX: Right upper quadrant pain: R10.11

## 2013-03-05 HISTORY — DX: Anxiety disorder, unspecified: F41.9

## 2013-03-05 HISTORY — DX: Urinary tract infection, site not specified: N39.0

## 2013-03-05 HISTORY — DX: Adverse effect of unspecified anesthetic, initial encounter: T41.45XA

## 2013-03-05 HISTORY — DX: Other complications of anesthesia, initial encounter: T88.59XA

## 2013-03-05 NOTE — Progress Notes (Signed)
Labs done 02/18/13  in [facility]

## 2013-03-05 NOTE — Patient Instructions (Addendum)
DELSA WALDER  03/05/2013                           YOUR PROCEDURE IS SCHEDULED ON:  03/13/13               PLEASE REPORT TO SHORT STAY CENTER AT : 5:30 AM               CALL THIS NUMBER IF ANY PROBLEMS THE DAY OF SURGERY :               [phone]                                REMEMBER:   Do not eat food or drink liquids AFTER MIDNIGHT   May have clear liquids UNTIL 6 HOURS BEFORE SURGERY               Take these medicines the morning of surgery with A SIP OF WATER:  HYDROCODONE IF NEEDED   Do not wear jewelry, make-up   Do not wear lotions, powders, or perfumes.   Do not shave legs or underarms 12 hrs. before surgery (men may shave face)  Do not bring valuables to the hospital.  Contacts, dentures or bridgework may not be worn into surgery.  Leave suitcase in the car. After surgery it may be brought to your room.  For patients admitted to the hospital more than one night, checkout time is            11:00 AM                                                       The day of discharge.   Patients discharged the day of surgery will not be allowed to drive home.            If going home same day of surgery, must have someone stay with you              FIRST 24 hrs at home and arrange for some one to drive you              home from hospital.    Special Instructions             Please read over the following fact sheets that you were given:               1. [place]               2. DISCONTINUE ASPIRIN AND HERBAL MEDS 5 DAYS PREOP                                                X_____________________________________________________________________        Failure to follow these instructions may result in cancellation of your surgery

## 2013-03-06 ENCOUNTER — Encounter: Payer: Self-pay | Admitting: Gastroenterology

## 2013-03-06 NOTE — Progress Notes (Signed)
Patient ID: Rhonda Henry, female   DOB: January 13, 1945, 69 y.o.   MRN: [id number]  Records reviewed. Colonoscopy 2006 demonstrated hemorrhoids and diverticulosis.

## 2013-03-13 ENCOUNTER — Ambulatory Visit (HOSPITAL_COMMUNITY)
Admission: RE | Admit: 2013-03-13 | Discharge: 2013-03-13 | Disposition: A | Discharge status: Home / Self Care | Payer: Medicare Other | Source: Ambulatory Visit | Attending: Surgery | Admitting: Surgery

## 2013-03-13 ENCOUNTER — Ambulatory Visit (HOSPITAL_COMMUNITY): Payer: Medicare Other | Admitting: Anesthesiology

## 2013-03-13 ENCOUNTER — Encounter (HOSPITAL_COMMUNITY)
Admission: RE | Disposition: A | Discharge status: Home / Self Care | Payer: Self-pay | Source: Ambulatory Visit | Attending: Surgery

## 2013-03-13 ENCOUNTER — Encounter (HOSPITAL_COMMUNITY): Payer: Medicare Other | Admitting: Anesthesiology

## 2013-03-13 ENCOUNTER — Ambulatory Visit (HOSPITAL_COMMUNITY): Payer: Medicare Other

## 2013-03-13 ENCOUNTER — Encounter (HOSPITAL_COMMUNITY): Payer: Self-pay | Admitting: *Deleted

## 2013-03-13 DIAGNOSIS — F411 Generalized anxiety disorder: Secondary | ICD-10-CM | POA: Diagnosis not present

## 2013-03-13 DIAGNOSIS — K811 Chronic cholecystitis: Secondary | ICD-10-CM

## 2013-03-13 DIAGNOSIS — K219 Gastro-esophageal reflux disease without esophagitis: Secondary | ICD-10-CM | POA: Insufficient documentation

## 2013-03-13 DIAGNOSIS — F3289 Other specified depressive episodes: Secondary | ICD-10-CM | POA: Diagnosis not present

## 2013-03-13 DIAGNOSIS — Z79899 Other long term (current) drug therapy: Secondary | ICD-10-CM | POA: Diagnosis not present

## 2013-03-13 DIAGNOSIS — I10 Essential (primary) hypertension: Secondary | ICD-10-CM | POA: Diagnosis not present

## 2013-03-13 DIAGNOSIS — K8020 Calculus of gallbladder without cholecystitis without obstruction: Secondary | ICD-10-CM | POA: Diagnosis not present

## 2013-03-13 DIAGNOSIS — F329 Major depressive disorder, single episode, unspecified: Secondary | ICD-10-CM | POA: Insufficient documentation

## 2013-03-13 DIAGNOSIS — R109 Unspecified abdominal pain: Secondary | ICD-10-CM | POA: Diagnosis not present

## 2013-03-13 HISTORY — PX: CHOLECYSTECTOMY: SHX55

## 2013-03-13 SURGERY — LAPAROSCOPIC CHOLECYSTECTOMY WITH INTRAOPERATIVE CHOLANGIOGRAM
Anesthesia: General

## 2013-03-13 MED ORDER — EPHEDRINE SULFATE 50 MG/ML IJ SOLN
INTRAMUSCULAR | Status: DC | PRN
  Administered 2013-03-13: 5 mg via INTRAVENOUS

## 2013-03-13 MED ORDER — HYDROCODONE-ACETAMINOPHEN 5-325 MG PO TABS: 1.0000 | ORAL_TABLET | ORAL | Status: DC | PRN

## 2013-03-13 MED ORDER — MIDAZOLAM HCL 2 MG/2ML IJ SOLN
INTRAMUSCULAR | Status: AC
  Filled 2013-03-13: qty 2

## 2013-03-13 MED ORDER — LACTATED RINGERS IV SOLN
INTRAVENOUS | Status: DC | PRN
  Administered 2013-03-13 (×2): via INTRAVENOUS

## 2013-03-13 MED ORDER — ROCURONIUM BROMIDE 100 MG/10ML IV SOLN
INTRAVENOUS | Status: AC
  Filled 2013-03-13: qty 1

## 2013-03-13 MED ORDER — DEXAMETHASONE SODIUM PHOSPHATE 10 MG/ML IJ SOLN
INTRAMUSCULAR | Status: DC | PRN
  Administered 2013-03-13: 10 mg via INTRAVENOUS

## 2013-03-13 MED ORDER — DEXAMETHASONE SODIUM PHOSPHATE 10 MG/ML IJ SOLN
INTRAMUSCULAR | Status: AC
  Filled 2013-03-13: qty 1

## 2013-03-13 MED ORDER — LIDOCAINE HCL (CARDIAC) 20 MG/ML IV SOLN
INTRAVENOUS | Status: DC | PRN
  Administered 2013-03-13: 50 mg via INTRAVENOUS

## 2013-03-13 MED ORDER — HYDRALAZINE HCL 20 MG/ML IJ SOLN
4.0000 mg | Freq: Once | INTRAMUSCULAR | Status: AC
Start: 2013-03-13 — End: 2013-03-13
  Administered 2013-03-13: 4 mg via INTRAVENOUS

## 2013-03-13 MED ORDER — HYDRALAZINE HCL 20 MG/ML IJ SOLN
INTRAMUSCULAR | Status: AC
  Filled 2013-03-13: qty 1

## 2013-03-13 MED ORDER — GLYCOPYRROLATE 0.2 MG/ML IJ SOLN
INTRAMUSCULAR | Status: AC
  Filled 2013-03-13: qty 3

## 2013-03-13 MED ORDER — MEPERIDINE HCL 50 MG/ML IJ SOLN
INTRAMUSCULAR | Status: AC
  Filled 2013-03-13: qty 1

## 2013-03-13 MED ORDER — LACTATED RINGERS IV SOLN
INTRAVENOUS | Status: DC
  Administered 2013-03-13: 1000 mL via INTRAVENOUS

## 2013-03-13 MED ORDER — BUPIVACAINE-EPINEPHRINE PF 0.25-1:200000 % IJ SOLN
INTRAMUSCULAR | Status: AC
  Filled 2013-03-13: qty 30

## 2013-03-13 MED ORDER — PROMETHAZINE HCL 25 MG/ML IJ SOLN
INTRAMUSCULAR | Status: AC
  Filled 2013-03-13: qty 1

## 2013-03-13 MED ORDER — ONDANSETRON HCL 4 MG/2ML IJ SOLN
INTRAMUSCULAR | Status: AC
  Filled 2013-03-13: qty 2

## 2013-03-13 MED ORDER — BUPIVACAINE-EPINEPHRINE 0.25% -1:200000 IJ SOLN
INTRAMUSCULAR | Status: DC | PRN
  Administered 2013-03-13: 15 mL

## 2013-03-13 MED ORDER — LACTATED RINGERS IR SOLN
Status: DC | PRN
  Administered 2013-03-13: 1000 mL

## 2013-03-13 MED ORDER — MIDAZOLAM HCL 5 MG/5ML IJ SOLN
INTRAMUSCULAR | Status: DC | PRN
  Administered 2013-03-13: 2 mg via INTRAVENOUS

## 2013-03-13 MED ORDER — NEOSTIGMINE METHYLSULFATE 1 MG/ML IJ SOLN
INTRAMUSCULAR | Status: DC | PRN
  Administered 2013-03-13: 4 mg via INTRAVENOUS

## 2013-03-13 MED ORDER — ROCURONIUM BROMIDE 100 MG/10ML IV SOLN
INTRAVENOUS | Status: DC | PRN
  Administered 2013-03-13: 45 mg via INTRAVENOUS

## 2013-03-13 MED ORDER — MORPHINE SULFATE 10 MG/ML IJ SOLN: 2.0000 mg | INTRAMUSCULAR | Status: DC | PRN

## 2013-03-13 MED ORDER — FENTANYL CITRATE 0.05 MG/ML IJ SOLN
INTRAMUSCULAR | Status: AC
  Filled 2013-03-13: qty 2

## 2013-03-13 MED ORDER — CEFAZOLIN SODIUM-DEXTROSE 2-3 GM-% IV SOLR
2.0000 g | INTRAVENOUS | Status: AC
  Administered 2013-03-13: 2 g via INTRAVENOUS

## 2013-03-13 MED ORDER — FENTANYL CITRATE 0.05 MG/ML IJ SOLN
25.0000 ug | INTRAMUSCULAR | Status: DC | PRN
  Administered 2013-03-13: 25 ug via INTRAVENOUS

## 2013-03-13 MED ORDER — PROPOFOL 10 MG/ML IV BOLUS
INTRAVENOUS | Status: DC | PRN
  Administered 2013-03-13 (×2): 20 mg via INTRAVENOUS
  Administered 2013-03-13: 160 mg via INTRAVENOUS

## 2013-03-13 MED ORDER — 0.9 % SODIUM CHLORIDE (POUR BTL) OPTIME
TOPICAL | Status: DC | PRN
  Administered 2013-03-13: 1000 mL

## 2013-03-13 MED ORDER — SODIUM CHLORIDE 0.9 % IJ SOLN
INTRAMUSCULAR | Status: DC
Start: 2013-03-13 — End: 2013-03-13
  Filled 2013-03-13: qty 10

## 2013-03-13 MED ORDER — PROMETHAZINE HCL 25 MG/ML IJ SOLN
6.2500 mg | INTRAMUSCULAR | Status: DC | PRN
  Administered 2013-03-13: 6.25 mg via INTRAVENOUS

## 2013-03-13 MED ORDER — GLYCOPYRROLATE 0.2 MG/ML IJ SOLN
INTRAMUSCULAR | Status: AC
  Filled 2013-03-13: qty 1

## 2013-03-13 MED ORDER — GLYCOPYRROLATE 0.2 MG/ML IJ SOLN
INTRAMUSCULAR | Status: DC | PRN
  Administered 2013-03-13: 0.2 mg via INTRAVENOUS
  Administered 2013-03-13: 0.6 mg via INTRAVENOUS

## 2013-03-13 MED ORDER — EPHEDRINE SULFATE 50 MG/ML IJ SOLN
INTRAMUSCULAR | Status: AC
  Filled 2013-03-13: qty 1

## 2013-03-13 MED ORDER — FENTANYL CITRATE 0.05 MG/ML IJ SOLN
INTRAMUSCULAR | Status: DC | PRN
  Administered 2013-03-13: 50 ug via INTRAVENOUS
  Administered 2013-03-13 (×2): 25 ug via INTRAVENOUS
  Administered 2013-03-13: 100 ug via INTRAVENOUS
  Administered 2013-03-13: 50 ug via INTRAVENOUS

## 2013-03-13 MED ORDER — PROPOFOL 10 MG/ML IV BOLUS
INTRAVENOUS | Status: AC
  Filled 2013-03-13: qty 20

## 2013-03-13 MED ORDER — SODIUM CHLORIDE 0.9 % IJ SOLN
INTRAMUSCULAR | Status: AC
  Filled 2013-03-13: qty 10

## 2013-03-13 MED ORDER — ONDANSETRON HCL 4 MG/2ML IJ SOLN
INTRAMUSCULAR | Status: DC | PRN
  Administered 2013-03-13: 4 mg via INTRAVENOUS

## 2013-03-13 MED ORDER — MEPERIDINE HCL 50 MG/ML IJ SOLN
6.2500 mg | INTRAMUSCULAR | Status: DC | PRN
  Administered 2013-03-13 (×2): 6.25 mg via INTRAVENOUS

## 2013-03-13 MED ORDER — CHLORHEXIDINE GLUCONATE 4 % EX LIQD: 1.0000 "application " | Freq: Once | CUTANEOUS | Status: DC

## 2013-03-13 MED ORDER — IOHEXOL 300 MG/ML  SOLN
INTRAMUSCULAR | Status: DC | PRN
  Administered 2013-03-13: 08:00:00

## 2013-03-13 MED ORDER — CEFAZOLIN SODIUM-DEXTROSE 2-3 GM-% IV SOLR
INTRAVENOUS | Status: AC
  Filled 2013-03-13: qty 50

## 2013-03-13 MED ORDER — LIDOCAINE HCL (CARDIAC) 20 MG/ML IV SOLN
INTRAVENOUS | Status: AC
  Filled 2013-03-13: qty 5

## 2013-03-13 MED ORDER — ONDANSETRON HCL 4 MG/2ML IJ SOLN: 4.0000 mg | INTRAMUSCULAR | Status: DC | PRN

## 2013-03-13 MED ORDER — FENTANYL CITRATE 0.05 MG/ML IJ SOLN
INTRAMUSCULAR | Status: AC
  Filled 2013-03-13: qty 5

## 2013-03-13 MED ORDER — NEOSTIGMINE METHYLSULFATE 1 MG/ML IJ SOLN
INTRAMUSCULAR | Status: AC
  Filled 2013-03-13: qty 10

## 2013-03-13 SURGICAL SUPPLY — 42 items
APL SKNCLS STERI-STRIP NONHPOA (GAUZE/BANDAGES/DRESSINGS) ×1
APPLIER CLIP ROT 10 11.4 M/L (STAPLE) ×3
APR CLP MED LRG 11.4X10 (STAPLE) ×1
BAG SPEC RTRVL LRG 6X4 10 (ENDOMECHANICALS) ×1
BENZOIN TINCTURE PRP APPL 2/3 (GAUZE/BANDAGES/DRESSINGS) ×3 IMPLANT
CANISTER SUCTION 2500CC (MISCELLANEOUS) ×3 IMPLANT
CHLORAPREP W/TINT 26ML (MISCELLANEOUS) ×3 IMPLANT
CLIP APPLIE ROT 10 11.4 M/L (STAPLE) ×1 IMPLANT
CLOSURE WOUND 1/2 X4 (GAUZE/BANDAGES/DRESSINGS) ×1
COVER MAYO STAND STRL (DRAPES) ×3 IMPLANT
DECANTER SPIKE VIAL GLASS SM (MISCELLANEOUS) ×3 IMPLANT
DRAPE C-ARM 42X120 X-RAY (DRAPES) ×3 IMPLANT
DRAPE LAPAROSCOPIC ABDOMINAL (DRAPES) ×3 IMPLANT
DRAPE UTILITY XL STRL (DRAPES) ×3 IMPLANT
DRSG TEGADERM 2-3/8X2-3/4 SM (GAUZE/BANDAGES/DRESSINGS) ×9 IMPLANT
DRSG TEGADERM 4X4.75 (GAUZE/BANDAGES/DRESSINGS) ×3 IMPLANT
ELECT REM PT RETURN 9FT ADLT (ELECTROSURGICAL) ×3
ELECTRODE REM PT RTRN 9FT ADLT (ELECTROSURGICAL) ×1 IMPLANT
FILTER SMOKE EVAC LAPAROSHD (FILTER) ×3 IMPLANT
GLOVE BIO SURGEON STRL SZ7 (GLOVE) ×3 IMPLANT
GLOVE BIOGEL PI IND STRL 7.5 (GLOVE) ×1 IMPLANT
GLOVE BIOGEL PI INDICATOR 7.5 (GLOVE) ×2
GOWN STRL REUS W/TWL LRG LVL3 (GOWN DISPOSABLE) ×3 IMPLANT
GOWN STRL REUS W/TWL XL LVL3 (GOWN DISPOSABLE) ×6 IMPLANT
KIT BASIN OR (CUSTOM PROCEDURE TRAY) ×3 IMPLANT
NS IRRIG 1000ML POUR BTL (IV SOLUTION) ×3 IMPLANT
POUCH SPECIMEN RETRIEVAL 10MM (ENDOMECHANICALS) ×3 IMPLANT
RINGERS IRRIG 1000ML POUR BTL (IV SOLUTION) ×3 IMPLANT
SET CHOLANGIOGRAPH MIX (MISCELLANEOUS) ×3 IMPLANT
SET IRRIG TUBING LAPAROSCOPIC (IRRIGATION / IRRIGATOR) ×3 IMPLANT
SOLUTION ANTI FOG 6CC (MISCELLANEOUS) ×3 IMPLANT
SPONGE GAUZE 4X4 12PLY (GAUZE/BANDAGES/DRESSINGS) ×2 IMPLANT
STRIP CLOSURE SKIN 1/2X4 (GAUZE/BANDAGES/DRESSINGS) ×2 IMPLANT
SUT MNCRL AB 4-0 PS2 18 (SUTURE) ×3 IMPLANT
SYR 20CC LL (SYRINGE) IMPLANT
TOWEL OR 17X26 10 PK STRL BLUE (TOWEL DISPOSABLE) ×3 IMPLANT
TOWEL OR NON WOVEN STRL DISP B (DISPOSABLE) ×3 IMPLANT
TRAY LAP CHOLE (CUSTOM PROCEDURE TRAY) ×3 IMPLANT
TROCAR BLADELESS OPT 5 75 (ENDOMECHANICALS) ×3 IMPLANT
TROCAR XCEL BLUNT TIP 100MML (ENDOMECHANICALS) ×3 IMPLANT
TROCAR XCEL NON-BLD 11X100MML (ENDOMECHANICALS) ×3 IMPLANT
TUBING INSUFFLATION 10FT LAP (TUBING) ×3 IMPLANT

## 2013-03-13 NOTE — Preoperative (Signed)
Beta Blockers   Reason not to administer Beta Blockers:Not Applicable 

## 2013-03-13 NOTE — Discharge Instructions (Signed)
[facility] °LAPAROSCOPIC SURGERY: POST OP INSTRUCTIONS °Always review your discharge instruction sheet given to you by the facility where your surgery was performed. °IF YOU HAVE DISABILITY OR FAMILY LEAVE FORMS, YOU MUST BRING THEM TO THE OFFICE FOR PROCESSING.   °DO NOT GIVE THEM TO YOUR DOCTOR. ° °1. A prescription for pain medication will be given to you upon discharge.  Take your pain medication as prescribed, if needed.  If narcotic pain medicine is not needed, then you may take acetaminophen (Tylenol) or ibuprofen (Advil) as needed. °2. Take your usually prescribed medications unless otherwise directed. °3. If you need a refill on your pain medication, please contact your pharmacy.  They will contact our office to request authorization. Prescriptions will not be filled after 5pm or on week-ends. °4. You should follow a light diet the first few days after arrival home, such as soup and crackers, etc.  Be sure to include lots of fluids daily. °5. Most patients will experience some swelling and bruising in the area of the incisions.  Ice packs will help.  Swelling and bruising can take several days to resolve.  °6. It is common to experience some constipation if taking pain medication after surgery.  Increasing fluid intake and taking a stool softener (such as Colace) will usually help or prevent this problem from occurring.  A mild laxative (Milk of Magnesia or Miralax) should be taken according to package instructions if there are no bowel movements after 48 hours. °7. Unless discharge instructions indicate otherwise, you may remove your bandages 48 hours after surgery, and you may shower at that time.  You will have steri-strips (small skin tapes) in place directly over the incision.  These strips should be left on the skin for 7-10 days.  If your surgeon used skin glue on the incision, you may shower in 24 hours.  The glue will flake off over the next 2-3 weeks.  Any sutures or staples  will be removed at the office during your follow-up visit. °8. ACTIVITIES:  You may resume regular (light) daily activities beginning the next day--such as daily self-care, walking, climbing stairs--gradually increasing activities as tolerated.  You may have sexual intercourse when it is comfortable.  Refrain from any heavy lifting or straining until approved by your doctor. °a. You may drive when you are no longer taking prescription pain medication, you can comfortably wear a seatbelt, and you can safely maneuver your car and apply brakes. °b. RETURN TO WORK:   2-3 weeks °9. You should see your doctor in the office for a follow-up appointment approximately 2-3 weeks after your surgery.  Make sure that you call for this appointment within a day or two after you arrive home to insure a convenient appointment time. °10. OTHER INSTRUCTIONS: ________________________________________________________________________ °WHEN TO CALL YOUR DOCTOR: °1. Fever over 101.0 °2. Inability to urinate °3. Continued bleeding from incision. °4. Increased pain, redness, or drainage from the incision. °5. Increasing abdominal pain ° °The clinic staff is available to answer your questions during regular business hours.  Please don’t hesitate to call and ask to speak to one of the nurses for clinical concerns.  If you have a medical emergency, go to the nearest emergency room or call 911.  A surgeon from [facility] is always on call at the hospital. °[address], [address], [place], [place]  [address] ? [address] [address], [place], [place]   [address] °[phone] ? [phone] ? FAX [phone] °Web site:   [email or web address] ° °

## 2013-03-13 NOTE — H&P (View-Only) (Signed)
Patient ID: Rhonda Henry, female   DOB: 1945/02/15, 68 y.o.   MRN: [id number]  Chief Complaint  Patient presents with  . Other    Eval biliary disease    HPI Rhonda Henry is a 68 y.o. female.  Referred by Dr. Erskine Emery for evaluation of chronic RUQ symptoms HPI This is a 68 yo female that presents with four years of intermittent attacks of severe RUQ abdominal pain, associated with abdominal bloating, nausea, and some bowel movements changes.  She had a CCK-HIDA scan in 2011 that showed an ejection fraction of 54%.  Recent ultrasound showed no stones.  CT scan was unremarkable.  Colonoscopy a few years ago was unremarkable.  Her symptoms have become more severe and more frequent.  She gets symptoms even with plain chicken soup.  She had one recent ED visit for severe pain.  The patient is hesitant to consider more surgery since she has had significant wound healing issues after facelift and eye surgery.  Past Medical History  Diagnosis Date  . Nontoxic multinodular goiter   . Other voice and resonance disorders   . Acute sinusitis, unspecified   . Esophageal reflux   . Goiter, unspecified   . Cramp of limb   . Lumbago   . Burn of unspecified degree of forearm   . Persistent disorder of initiating or maintaining sleep   . Personal history of unspecified urinary disorder   . Other chronic pain   . Unspecified essential hypertension   . Depressive disorder, not elsewhere classified   . Anxiety state, unspecified   . Antritis (stomach)   . Arthritis   . Menopause   . Paresthesia     R facial, s/p facelift    Past Surgical History  Procedure Laterality Date  . Facial cosmetic surgery  7-10    compl by thick scars and a scar necrosis on R cheek  . Breast biopsies    . Breast lumpectomy      x 2  . Refractive surgery  1997  . Lumbar epidural injection  Jan 15     Family History  Problem Relation Age of Onset  . Hypertension Other   . Thyroid cancer Mother    . Heart disease Mother   . Macular degeneration Mother   . Glaucoma Father   . Colon cancer Neg Hx     Social History History  Substance Use Topics  . Smoking status: Never Smoker   . Smokeless tobacco: Never Used  . Alcohol Use: Yes     Comment: socially    No Known Allergies  Current Outpatient Prescriptions  Medication Sig Dispense Refill  . diazepam (VALIUM) 5 MG tablet Take 1 tablet (5 mg total) by mouth every 12 (twelve) hours as needed for anxiety.  30 tablet  1  . diclofenac (VOLTAREN) 75 MG EC tablet Take 75 mg by mouth daily as needed for mild pain.      Marland Kitchen Hyoscyamine Sulfate 0.375 MG TBCR Take 1 tablet (0.375 mg total) by mouth daily.  30 each  1  . HYDROcodone-acetaminophen (NORCO/VICODIN) 5-325 MG per tablet Take 1-2 tablets by mouth every 6 (six) hours as needed for moderate pain or severe pain.  12 tablet  0  . ondansetron (ZOFRAN) 4 MG tablet Take 1 tablet (4 mg total) by mouth every 6 (six) hours.  12 tablet  0   Current Facility-Administered Medications  Medication Dose Route Frequency Provider Last Rate Last Dose  . levalbuterol (XOPENEX)  nebulizer solution 0.63 mg  0.63 mg Nebulization Once Kristi M Smith, MD        Review of Systems Review of Systems  Constitutional: Positive for chills. Negative for fever and unexpected weight change.  HENT: Negative for congestion, hearing loss, sore throat, trouble swallowing and voice change.   Eyes: Negative for visual disturbance.  Respiratory: Negative for cough and wheezing.   Cardiovascular: Positive for leg swelling. Negative for chest pain and palpitations.  Gastrointestinal: Positive for abdominal pain. Negative for nausea, vomiting, diarrhea, constipation, blood in stool, abdominal distention and anal bleeding.  Genitourinary: Negative for hematuria, vaginal bleeding and difficulty urinating.  Musculoskeletal: Positive for arthralgias.  Skin: Negative for rash and wound.  Neurological: Negative for  seizures, syncope and headaches.  Hematological: Negative for adenopathy. Does not bruise/bleed easily.  Psychiatric/Behavioral: Negative for confusion.    Blood pressure 138/82, pulse 74, resp. rate 16, height 5' 1" (1.549 m), weight 195 lb (88.451 kg).  Physical Exam Physical Exam WDWN in NAD HEENT:  EOMI, sclera anicteric Neck:  No masses, no thyromegaly Lungs:  CTA bilaterally; normal respiratory effort CV:  Regular rate and rhythm; no murmurs Abd:  +bowel sounds, soft, mildly distended; no significant tenderness; no palpable masses Ext:  Well-perfused; no edema Skin:  Warm, dry; no sign of jaundice  Data Reviewed Us Abdomen Complete  02/18/2013   CLINICAL DATA:  Right-sided abdominal pain  EXAM: ULTRASOUND ABDOMEN COMPLETE  COMPARISON:  [facility] CT 09/21/2012  FINDINGS: Gallbladder:  No gallstones or wall thickening visualized. No sonographic Murphy sign noted.  Common bile duct:  Diameter: Normal at 3.5 mm  Liver:  Rounded 1.5 anechoic lesion with through transmission in the left hepatic lobe corresponds to a small hemangioma on comparison CT. No intrahepatic biliary duct dilatation.  IVC:  No abnormality visualized.  Pancreas:  Visualized portion unremarkable.  Spleen:  Size and appearance within normal limits.  Right Kidney:  Length: 10.0 cm . Echogenicity within normal limits. No mass or hydronephrosis visualized.  Left Kidney:  Length: 9.7 cm. Echogenicity within normal limits. No mass or hydronephrosis visualized.  Abdominal aorta:  No aneurysm visualized.  Other findings:  No free fluid  IMPRESSION: 1. No acute abdominal findings. 2. Normal gallbladder. 3. Small hemangioma within the left hepatic lobe.   Electronically Signed   By: Stewart  Edmunds M.D.   On: 02/18/2013 19:09    Lab Results  Component Value Date   ALT 12 02/18/2013   AST 15 02/18/2013   ALKPHOS 71 02/18/2013   BILITOT 0.5 02/18/2013    Lab Results  Component Value Date   WBC 7.3 02/18/2013   HGB 13.0  02/18/2013   HCT 39.2 02/18/2013   MCV 84.5 02/18/2013   PLT 253 02/18/2013   Lab Results  Component Value Date   CREATININE 0.91 02/18/2013   BUN 16 02/18/2013   NA 142 02/18/2013   K 3.8 02/18/2013   CL 105 02/18/2013   CO2 27 02/18/2013     Assessment    Chronic intermittent RUQ abdominal pain - consistent with biliary colic.      Plan    I think the patient would benefit from elective cholecystectomy.  Her symptoms are classic for gallbladder disease.  Alternatively we could repeat her CCK HIDA scan, but because of the severe symptoms, I would recommend proceeding with surgery.    Laparoscopic cholecystectomy with intraoperative cholangiogram. The surgical procedure has been discussed with the patient.  Potential risks, benefits, alternative treatments, and expected   outcomes have been explained.  All of the patient's questions at this time have been answered.  The likelihood of reaching the patient's treatment goal is good.  The patient understand the proposed surgical procedure and wishes to proceed. She will think about it and will call back to schedule surgery.       Francia Verry K. 02/23/2013, 1:43 PM

## 2013-03-13 NOTE — Op Note (Signed)
Laparoscopic Cholecystectomy with IOC Procedure Note  Indications: This patient presents with symptomatic gallbladder disease and will undergo laparoscopic cholecystectomy.  Pre-operative Diagnosis: Chronic cholecystitis  Post-operative Diagnosis: Same  Surgeon: Ambur Province K.   Assistants: none  Anesthesia: General endotracheal anesthesia  ASA Class: 2  Procedure Details  The patient was seen again in the Holding Room. The risks, benefits, complications, treatment options, and expected outcomes were discussed with the patient. The possibilities of reaction to medication, pulmonary aspiration, perforation of viscus, bleeding, recurrent infection, finding a normal gallbladder, the need for additional procedures, failure to diagnose a condition, the possible need to convert to an open procedure, and creating a complication requiring transfusion or operation were discussed with the patient. The likelihood of improving the patient's symptoms with return to their baseline status is good.  The patient and/or family concurred with the proposed plan, giving informed consent. The site of surgery properly noted. The patient was taken to Operating Room, identified as [name] and the procedure verified as Laparoscopic Cholecystectomy with Intraoperative Cholangiogram. A Time Out was held and the above information confirmed.  Prior to the induction of general anesthesia, antibiotic prophylaxis was administered. General endotracheal anesthesia was then administered and tolerated well. After the induction, the abdomen was prepped with Chloraprep and draped in the sterile fashion. The patient was positioned in the supine position.  Local anesthetic agent was injected into the skin near the umbilicus and an incision made. We dissected down to the abdominal fascia with blunt dissection.  The fascia was incised vertically and we entered the peritoneal cavity bluntly.  A pursestring suture of 0-Vicryl  was placed around the fascial opening.  The Hasson cannula was inserted and secured with the stay suture.  Pneumoperitoneum was then created with CO2 and tolerated well without any adverse changes in the patient's vital signs. An 11-mm port was placed in the subxiphoid position.  Two 5-mm ports were placed in the right upper quadrant. All skin incisions were infiltrated with a local anesthetic agent before making the incision and placing the trocars.   We positioned the patient in reverse Trendelenburg, tilted slightly to the patient's left.  The gallbladder was identified, but there were significant omental adhesions to the gallbladder and the liver.  The fundus of the gallbladder was grasped and retracted cephalad. Adhesions were lysed bluntly and with the electrocautery where indicated, taking care not to injure any adjacent organs or viscus. The infundibulum was grasped and retracted laterally, exposing the peritoneum overlying the triangle of Calot. This was then divided and exposed in a blunt fashion. A critical view of the cystic duct and cystic artery was obtained.  The cystic duct was clearly identified and bluntly dissected circumferentially. The cystic duct was ligated with a clip distally.   An incision was made in the cystic duct and the [facility] cholangiogram catheter introduced. The catheter was secured using a clip. A cholangiogram was then obtained which showed good visualization of the distal and proximal biliary tree with no sign of filling defects or obstruction.  Contrast flowed easily into the duodenum. The catheter was then removed.   The cystic duct was then ligated with clips and divided. The cystic artery was identified, dissected free, ligated with clips and divided as well.   The gallbladder was dissected from the liver bed in retrograde fashion with the electrocautery. The gallbladder was removed and placed in an Endocatch sac. A small amount of bile was spilled but no stones were  seen. The liver  bed was irrigated and inspected. Hemostasis was achieved with the electrocautery. Copious irrigation was utilized and was repeatedly aspirated until clear.  The gallbladder and Endocatch sac were then removed through the umbilical port site.  The pursestring suture was used to close the umbilical fascia.    We again inspected the right upper quadrant for hemostasis.  Pneumoperitoneum was released as we removed the trocars.  4-0 Monocryl was used to close the skin.   Benzoin, steri-strips, and clean dressings were applied. The patient was then extubated and brought to the recovery room in stable condition. Instrument, sponge, and needle counts were correct at closure and at the conclusion of the case.   Findings: Cholecystitis without Cholelithiasis  Estimated Blood Loss: Minimal         Drains: none         Specimens: Gallbladder           Complications: None; patient tolerated the procedure well.         Disposition: PACU - hemodynamically stable.         Condition: stable   Rhonda Henry. Rhonda Dover, MD, [facility]  General/ Trauma Surgery  03/13/2013 8:38 AM

## 2013-03-13 NOTE — Progress Notes (Signed)
Patient ambulated in hallway after laparoscopic cholecystectomy. She tolerated it well.

## 2013-03-13 NOTE — Interval H&P Note (Signed)
History and Physical Interval Note:  03/13/2013 7:08 AM  Rhonda Henry  has presented today for surgery, with the diagnosis of chronic abdominal pain  The various methods of treatment have been discussed with the patient and family. After consideration of risks, benefits and other options for treatment, the patient has consented to  Procedure(s): LAPAROSCOPIC CHOLECYSTECTOMY WITH INTRAOPERATIVE CHOLANGIOGRAM (N/A) as a surgical intervention .  The patient's history has been reviewed, patient examined, no change in status, stable for surgery.  I have reviewed the patient's chart and labs.  Questions were answered to the patient's satisfaction.    She understands that none of her tests show significant gallbladder disease, but her symptoms seem to be classic for her GB.  At this point, she would like to proceed with cholecystectomy. Jarvin Ogren K.

## 2013-03-13 NOTE — Transfer of Care (Signed)
Immediate Anesthesia Transfer of Care Note  Patient: Rhonda Henry  Procedure(s) Performed: Procedure(s): LAPAROSCOPIC CHOLECYSTECTOMY WITH INTRAOPERATIVE CHOLANGIOGRAM (N/A)  Patient Location: PACU  Anesthesia Type:General  Level of Consciousness: awake, alert  and oriented  Airway & Oxygen Therapy: Patient Spontanous Breathing and Patient connected to face mask oxygen  Post-op Assessment: Report given to PACU RN and Post -op Vital signs reviewed and stable  Post vital signs: Reviewed and stable  Complications: No apparent anesthesia complications

## 2013-03-13 NOTE — Anesthesia Postprocedure Evaluation (Signed)
  Anesthesia Post-op Note  Patient: Rhonda Henry  Procedure(s) Performed: Procedure(s) (LRB): LAPAROSCOPIC CHOLECYSTECTOMY WITH INTRAOPERATIVE CHOLANGIOGRAM (N/A)  Patient Location: PACU  Anesthesia Type: General  Level of Consciousness: awake and alert   Airway and Oxygen Therapy: Patient Spontanous Breathing  Post-op Pain: mild  Post-op Assessment: Post-op Vital signs reviewed, Patient's Cardiovascular Status Stable, Respiratory Function Stable, Patent Airway and No signs of Nausea or vomiting  Last Vitals:  Filed Vitals:   03/13/13 1138  BP: 146/73  Pulse: 71  Temp: 36.1 C  Resp: 16    Post-op Vital Signs: stable   Complications: No apparent anesthesia complications

## 2013-03-13 NOTE — Anesthesia Preprocedure Evaluation (Addendum)
Anesthesia Evaluation  Patient identified by MRN, date of birth, ID band Patient awake    Reviewed: Allergy & Precautions, H&P , NPO status , Patient's Chart, lab work & pertinent test results  History of Anesthesia Complications Negative for: history of anesthetic complications  Airway Mallampati: II TM Distance: >3 FB Neck ROM: Full    Dental no notable dental hx.    Pulmonary neg pulmonary ROS,  breath sounds clear to auscultation  Pulmonary exam normal       Cardiovascular negative cardio ROS  Rhythm:Regular Rate:Normal     Neuro/Psych negative neurological ROS  negative psych ROS   GI/Hepatic negative GI ROS, Neg liver ROS,   Endo/Other  negative endocrine ROS  Renal/GU negative Renal ROS  negative genitourinary   Musculoskeletal negative musculoskeletal ROS (+)   Abdominal   Peds negative pediatric ROS (+)  Hematology negative hematology ROS (+)   Anesthesia Other Findings   Reproductive/Obstetrics negative OB ROS                          Anesthesia Physical Anesthesia Plan  ASA: II  Anesthesia Plan: General   Post-op Pain Management:    Induction: Intravenous  Airway Management Planned: Oral ETT  Additional Equipment:   Intra-op Plan:   Post-operative Plan: Extubation in OR  Informed Consent: I have reviewed the patients History and Physical, chart, labs and discussed the procedure including the risks, benefits and alternatives for the proposed anesthesia with the patient or authorized representative who has indicated his/her understanding and acceptance.   Dental advisory given  Plan Discussed with: CRNA  Anesthesia Plan Comments:         Anesthesia Quick Evaluation

## 2013-03-14 ENCOUNTER — Encounter (HOSPITAL_COMMUNITY): Payer: Self-pay | Admitting: Surgery

## 2013-03-16 ENCOUNTER — Telehealth (INDEPENDENT_AMBULATORY_CARE_PROVIDER_SITE_OTHER): Payer: Self-pay

## 2013-03-16 NOTE — Telephone Encounter (Signed)
Patient states she is constipated and she had took miralax without results Advised her to increase her fluids,warm apple juice or prune juice ,increase fiber prunes, apples, raisins etc increase activity as tolerated. Advised to try Milk of mag as directed on package . She should call if her condition becomes worse or further  Questions. Patient verbalizes understanding

## 2013-03-19 ENCOUNTER — Ambulatory Visit (INDEPENDENT_AMBULATORY_CARE_PROVIDER_SITE_OTHER): Payer: Medicare Other | Admitting: General Surgery

## 2013-03-19 ENCOUNTER — Encounter (INDEPENDENT_AMBULATORY_CARE_PROVIDER_SITE_OTHER): Payer: Self-pay | Admitting: General Surgery

## 2013-03-19 ENCOUNTER — Telehealth: Payer: Self-pay | Admitting: Gastroenterology

## 2013-03-19 VITALS — BP 142/82 | HR 80 | Temp 99.5°F | Resp 16 | Ht 61.0 in | Wt 193.4 lb

## 2013-03-19 DIAGNOSIS — T7840XA Allergy, unspecified, initial encounter: Secondary | ICD-10-CM

## 2013-03-19 MED ORDER — PREDNISONE 20 MG PO TABS: 40.0000 mg | ORAL_TABLET | Freq: Every day | ORAL | Status: AC

## 2013-03-19 NOTE — Telephone Encounter (Signed)
I only see a reference to a colonoscopy report in 2006 showing diverticulosis and internal hemorrhoids.  No biopsies were done.

## 2013-03-19 NOTE — Telephone Encounter (Signed)
Dr. Deatra Ina have you seen a path report on pt from her last colon with Dr. Earlean Shawl? Please advise.

## 2013-03-19 NOTE — Progress Notes (Signed)
History: Patient is approximately one week following elective laparoscopic cholecystectomy. For 5 days ago she began to develop a lot of itching underneath her bandages. She removed the tape and noted redness and welts to the skin underneath the tape. She used Benadryl cream and oral Benadryl for a couple of days and this did not help. She called the office 3 days ago and then started using a relative's 1% hydrocortisone cream. She states the redness has improved but she still has a lot of itching which is now generalized and does not respond to Benadryl. She states she has some trouble swallowing. No breathing difficulties. She has a metallic taste in her mouth. She is very concerned about the itching and generalized symptoms and feels strongly she needs something to manage this. The Benadryl is not helping.  Exam: BP 142/82  Pulse 80  Temp(Src) 99.5 F (37.5 C) (Temporal)  Resp 16  Ht 5\' 1"  (1.549 m)  Wt 193 lb 6.4 oz (87.726 kg)  BMI 36.56 kg/m2 General: Appears well Abdomen: Soft and nontender. There is a mild rash in the shape of the previous bandages and somewhat more of a rash underneath the Steri-Strips which I removed. No other generalized rash that I could detect. HEENT: Appears normal without unusual swelling  Assessment and plan: Apparent allergic reaction possibly to a medication use perioperatively and also some atopic dermatitis. I discussed with her that prednisone was a option that we had not explored but did have significant side effects such as effecting ability to heal wounds are fine infection. She however feels that her current symptoms are not tolerable and we elected to go ahead and give her 40 mg of prednisone daily for 5 days. She is to call for any worsening symptoms and she has an appointment for routine followup in about 2 weeks.

## 2013-03-19 NOTE — Telephone Encounter (Signed)
Left message for pt to call back  °

## 2013-03-21 ENCOUNTER — Ambulatory Visit: Payer: Medicare Other | Admitting: Gastroenterology

## 2013-03-21 NOTE — Telephone Encounter (Signed)
Left message for pt to call back  °

## 2013-03-22 NOTE — Telephone Encounter (Signed)
No need for office visit if she's feeling well. Followup colonoscopy in 2016

## 2013-03-22 NOTE — Telephone Encounter (Signed)
Pt aware. Pt states she had her gallbladder removed and she is feeling much better. Pt states she has an OV scheduled with Dr. Deatra Ina but wanted to know if she needs to keep that appt. Pt would like to know if Dr. Deatra Ina wants to do a colon on her sooner than her recall date since she had diverticulitis. Please advise.

## 2013-03-23 NOTE — Telephone Encounter (Signed)
Pt aware.

## 2013-03-30 ENCOUNTER — Ambulatory Visit (INDEPENDENT_AMBULATORY_CARE_PROVIDER_SITE_OTHER): Payer: Medicare Other | Admitting: Surgery

## 2013-03-30 ENCOUNTER — Encounter (INDEPENDENT_AMBULATORY_CARE_PROVIDER_SITE_OTHER): Payer: Self-pay | Admitting: Surgery

## 2013-03-30 VITALS — BP 144/82 | HR 80 | Temp 98.7°F | Resp 16 | Ht 61.0 in | Wt 195.0 lb

## 2013-03-30 DIAGNOSIS — K811 Chronic cholecystitis: Secondary | ICD-10-CM | POA: Insufficient documentation

## 2013-03-30 NOTE — Telephone Encounter (Signed)
Error

## 2013-03-30 NOTE — Progress Notes (Signed)
Status post left cholecystectomy with intraoperative cholangiogram 03/13/13 for chronic cholecystitis. The patient was seen last week with severe skin irritation. This responded to prednisone. Overall she is feeling well. She is a little bit of tenderness at her right costal margin. Her incisions are all well-healed with no sign of infection. The dermatitis has resolved. Her area of tenderness is at the cholangiogram site. It is possible that we irritated the costal margin with the cholangiogram catheter. This should resolve with time.  She may resume full activity and regular diet. Followup when necessary.  Rhonda Henry. [name], MD, [facility]  General/ Trauma Surgery  03/30/2013 12:04 PM

## 2013-04-04 ENCOUNTER — Ambulatory Visit: Payer: Medicare Other | Admitting: Gastroenterology

## 2013-04-04 ENCOUNTER — Telehealth: Payer: Self-pay | Admitting: Gastroenterology

## 2013-04-04 NOTE — Telephone Encounter (Signed)
No charge. 

## 2013-04-27 ENCOUNTER — Telehealth (INDEPENDENT_AMBULATORY_CARE_PROVIDER_SITE_OTHER): Payer: Self-pay | Admitting: General Surgery

## 2013-04-27 NOTE — Telephone Encounter (Signed)
Patient called in explaining that she had lap chole on 03/13/13 and has been fine since her surgery date until this past Saturday 04/21/13.  She started having diarrhea, URQ pain, and pain at the incision site to the point where it hurts to lay on that side.  She denies any fever, chills, N/V, or bulging at the incision site.  I informed the patient that she is pretty far out from the surgery to where it would be a postop complication but that I would send this message to Dr. [name] and see if he has any suggestions or advice.  Informed the patient to wait it out a couple more days to make sure it isn't a virus and that she should increase fluid and fiber intake. The patient verbalized understanding.

## 2013-04-30 ENCOUNTER — Telehealth: Payer: Self-pay | Admitting: Gastroenterology

## 2013-04-30 NOTE — Telephone Encounter (Signed)
Pt states she is having a burning pain in her right side and she has been nauseated. States she did do a lot of work in the yard and states she called [facility] and was told it could have been a virus. Offered pt an appt with [facility] but she only wants to see Dr. Deatra Ina. Pt states she will call [facility] back, states she may call back for an appt later.

## 2013-05-01 NOTE — Telephone Encounter (Signed)
Pt calling back again b/c she is not any better from her last call into the office. The pt is feeling worse not better. The pt c/o RUQ pain,nausea,loose stools,and some chills. The pt states she is not running a fever but just doesn't feel like she should after having her gallbladder removed. The pt states that she is feeling the same way when she did have her gallbladder still. The pt states that she is really not eating that much b/c not feeling well. I advised pt that I would send this message to Dr [name] and his assistant to get his advice on the problem. The pt is not scheduled for a f/u appt again. Please advise.

## 2013-05-01 NOTE — Telephone Encounter (Signed)
Called patient made apt for her know that I wanted her to see Dr [name] to make sure she is doing okay, she stated that she is having N/V and burning on the right side, she was out on Saturday and moving a lot of things around her house and yard and she thought that she did something, a pull muscle. But she stated that she had N/V and burning, no fever, BM are okay. She is coming in this Friday at 10:30 am to see Dr [name]

## 2013-05-01 NOTE — Telephone Encounter (Signed)
Please get a CBC, CMET, and Lipase before Friday.

## 2013-05-02 ENCOUNTER — Telehealth (INDEPENDENT_AMBULATORY_CARE_PROVIDER_SITE_OTHER): Payer: Self-pay | Admitting: General Surgery

## 2013-05-02 ENCOUNTER — Other Ambulatory Visit (INDEPENDENT_AMBULATORY_CARE_PROVIDER_SITE_OTHER): Payer: Self-pay | Admitting: Surgery

## 2013-05-02 DIAGNOSIS — R1011 Right upper quadrant pain: Secondary | ICD-10-CM | POA: Diagnosis not present

## 2013-05-02 DIAGNOSIS — R112 Nausea with vomiting, unspecified: Secondary | ICD-10-CM

## 2013-05-02 LAB — CBC
HCT: 39.7 % (ref 36.0–46.0)
HEMOGLOBIN: 13.4 g/dL (ref 12.0–15.0)
MCH: 26.9 pg (ref 26.0–34.0)
MCHC: 33.8 g/dL (ref 30.0–36.0)
MCV: 79.7 fL (ref 78.0–100.0)
Platelets: 286 10*3/uL (ref 150–400)
RBC: 4.98 MIL/uL (ref 3.87–5.11)
RDW: 13.8 % (ref 11.5–15.5)
WBC: 7.7 10*3/uL (ref 4.0–10.5)

## 2013-05-02 LAB — COMPREHENSIVE METABOLIC PANEL
ALT: 11 U/L (ref 0–35)
AST: 16 U/L (ref 0–37)
Albumin: 4 g/dL (ref 3.5–5.2)
Alkaline Phosphatase: 76 U/L (ref 39–117)
BUN: 16 mg/dL (ref 6–23)
CO2: 30 mEq/L (ref 19–32)
Calcium: 9.4 mg/dL (ref 8.4–10.5)
Chloride: 106 mEq/L (ref 96–112)
Creat: 0.65 mg/dL (ref 0.50–1.10)
GLUCOSE: 92 mg/dL (ref 70–99)
POTASSIUM: 4.5 meq/L (ref 3.5–5.3)
Sodium: 141 mEq/L (ref 135–145)
Total Bilirubin: 0.7 mg/dL (ref 0.2–1.2)
Total Protein: 6.1 g/dL (ref 6.0–8.3)

## 2013-05-02 LAB — LIPASE

## 2013-05-02 NOTE — Telephone Encounter (Signed)
Called patient and she is going to today to get lab work done at the [facility]

## 2013-05-04 ENCOUNTER — Encounter (INDEPENDENT_AMBULATORY_CARE_PROVIDER_SITE_OTHER): Payer: Self-pay | Admitting: Surgery

## 2013-05-04 ENCOUNTER — Ambulatory Visit (INDEPENDENT_AMBULATORY_CARE_PROVIDER_SITE_OTHER): Payer: Medicare Other | Admitting: Surgery

## 2013-05-04 VITALS — BP 150/90 | HR 78 | Temp 97.7°F | Resp 16 | Wt 196.0 lb

## 2013-05-04 DIAGNOSIS — R1011 Right upper quadrant pain: Secondary | ICD-10-CM

## 2013-05-04 MED ORDER — ORPHENADRINE CITRATE ER 100 MG PO TB12: 100.0000 mg | ORAL_TABLET | Freq: Two times a day (BID) | ORAL | Status: DC

## 2013-05-04 NOTE — Progress Notes (Signed)
Status post laparoscopic cholecystectomy with cholangiogram on 03/13/13 for chronic acalculous cholecystitis. The patient had been doing quite well at her postoperative visit. 2 weeks ago, the patient was cleaning out the basement and had to crawl underneath the steps to pull out some old flowerpots. The next day she started having some pain in her right costal margin with no swelling. She also developed some diarrhea. The diarrhea has resolved since taking some of her Levsin. She continues to have some soreness on her right side just below her costal margin. We obtained some blood tests which showed normal liver function tests, normal white count, and normal lipase. She presents now for reevaluation.  Filed Vitals:   05/04/13 1045  BP: 150/90  Pulse: 78  Temp: 97.7 F (36.5 C)  Resp: 16    Lab Results  Component Value Date   WBC 7.7 05/02/2013   HGB 13.4 05/02/2013   HCT 39.7 05/02/2013   MCV 79.7 05/02/2013   PLT 286 05/02/2013   Lab Results  Component Value Date   CREATININE 0.65 05/02/2013   BUN 16 05/02/2013   NA 141 05/02/2013   K 4.5 05/02/2013   CL 106 05/02/2013   CO2 30 05/02/2013   Lab Results  Component Value Date   ALT 11 05/02/2013   AST 16 05/02/2013   ALKPHOS 76 05/02/2013   BILITOT 0.7 05/02/2013   Lab Results  Component Value Date   LIPASE <10 05/02/2013    Her incisions are all well-healed with no sign of infection. No palpable masses. She is tender just below the edge of her right costal margin with some mild abdominal wall soreness around her right upper quadrant incisions. No palpable masses in this area.  Impression: This likely represents muscle strain with some residual musculoskeletal symptoms. Recommend using a mild muscle relaxant as well as ibuprofen. She should also put moist heat over this area. We'll reevaluate her in 2 weeks. Within normal blood work and normal physical examination, the likelihood of any complications from her surgery seems remote.  [name]. [name], MD, [facility]  General/ Trauma Surgery  05/04/2013 12:06 PM

## 2013-05-07 ENCOUNTER — Other Ambulatory Visit (INDEPENDENT_AMBULATORY_CARE_PROVIDER_SITE_OTHER): Payer: Self-pay | Admitting: Surgery

## 2013-05-07 MED ORDER — METHOCARBAMOL 500 MG PO TABS: 500.0000 mg | ORAL_TABLET | Freq: Four times a day (QID) | ORAL | Status: DC

## 2013-05-09 ENCOUNTER — Ambulatory Visit: Payer: Medicare Other

## 2013-05-10 DIAGNOSIS — H04129 Dry eye syndrome of unspecified lacrimal gland: Secondary | ICD-10-CM | POA: Diagnosis not present

## 2013-05-10 DIAGNOSIS — H269 Unspecified cataract: Secondary | ICD-10-CM | POA: Diagnosis not present

## 2013-05-18 ENCOUNTER — Ambulatory Visit (INDEPENDENT_AMBULATORY_CARE_PROVIDER_SITE_OTHER): Payer: Medicare Other | Admitting: Gastroenterology

## 2013-05-18 ENCOUNTER — Encounter: Payer: Self-pay | Admitting: Gastroenterology

## 2013-05-18 VITALS — BP 134/80 | HR 84 | Ht 61.0 in | Wt 193.3 lb

## 2013-05-18 DIAGNOSIS — Z1211 Encounter for screening for malignant neoplasm of colon: Secondary | ICD-10-CM | POA: Diagnosis not present

## 2013-05-18 DIAGNOSIS — R1011 Right upper quadrant pain: Secondary | ICD-10-CM | POA: Diagnosis not present

## 2013-05-18 NOTE — Progress Notes (Signed)
          History of Present Illness:  The patient has returned following cholecystectomy were chronic cholecystitis was seen.  Upper GI complaints have entirely resolved.  She has occasional right upper quadrant pain which is brought on by bending or twisting.    Review of Systems: Pertinent positive and negative review of systems were noted in the above HPI section. All other review of systems were otherwise negative.    Current Medications, Allergies, Past Medical History, Past Surgical History, Family History and Social History were reviewed in [place] [facility] record  Vital signs were reviewed in today's medical record. Physical Exam: General: Well developed , well nourished, no acute distress   See Assessment and Plan under Problem List

## 2013-05-18 NOTE — Assessment & Plan Note (Signed)
Plan follow-up colonoscopy 2016 

## 2013-05-18 NOTE — Assessment & Plan Note (Signed)
Asymptomatic following cholecystectomy

## 2013-05-18 NOTE — Patient Instructions (Signed)
Follow up as needed Recall for Colonoscopy is 2016

## 2013-05-21 ENCOUNTER — Other Ambulatory Visit: Payer: Self-pay | Admitting: *Deleted

## 2013-05-21 ENCOUNTER — Telehealth: Payer: Self-pay | Admitting: *Deleted

## 2013-05-21 MED ORDER — DIAZEPAM 5 MG PO TABS: 5.0000 mg | ORAL_TABLET | Freq: Two times a day (BID) | ORAL | Status: DC | PRN

## 2013-05-21 NOTE — Telephone Encounter (Signed)
Rhonda Henry would like a refill on Valium called into [facility] on [address]

## 2013-05-21 NOTE — Telephone Encounter (Signed)
Refill request

## 2013-05-21 NOTE — Telephone Encounter (Signed)
Refill request will call  In pending approval

## 2013-05-22 ENCOUNTER — Other Ambulatory Visit: Payer: Self-pay | Admitting: *Deleted

## 2013-05-22 NOTE — Telephone Encounter (Signed)
Called in valium to [facility]

## 2013-05-24 ENCOUNTER — Ambulatory Visit
Admission: RE | Admit: 2013-05-24 | Discharge: 2013-05-24 | Disposition: A | Discharge status: Home / Self Care | Payer: Medicare Other | Source: Ambulatory Visit

## 2013-05-24 DIAGNOSIS — Z1231 Encounter for screening mammogram for malignant neoplasm of breast: Secondary | ICD-10-CM

## 2013-05-27 ENCOUNTER — Encounter: Payer: Self-pay | Admitting: Internal Medicine

## 2013-05-29 ENCOUNTER — Encounter (INDEPENDENT_AMBULATORY_CARE_PROVIDER_SITE_OTHER): Payer: Medicare Other | Admitting: Surgery

## 2013-08-14 DIAGNOSIS — G894 Chronic pain syndrome: Secondary | ICD-10-CM | POA: Diagnosis not present

## 2013-08-14 DIAGNOSIS — M545 Low back pain, unspecified: Secondary | ICD-10-CM | POA: Diagnosis not present

## 2013-08-14 DIAGNOSIS — M48061 Spinal stenosis, lumbar region without neurogenic claudication: Secondary | ICD-10-CM | POA: Diagnosis not present

## 2013-08-14 DIAGNOSIS — M546 Pain in thoracic spine: Secondary | ICD-10-CM | POA: Diagnosis not present

## 2013-08-23 DIAGNOSIS — L821 Other seborrheic keratosis: Secondary | ICD-10-CM | POA: Diagnosis not present

## 2013-08-23 DIAGNOSIS — D239 Other benign neoplasm of skin, unspecified: Secondary | ICD-10-CM | POA: Diagnosis not present

## 2013-08-23 DIAGNOSIS — D485 Neoplasm of uncertain behavior of skin: Secondary | ICD-10-CM | POA: Diagnosis not present

## 2013-08-23 DIAGNOSIS — D1801 Hemangioma of skin and subcutaneous tissue: Secondary | ICD-10-CM | POA: Diagnosis not present

## 2013-08-23 DIAGNOSIS — L819 Disorder of pigmentation, unspecified: Secondary | ICD-10-CM | POA: Diagnosis not present

## 2013-08-24 DIAGNOSIS — D1801 Hemangioma of skin and subcutaneous tissue: Secondary | ICD-10-CM | POA: Diagnosis not present

## 2013-08-27 DIAGNOSIS — H1045 Other chronic allergic conjunctivitis: Secondary | ICD-10-CM | POA: Diagnosis not present

## 2013-08-29 ENCOUNTER — Encounter: Payer: Self-pay | Admitting: Internal Medicine

## 2013-08-29 ENCOUNTER — Ambulatory Visit (INDEPENDENT_AMBULATORY_CARE_PROVIDER_SITE_OTHER): Payer: Medicare Other | Admitting: Internal Medicine

## 2013-08-29 VITALS — BP 148/70 | HR 70 | Resp 16 | Ht 61.0 in | Wt 200.0 lb

## 2013-08-29 DIAGNOSIS — E669 Obesity, unspecified: Secondary | ICD-10-CM | POA: Diagnosis not present

## 2013-08-29 DIAGNOSIS — R609 Edema, unspecified: Secondary | ICD-10-CM

## 2013-08-29 DIAGNOSIS — I10 Essential (primary) hypertension: Secondary | ICD-10-CM

## 2013-08-29 DIAGNOSIS — L0889 Other specified local infections of the skin and subcutaneous tissue: Secondary | ICD-10-CM | POA: Diagnosis not present
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
## 2013-08-30 ENCOUNTER — Telehealth: Payer: Self-pay | Admitting: Internal Medicine

## 2013-08-30 MED ORDER — CIPROFLOXACIN HCL 500 MG PO TABS: ORAL_TABLET | ORAL | Status: DC

## 2013-08-30 MED ORDER — TRIAMTERENE-HCTZ 37.5-25 MG PO TABS: ORAL_TABLET | ORAL | Status: DC

## 2013-08-30 NOTE — Telephone Encounter (Signed)
Spoke with pt.  She is not taking potassium.  Using voltaren gel occasionally   Advised to start Triamterene/HCtZ 3 times per week and can have Cipro for travel to take one tablet daily for 5 days only if she developes diarrhea

## 2013-09-02 ENCOUNTER — Encounter: Payer: Self-pay | Admitting: Internal Medicine

## 2013-09-02 DIAGNOSIS — E669 Obesity, unspecified: Secondary | ICD-10-CM | POA: Insufficient documentation

## 2013-09-02 NOTE — Patient Instructions (Signed)
See me after your return from vacation  Ok to take diuretic qod while out of town

## 2013-09-24 ENCOUNTER — Telehealth: Payer: Self-pay

## 2013-09-24 NOTE — Telephone Encounter (Signed)
I spoke with Rhonda Henry and recommended that she see someone today-I recommended an urgent care. She is refusing to go to urgent care. After talking more to her about her breathing problems I again recommended that she be seen somewhere today, either urgent care or ED. -eh

## 2013-09-24 NOTE — Telephone Encounter (Signed)
Rhonda Henry [phone]  [name] called to say new diuretic is not helping to lower her blood pressure, she had called last week and I made her an appointment for 10/09/13, but she fells she cannot wait until then. She was out of town last week and woke up a couple of times with a headache, when walking she would get out of breath, just not herself.

## 2013-10-09 ENCOUNTER — Ambulatory Visit (INDEPENDENT_AMBULATORY_CARE_PROVIDER_SITE_OTHER): Payer: Medicare Other | Admitting: Internal Medicine

## 2013-10-09 ENCOUNTER — Encounter: Payer: Self-pay | Admitting: Internal Medicine

## 2013-10-09 VITALS — BP 146/84 | HR 69 | Temp 98.5°F | Resp 16 | Ht 61.0 in | Wt 199.0 lb

## 2013-10-09 DIAGNOSIS — R252 Cramp and spasm: Secondary | ICD-10-CM

## 2013-10-09 DIAGNOSIS — R0789 Other chest pain: Secondary | ICD-10-CM | POA: Diagnosis not present

## 2013-10-09 DIAGNOSIS — I10 Essential (primary) hypertension: Secondary | ICD-10-CM | POA: Diagnosis not present

## 2013-10-09 LAB — COMPLETE METABOLIC PANEL WITH GFR
ALT: 10 U/L (ref 0–35)
AST: 15 U/L (ref 0–37)
Albumin: 3.7 g/dL (ref 3.5–5.2)
Alkaline Phosphatase: 72 U/L (ref 39–117)
BILIRUBIN TOTAL: 0.7 mg/dL (ref 0.2–1.2)
BUN: 20 mg/dL (ref 6–23)
CHLORIDE: 106 meq/L (ref 96–112)
CO2: 28 meq/L (ref 19–32)
CREATININE: 0.72 mg/dL (ref 0.50–1.10)
Calcium: 8.8 mg/dL (ref 8.4–10.5)
GFR, EST NON AFRICAN AMERICAN: 86 mL/min
GLUCOSE: 85 mg/dL (ref 70–99)
Potassium: 4.1 mEq/L (ref 3.5–5.3)
Sodium: 140 mEq/L (ref 135–145)
Total Protein: 6 g/dL (ref 6.0–8.3)

## 2013-10-09 MED ORDER — LOSARTAN POTASSIUM 50 MG PO TABS: 50.0000 mg | ORAL_TABLET | Freq: Every day | ORAL | Status: DC

## 2013-10-09 MED ORDER — DIAZEPAM 5 MG PO TABS: 5.0000 mg | ORAL_TABLET | Freq: Two times a day (BID) | ORAL | Status: DC | PRN

## 2013-10-09 NOTE — Patient Instructions (Signed)
Give 30 min follow up in 4-5 weeks

## 2013-10-09 NOTE — Progress Notes (Signed)
   Subjective:    Patient ID: Rhonda Henry, female    DOB: 1946/01/02, 68 y.o.   MRN: [id number]  HPI Last OV Elevated BP: Given that pt has some LE edema Will start on Maxzide qod since she is going out of country for several weeks and I will not be able to see her . If she has increased swelling while travelling she can take daily. She is to see me upon her return and will get chemistries then.  Bilateral LE edema see above there may be a component of venous insufficiency .  Incomplete RBBB on EKG  Weight gain/obesity: Counseled that stimulatnst contra-indicated with uncontrolled HTN, May be a candidate for newer drugs at some point. Will refer to [facility] nutrition center.  OK to give RX for Cipro One tablet for 5 days at onset of diarrhea  Pt advised to see me upon her return she voices understanding           Today   Pt stopped Maxide two weeks ago due to cramping and achy all over    She had a Zumba class two weeks ago while still on diuretic and felt palpitations no chest pain or pressure   No SOB.  She did trip and fall but feet got crossed  - had bruising on buttock but all resolved now .  She does not want xray imaging.    All areas healed   No headache or dizziness    Review of Systems    see HPI Objective:   Physical Exam  Physical Exam  Nursing note and vitals reviewed.   See repeat BP Constitutional: She is oriented to person, place, and time. She appears well-developed and well-nourished.  HENT:  Head: Normocephalic and atraumatic.  Cardiovascular: Normal rate and regular rhythm. Exam reveals no gallop and no friction rub.  No murmur heard.  Pulmonary/Chest: Breath sounds normal. She has no wheezes. She has no rales.  Neurological: She is alert and oriented to person, place, and time.  Skin: Skin is warm and dry.  Psychiatric: She has a normal mood and affect. Her behavior is normal.             Assessment & Plan:  Palpitations   May have been due  to dehydration combined with exercise   EKG today no change  Compared ot 2010   HTN  Will give Cozaar 50 mg daily  See me in 4-5 weeks  Leg cramping  Will check K today

## 2013-10-24 ENCOUNTER — Encounter: Payer: Medicare Other | Admitting: Cardiovascular Disease

## 2013-10-29 ENCOUNTER — Other Ambulatory Visit: Payer: Self-pay | Admitting: Physical Medicine and Rehabilitation

## 2013-10-29 DIAGNOSIS — M545 Low back pain: Secondary | ICD-10-CM | POA: Diagnosis not present

## 2013-10-29 DIAGNOSIS — G894 Chronic pain syndrome: Secondary | ICD-10-CM | POA: Diagnosis not present

## 2013-10-29 DIAGNOSIS — M5442 Lumbago with sciatica, left side: Principal | ICD-10-CM

## 2013-10-29 DIAGNOSIS — M5117 Intervertebral disc disorders with radiculopathy, lumbosacral region: Secondary | ICD-10-CM | POA: Diagnosis not present

## 2013-10-29 DIAGNOSIS — M4806 Spinal stenosis, lumbar region: Secondary | ICD-10-CM | POA: Diagnosis not present

## 2013-10-29 DIAGNOSIS — M5441 Lumbago with sciatica, right side: Secondary | ICD-10-CM

## 2013-11-02 ENCOUNTER — Other Ambulatory Visit: Payer: Medicare Other

## 2013-11-03 ENCOUNTER — Other Ambulatory Visit: Payer: Medicare Other

## 2013-11-05 ENCOUNTER — Encounter: Payer: Self-pay | Admitting: Internal Medicine

## 2013-11-06 ENCOUNTER — Ambulatory Visit: Payer: Medicare Other | Admitting: Internal Medicine

## 2013-11-06 DIAGNOSIS — Z09 Encounter for follow-up examination after completed treatment for conditions other than malignant neoplasm: Secondary | ICD-10-CM

## 2013-11-10 ENCOUNTER — Ambulatory Visit
Admission: RE | Admit: 2013-11-10 | Discharge: 2013-11-10 | Disposition: A | Discharge status: Home / Self Care | Payer: Medicare Other | Source: Ambulatory Visit | Attending: Physical Medicine and Rehabilitation | Admitting: Physical Medicine and Rehabilitation

## 2013-11-10 DIAGNOSIS — M5442 Lumbago with sciatica, left side: Principal | ICD-10-CM

## 2013-11-10 DIAGNOSIS — M47817 Spondylosis without myelopathy or radiculopathy, lumbosacral region: Secondary | ICD-10-CM | POA: Diagnosis not present

## 2013-11-10 DIAGNOSIS — M4806 Spinal stenosis, lumbar region: Secondary | ICD-10-CM | POA: Diagnosis not present

## 2013-11-10 DIAGNOSIS — M5136 Other intervertebral disc degeneration, lumbar region: Secondary | ICD-10-CM | POA: Diagnosis not present

## 2013-11-10 DIAGNOSIS — M5441 Lumbago with sciatica, right side: Secondary | ICD-10-CM

## 2013-11-19 ENCOUNTER — Telehealth: Payer: Self-pay

## 2013-11-19 NOTE — Telephone Encounter (Signed)
She has seen Dr. Erling Cruz at [facility] .  All she has to do is call and make an appt .  With the first available appoitnment they have

## 2013-11-19 NOTE — Telephone Encounter (Signed)
Left message for Evelise-eh

## 2013-11-19 NOTE — Telephone Encounter (Signed)
Rhonda Henry [phone]  [name] had minor dental about weeks ago and it seems like every since then she has had acute pain in left jaw and cheek area, it is numb on left side of tounge. The only helps thing that helps some is Valium 1/2 pill every 6 to 8 hours. She would like a referral to a Neurologist. She has been back to dentist, she went in for cleaning and then had small cavity filled, she has been in pain every since injection, had a re route canal nothing has helped, she is in constant pain.

## 2013-11-19 NOTE — Telephone Encounter (Signed)
Would you need to see Temari? Please see Wanda's note-eh

## 2013-11-21 DIAGNOSIS — M4806 Spinal stenosis, lumbar region: Secondary | ICD-10-CM | POA: Diagnosis not present

## 2013-11-21 DIAGNOSIS — G894 Chronic pain syndrome: Secondary | ICD-10-CM | POA: Diagnosis not present

## 2013-11-21 DIAGNOSIS — M5117 Intervertebral disc disorders with radiculopathy, lumbosacral region: Secondary | ICD-10-CM | POA: Diagnosis not present

## 2013-11-21 DIAGNOSIS — M545 Low back pain: Secondary | ICD-10-CM | POA: Diagnosis not present

## 2013-11-21 DIAGNOSIS — M4727 Other spondylosis with radiculopathy, lumbosacral region: Secondary | ICD-10-CM | POA: Diagnosis not present

## 2013-11-23 ENCOUNTER — Encounter: Payer: Self-pay | Admitting: Internal Medicine

## 2013-11-23 ENCOUNTER — Other Ambulatory Visit: Payer: Self-pay | Admitting: Geriatric Medicine

## 2013-11-23 ENCOUNTER — Ambulatory Visit (INDEPENDENT_AMBULATORY_CARE_PROVIDER_SITE_OTHER): Payer: Medicare Other | Admitting: Geriatric Medicine

## 2013-11-23 ENCOUNTER — Ambulatory Visit (INDEPENDENT_AMBULATORY_CARE_PROVIDER_SITE_OTHER): Payer: Medicare Other | Admitting: Internal Medicine

## 2013-11-23 VITALS — BP 132/80 | HR 83 | Temp 98.2°F | Resp 18 | Ht 61.0 in | Wt 197.8 lb

## 2013-11-23 DIAGNOSIS — E669 Obesity, unspecified: Secondary | ICD-10-CM | POA: Diagnosis not present

## 2013-11-23 DIAGNOSIS — I10 Essential (primary) hypertension: Secondary | ICD-10-CM | POA: Diagnosis not present

## 2013-11-23 DIAGNOSIS — Z23 Encounter for immunization: Secondary | ICD-10-CM | POA: Diagnosis not present

## 2013-11-23 DIAGNOSIS — K089 Disorder of teeth and supporting structures, unspecified: Secondary | ICD-10-CM

## 2013-11-23 MED ORDER — LOSARTAN POTASSIUM 50 MG PO TABS: 50.0000 mg | ORAL_TABLET | Freq: Every day | ORAL | Status: DC

## 2013-11-23 NOTE — Progress Notes (Signed)
Pre visit review using our clinic review tool, if applicable. No additional management support is needed unless otherwise documented below in the visit note. 

## 2013-11-23 NOTE — Patient Instructions (Signed)
We will see back in about 6 months to check on your blood pressure. We do not need to take any blood work today.  I would recommend icing your mouth about 3-4 times per day if you're able. When you ice your mouth keep the ice pack on for about 15 minutes then take it off.  Please feel free to call our office with any problems or questions before your next visit.

## 2013-11-25 DIAGNOSIS — K089 Disorder of teeth and supporting structures, unspecified: Secondary | ICD-10-CM | POA: Insufficient documentation

## 2013-11-25 NOTE — Progress Notes (Signed)
   Subjective:    Patient ID: Rhonda Henry, female    DOB: 1945/08/06, 68 y.o.   MRN: [id number]  HPI The patient is a 68 year old woman who comes in today to establish care. She does have past medical history of hypertension, goiter, obesity, recent dental problems. She states that she recently found a new dentist who did a cavity for her. She states after that she was having a lot of pain. Then the dentist did a root canal on her. She states that she started had a root canal there and was having no pain no fevers however her dentist told her she had an abscess. She is unclear if she believes this. She did undergo antibiotics after the surgery as well as anti-inflammatories. She is still having a lot of pain in that region. At this point she has spent a lot of money and feels dissatisfied with her care. She denies any new medical problems including chest pain, shortness of breath, abdominal pain. She has done well with her cholecystectomy and has having no recurrent problems with that.  Review of Systems  Constitutional: Negative for fever, activity change, appetite change, fatigue and unexpected weight change.  HENT: Positive for dental problem. Negative for drooling.   Eyes: Negative.   Respiratory: Negative for cough, chest tightness, shortness of breath and wheezing.   Cardiovascular: Negative for chest pain, palpitations and leg swelling.  Gastrointestinal: Negative for abdominal pain, diarrhea, constipation and abdominal distention.  Musculoskeletal: Negative.   Skin: Negative.   Neurological: Negative.       Objective:   Physical Exam  Constitutional: She is oriented to person, place, and time. She appears well-developed and well-nourished.  HENT:  Head: Normocephalic and atraumatic.  Left upper molar with recent dental surgery. No signs of abscess or inflammation or swelling of the gums.  Eyes: EOM are normal.  Neck: Normal range of motion.  Cardiovascular: Normal rate and  regular rhythm.   Pulmonary/Chest: Effort normal and breath sounds normal. She has no wheezes.  Abdominal: Bowel sounds are normal. She exhibits no distension. There is no tenderness. There is no rebound.  Neurological: She is alert and oriented to person, place, and time. Coordination normal.  Skin: Skin is warm and dry.   Filed Vitals:   11/23/13 0952  BP: 132/80  Pulse: 83  Temp: 98.2 F (36.8 C)  TempSrc: Oral  Resp: 18  Height: 5\' 1"  (1.549 m)  Weight: 197 lb 12.8 oz (89.721 kg)  SpO2: 96%      Assessment & Plan:

## 2013-11-25 NOTE — Assessment & Plan Note (Signed)
Patient well controlled on losartan 50 mg.

## 2013-11-25 NOTE — Assessment & Plan Note (Signed)
She is currently unable to increase her diet or exercise due to her dental problems. After those resolve she will work on weight loss.

## 2013-11-25 NOTE — Assessment & Plan Note (Signed)
Advised that she should try icing the area of the root canal to see if this helps with the pain and inflammation. She can continue taking anti-inflammatories. Would recommend second opinion if she can afford the consultation.

## 2013-12-03 ENCOUNTER — Telehealth: Payer: Self-pay | Admitting: Internal Medicine

## 2013-12-03 DIAGNOSIS — M792 Neuralgia and neuritis, unspecified: Secondary | ICD-10-CM

## 2013-12-03 NOTE — Telephone Encounter (Signed)
Patient states she spoke with Dr. Doug Sou about after effects of nerve damage done by a dentist.  She is requesting a referral to a neurologist ASAP b/c she is in a lot of pain.

## 2013-12-03 NOTE — Telephone Encounter (Signed)
Would you like to refer patient to neurology? Please advise, thanks.

## 2013-12-04 NOTE — Telephone Encounter (Signed)
Done

## 2013-12-05 ENCOUNTER — Telehealth: Payer: Self-pay | Admitting: Neurology

## 2013-12-05 NOTE — Telephone Encounter (Signed)
Pt moved appt to 12-14-13 from 01-22-14 notified the referring dr office

## 2013-12-14 ENCOUNTER — Ambulatory Visit (INDEPENDENT_AMBULATORY_CARE_PROVIDER_SITE_OTHER): Payer: Medicare Other | Admitting: Neurology

## 2013-12-14 ENCOUNTER — Encounter: Payer: Self-pay | Admitting: Neurology

## 2013-12-14 VITALS — BP 134/86 | HR 76 | Ht 61.5 in | Wt 197.2 lb

## 2013-12-14 DIAGNOSIS — G501 Atypical facial pain: Secondary | ICD-10-CM

## 2013-12-14 MED ORDER — LIDOCAINE 5 % EX OINT: 1.0000 "application " | TOPICAL_OINTMENT | CUTANEOUS | Status: DC | PRN

## 2013-12-14 NOTE — Patient Instructions (Signed)
1.  Start using lidocaine ointment to your face 2.  If symptoms worsen, please call my office to schedule MRI face 3.  Return to clinic as needed

## 2013-12-14 NOTE — Progress Notes (Signed)
[facility] Clinic Note - Initial Visit   Date: 12/17/2013  Rhonda Henry MRN: [id number] DOB: [birth year]   Dear Dr. Doug Sou:  Thank you for your kind referral of Rhonda Henry for consultation of facial pain. Although her history is well known to you, please allow [place] to reiterate it for the purpose of our medical record. The patient was accompanied to the clinic by self.    History of Present Illness: Rhonda Henry is a 68 y.o. left-handed Caucasian female with hypertension, chronic back pain, depression,  presenting for evaluation of left facial pain.    She went to a dentist in October 2015 and was told she had a cavity.  She had an novocain injection and developed severe, excruciating pain.  She had severe pain that last several days and went back to see her dentist a few days later who adjusted her teeth, however there was no change in pain.  She saw an endodontist who recommended flexeril for possible TMJ.  A few days later, she was given a Medrol dose pack which relieved her pain while she was taking it. She also underwent re-root canal thinking this was the cause of her pain, but again, no improvement.  She had a second opinion by another dentist who felt that her symptoms did not need a root canal.    She eventually saw her pain specialist (established patient for low back pain), who offered her toradol, but this irritated her stomach.  Pain has slowly improved with lidocaine patches.  Now, it feels like a bad tooth ache.  Pain is worse at the end of the day, especially after talking or eating.  Ice or cold beverages help her pain.   She had face lift ~2010 and has always had residual mild asymmetry of the face with the right higher than the lift.   Out-side paper records, electronic medical record, and images have been reviewed where available and summarized as:  Lab Results  Component Value Date   TSH 2.195 08/09/2012   Lab Results    Component Value Date   HGBA1C 5.3 02/15/2006   Lab Results  Component Value Date   [id number] 597 04/29/2009   MRI lumbar spine 11/10/2013:  Stable overall multilevel disc disease and facet disease. Slightly progressive spinal and bilateral lateral recess stenosis at L4-5.   Past Medical History  Diagnosis Date  . Arthritis   . Complication of anesthesia     "I shake like crazy"  . Recurrent UTI   . Microscopic hematuria      x 30 yrs   . Anxiety   . Difficulty sleeping   . Abdominal pain, acute, right upper quadrant   . Bloating   . Constipation   . Nausea   . Depression     Past Surgical History  Procedure Laterality Date  . Facial cosmetic surgery  7-10    compl by thick scars and a scar necrosis on R cheek  . Breast biopsies    . Breast lumpectomy      x 2  . Refractive surgery  1997  . Lumbar epidural injection  Jan 15   . Cyst excision      removed from back and left arm  . C sections      x2  . Cholecystectomy N/A 03/13/2013    Procedure: LAPAROSCOPIC CHOLECYSTECTOMY WITH INTRAOPERATIVE CHOLANGIOGRAM;  Surgeon: [name]. [name], MD;  Location: [facility];  Service: General;  Laterality: N/A;  Medications:  Current Outpatient Prescriptions on File Prior to Visit  Medication Sig Dispense Refill  . cholecalciferol (VITAMIN D) 1000 UNITS tablet Take 1,000 Units by mouth daily.    . clindamycin (CLEOCIN) 150 MG capsule Take 150 mg by mouth. Take after sex    . diazepam (VALIUM) 5 MG tablet Take 1 tablet (5 mg total) by mouth every 12 (twelve) hours as needed for anxiety. 30 tablet 1  . HYDROcodone-acetaminophen (NORCO/VICODIN) 5-325 MG per tablet Take 1 tablet by mouth every 6 (six) hours as needed.     Marland Kitchen ibuprofen (ADVIL,MOTRIN) 600 MG tablet Take 600 mg by mouth every 6 (six) hours as needed.     Marland Kitchen losartan (COZAAR) 50 MG tablet Take 1 tablet (50 mg total) by mouth daily. 90 tablet 3   No current facility-administered medications on file prior to visit.     Allergies:  Allergies  Allergen Reactions  . Penicillins Hives    No reaction listed.   . Tape Hives    Family History: Family History  Problem Relation Age of Onset  . Hypertension Other   . Thyroid cancer Mother   . Heart disease Mother   . Macular degeneration Mother   . Glaucoma Father   . 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  Lives with husband.  They have two grown sons. Highest level of education:  college History   Social History  . Marital Status: Married    Spouse Name: N/A    Number of Children: 2  . Years of Education: N/A   Occupational History  . Nurse, learning disability    Social History Main Topics  . Smoking status: Never Smoker   . Smokeless tobacco: Never Used  . Alcohol Use: 0.0 oz/week    0 Not specified per week     Comment: socially  . Drug Use: No  . Sexual Activity: Yes    Birth Control/ Protection: Other-see comments   Other Topics Concern  . Not on file   Social History Narrative   Lives with husband in a 2 story home.  Has 2 grown children.  Owns her own business.  Education: Forensic psychologist.     Review of Systems:  CONSTITUTIONAL: No fevers, chills, night sweats, or weight loss.   EYES: No visual changes or eye pain ENT: No hearing changes.  No history of nose bleeds.   RESPIRATORY: No cough, wheezing and shortness of breath.   CARDIOVASCULAR: Negative for chest pain, and palpitations.   GI: Negative for abdominal discomfort, blood in stools or black stools.  No recent change in bowel habits.   GU:  No history of incontinence.   MUSCLOSKELETAL: No history of joint pain or swelling.  No myalgias.   SKIN: Negative for lesions, rash, and itching.   HEMATOLOGY/ONCOLOGY: Negative for prolonged bleeding, bruising easily, and swollen nodes.  ENDOCRINE: Negative for cold or heat intolerance, polydipsia or goiter.   PSYCH:  No depression or anxiety symptoms.   NEURO: As Above.   Vital Signs:  BP  134/86 mmHg  Pulse 76  Ht 5' 1.5" (1.562 m)  Wt 197 lb 4 oz (89.472 kg)  BMI 36.67 kg/m2  SpO2 95%   General Medical Exam:   General:  Well appearing, comfortable.   Eyes/ENT: see cranial nerve examination.   Neck: No masses appreciated.  Full range of motion without tenderness.  No carotid bruits. Respiratory:  Clear to auscultation, good air  entry bilaterally.   Cardiac:  Regular rate and rhythm, no murmur.   Extremities:  No deformities, edema, or skin discoloration.  Skin:  No rashes or lesions.  Neurological Exam: MENTAL STATUS including orientation to time, place, person, recent and remote memory, attention span and concentration, language, and fund of knowledge is normal.  Speech is not dysarthric.  CRANIAL NERVES: II:  No visual field defects.  Unremarkable fundi.   III-IV-VI: Pupils equal round and reactive to light.  Normal conjugate, extra-ocular eye movements in all directions of gaze.  No nystagmus.  No ptosis.   V:  Facial sensation is relatively intact bilaterally, except for a small patch over the mid V3 distribution on the left to pin prick.  Inner cheek and lips are intact.   Jaw jerk is absent.   VII:  Mild flattening of the right nasolabial fold (old).  No pathologic facial reflexes.  VIII:  Normal hearing and vestibular function.   IX-X:  Normal palatal movement.   XI:  Normal shoulder shrug and head rotation.   XII:  Normal tongue strength and range of motion, no deviation or fasciculation.  MOTOR:  No atrophy, fasciculations or abnormal movements.  No pronator drift.  Tone is normal.    Right Upper Extremity:    Left Upper Extremity:    Deltoid  5/5   Deltoid  5/5   Biceps  5/5   Biceps  5/5   Triceps  5/5   Triceps  5/5   Wrist extensors  5/5   Wrist extensors  5/5   Wrist flexors  5/5   Wrist flexors  5/5   Finger extensors  5/5   Finger extensors  5/5   Finger flexors  5/5   Finger flexors  5/5   Dorsal interossei  5/5   Dorsal interossei  5/5    Abductor pollicis  5/5   Abductor pollicis  5/5   Tone (Ashworth scale)  0  Tone (Ashworth scale)  0   Right Lower Extremity:    Left Lower Extremity:    Hip flexors  5/5   Hip flexors  5/5   Hip extensors  5/5   Hip extensors  5/5   Knee flexors  5/5   Knee flexors  5/5   Knee extensors  5/5   Knee extensors  5/5   Dorsiflexors  5/5   Dorsiflexors  5/5   Plantarflexors  5/5   Plantarflexors  5/5   Toe extensors  5/5   Toe extensors  5/5   Toe flexors  5/5   Toe flexors  5/5   Tone (Ashworth scale)  0  Tone (Ashworth scale)  0   MSRs:  Right                                                                 Left brachioradialis 2+  brachioradialis 2+  biceps 2+  biceps 2+  triceps 2+  triceps 2+  patellar 2+  patellar 2+  ankle jerk 2+  ankle jerk 2+  Hoffman no  Hoffman no  plantar response down  plantar response down   SENSORY:  Normal and symmetric perception of light touch, pinprick, vibration, and proprioception.  Romberg's sign absent.   COORDINATION/GAIT: Normal finger-to- nose-finger and heel-to-shin.  Intact rapid alternating movements bilaterally.  Able to rise from a chair without using arms.  Gait narrow based and stable. Tandem and stressed gait intact.    IMPRESSION/PLAN: Ms. 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 Although it is possible that she may have had injury to distal trigeminal branch during local nerve block injection which can be expected with such procedures, since symptoms are improving and not worsening, I reassured her that severe nerve injury is unlikely.  MRI face can be done going forward if symptoms worsen, but I explained that yield of finding something that would change management, is low given her relative non-focal exam.  In the meantime, she can start to use lidocaine ointment to her face.  If no improvement, MRI face wwo contrast will be done next.  Return to clinic as needed.   The  duration of this appointment visit was 40 minutes of face-to-face time with the patient.  Greater than 50% of this time was spent in counseling, explanation of diagnosis, planning of further management, and coordination of care.   Thank you for allowing me to participate in patient's care.  If I can answer any additional questions, I would be pleased to do so.    Sincerely,    Aaryana Betke K. Posey Pronto, DO

## 2013-12-17 ENCOUNTER — Telehealth: Payer: Self-pay | Admitting: Neurology

## 2013-12-17 NOTE — Telephone Encounter (Signed)
Patient was in a lot of pain the weekend after she saw you.  She said that you had talked about doing MRI and patient would like to have it done.

## 2013-12-17 NOTE — Telephone Encounter (Signed)
Please order MRI face wwo contrast - in comments write "evaluate for left trigeminal neuralgia, patient with left facial pain following dental procedure"

## 2013-12-17 NOTE — Telephone Encounter (Signed)
Pt would like to talk with you about what is going on with her please call [phone]

## 2013-12-18 ENCOUNTER — Other Ambulatory Visit: Payer: Self-pay | Admitting: *Deleted

## 2013-12-19 ENCOUNTER — Other Ambulatory Visit: Payer: Self-pay | Admitting: *Deleted

## 2013-12-19 DIAGNOSIS — R51 Headache: Principal | ICD-10-CM

## 2013-12-19 DIAGNOSIS — R519 Headache, unspecified: Secondary | ICD-10-CM

## 2013-12-19 NOTE — Telephone Encounter (Signed)
Left message for patient to call me back.  MRI is set up for December 23 at [place].  Patient will need to arrive at 5:55 pm.

## 2013-12-24 NOTE — Telephone Encounter (Signed)
Called patient again and left message about the appointment.  Requested for her to call me and let me know when she gets the message.

## 2013-12-25 NOTE — Telephone Encounter (Signed)
I spoke with [facility] and let her know that I have called the patient x 2 about her MRI but have gotten no answer.  She said that they will contact patient also about her appointment.

## 2013-12-26 ENCOUNTER — Other Ambulatory Visit: Payer: Medicare Other

## 2013-12-26 ENCOUNTER — Inpatient Hospital Stay: Admission: RE | Admit: 2013-12-26 | Payer: Medicare Other | Source: Ambulatory Visit

## 2013-12-31 ENCOUNTER — Telehealth: Payer: Self-pay | Admitting: *Deleted

## 2013-12-31 ENCOUNTER — Other Ambulatory Visit: Payer: Self-pay | Admitting: *Deleted

## 2013-12-31 DIAGNOSIS — R519 Headache, unspecified: Secondary | ICD-10-CM

## 2013-12-31 DIAGNOSIS — R51 Headache: Principal | ICD-10-CM

## 2013-12-31 NOTE — Telephone Encounter (Signed)
Patient informed that her MRI has been changed.  Phone # given to change appointment date and time.

## 2014-01-03 ENCOUNTER — Telehealth: Payer: Self-pay | Admitting: Neurology

## 2014-01-03 NOTE — Telephone Encounter (Signed)
Left message informing patient that Dr. Posey Pronto does not prescribe pain medication and advised her to call her PCP.

## 2014-01-03 NOTE — Telephone Encounter (Signed)
Pt needs something for pain please call [phone]

## 2014-01-07 ENCOUNTER — Ambulatory Visit
Admission: RE | Admit: 2014-01-07 | Discharge: 2014-01-07 | Disposition: A | Discharge status: Home / Self Care | Payer: Medicare Other | Source: Ambulatory Visit | Attending: Neurology | Admitting: Neurology

## 2014-01-07 ENCOUNTER — Other Ambulatory Visit: Payer: Self-pay | Admitting: Geriatric Medicine

## 2014-01-07 ENCOUNTER — Telehealth: Payer: Self-pay | Admitting: Internal Medicine

## 2014-01-07 DIAGNOSIS — R51 Headache: Secondary | ICD-10-CM | POA: Diagnosis not present

## 2014-01-07 DIAGNOSIS — R519 Headache, unspecified: Secondary | ICD-10-CM

## 2014-01-07 MED ORDER — LOSARTAN POTASSIUM 50 MG PO TABS: 50.0000 mg | ORAL_TABLET | Freq: Every day | ORAL | Status: DC

## 2014-01-07 MED ORDER — GADOBENATE DIMEGLUMINE 529 MG/ML IV SOLN
18.0000 mL | Freq: Once | INTRAVENOUS | Status: AC | PRN
  Administered 2014-01-07: 18 mL via INTRAVENOUS

## 2014-01-07 NOTE — Telephone Encounter (Signed)
Losartan sent to [facility] on [address]. Left message on vm informing patient.

## 2014-01-07 NOTE — Telephone Encounter (Signed)
Pt came by office requesting Rx renewal for LOSARTAN. Pt would like this request sent to [facility] on [address] and [address] and assigned as new pharmacy for all future requests. Please contact pt when request is reviewed.

## 2014-01-08 ENCOUNTER — Other Ambulatory Visit: Payer: Medicare Other

## 2014-01-08 ENCOUNTER — Telehealth: Payer: Self-pay | Admitting: Neurology

## 2014-01-08 NOTE — Telephone Encounter (Signed)
Called and discuss MRI face results.  Explained that imaging shows bo definite peripheral pathologic finding of the trigeminal nerves, although there is a small area of signal abnormality involving the posterior body of the mandible on the left that could possibly relate to V3.  CT mandible can be ordered to better assess.  Patient reports doing slightly better and is now seeing another dentist so would like to hold off on additional imaging.  Encouraged her to share these results with her dentist. If symptoms worsen, low threshold to order CT mandible.  Adeoluwa Silvers K. Posey Pronto, DO

## 2014-01-10 ENCOUNTER — Other Ambulatory Visit: Payer: Self-pay | Admitting: *Deleted

## 2014-01-10 ENCOUNTER — Telehealth: Payer: Self-pay | Admitting: Internal Medicine

## 2014-01-10 DIAGNOSIS — R519 Headache, unspecified: Secondary | ICD-10-CM

## 2014-01-10 DIAGNOSIS — R51 Headache: Principal | ICD-10-CM

## 2014-01-10 NOTE — Telephone Encounter (Signed)
Would you like to order a CT for this patient.

## 2014-01-10 NOTE — Telephone Encounter (Signed)
CT has been scheduled for January 11 at 12:30.  Patient is aware of the appointment time and date.

## 2014-01-10 NOTE — Telephone Encounter (Signed)
Rhonda Pina, do you mind calling radiology and confirming that CT face will be adequate study to look at the mandible?  If so, please order the study wwo contrast.   Thanks.

## 2014-01-10 NOTE — Telephone Encounter (Signed)
Is this an order that you can place? I'm not sure which CT to order and don't want to get one that doesn't have the images that you need to evaluate V3. If you cannot can you send me the name of the test you want ordered? Thanks, Dr. Doug Sou

## 2014-01-10 NOTE — Telephone Encounter (Signed)
Pt is requesting a referral for CT/Face. Pt had MRI/Face results in her chart. Dr Posey Pronto is telling pt that PCP would need to order CT.  [phone].

## 2014-01-14 ENCOUNTER — Ambulatory Visit
Admission: RE | Admit: 2014-01-14 | Discharge: 2014-01-14 | Disposition: A | Discharge status: Home / Self Care | Payer: Medicare Other | Source: Ambulatory Visit | Attending: Neurology | Admitting: Neurology

## 2014-01-14 DIAGNOSIS — R519 Headache, unspecified: Secondary | ICD-10-CM

## 2014-01-14 DIAGNOSIS — R51 Headache: Principal | ICD-10-CM

## 2014-01-14 MED ORDER — IOHEXOL 300 MG/ML  SOLN
100.0000 mL | Freq: Once | INTRAMUSCULAR | Status: AC | PRN
  Administered 2014-01-14: 100 mL via INTRAVENOUS

## 2014-01-15 ENCOUNTER — Telehealth: Payer: Self-pay | Admitting: Neurology

## 2014-01-15 NOTE — Telephone Encounter (Signed)
I attempted to contact patient via phone today regarding the results of CT face, however there was no answer so a message was left for the patient to return my call.  There is a mention of benign sclerotic changes corresponding to area of reduced signed on her MRI, which is most likely incidental.  No signs of infection or joint destruction.  Repeat imaging can be done in 6-12 month, if clinically indicated.  Tamarah Bhullar K. Posey Pronto, DO

## 2014-01-18 DIAGNOSIS — M2662 Arthralgia of temporomandibular joint: Secondary | ICD-10-CM | POA: Diagnosis not present

## 2014-01-18 DIAGNOSIS — G501 Atypical facial pain: Secondary | ICD-10-CM | POA: Diagnosis not present

## 2014-01-22 ENCOUNTER — Ambulatory Visit: Payer: Medicare Other | Admitting: Neurology

## 2014-02-05 DIAGNOSIS — M2662 Arthralgia of temporomandibular joint: Secondary | ICD-10-CM | POA: Diagnosis not present

## 2014-02-05 DIAGNOSIS — G501 Atypical facial pain: Secondary | ICD-10-CM | POA: Diagnosis not present

## 2014-02-15 ENCOUNTER — Ambulatory Visit (INDEPENDENT_AMBULATORY_CARE_PROVIDER_SITE_OTHER): Payer: Medicare Other | Admitting: Internal Medicine

## 2014-02-15 ENCOUNTER — Encounter: Payer: Self-pay | Admitting: Internal Medicine

## 2014-02-15 VITALS — BP 120/80 | HR 79 | Temp 97.6°F | Resp 14 | Ht 61.0 in | Wt 197.0 lb

## 2014-02-15 DIAGNOSIS — J309 Allergic rhinitis, unspecified: Secondary | ICD-10-CM | POA: Insufficient documentation

## 2014-02-15 DIAGNOSIS — J3089 Other allergic rhinitis: Secondary | ICD-10-CM

## 2014-02-15 DIAGNOSIS — B370 Candidal stomatitis: Secondary | ICD-10-CM | POA: Insufficient documentation

## 2014-02-15 MED ORDER — DIAZEPAM 5 MG PO TABS: 5.0000 mg | ORAL_TABLET | Freq: Two times a day (BID) | ORAL | Status: DC | PRN

## 2014-02-15 MED ORDER — CETIRIZINE HCL 10 MG PO TABS: 10.0000 mg | ORAL_TABLET | Freq: Every day | ORAL | Status: DC

## 2014-02-15 MED ORDER — NYSTATIN 100000 UNIT/ML MT SUSP: 5.0000 mL | Freq: Three times a day (TID) | OROMUCOSAL | Status: DC

## 2014-02-15 MED ORDER — ALBUTEROL SULFATE 108 (90 BASE) MCG/ACT IN AEPB: 2.0000 | INHALATION_SPRAY | RESPIRATORY_TRACT | Status: DC | PRN

## 2014-02-15 NOTE — Assessment & Plan Note (Signed)
Nystatin swish and swallow for 3-5 days. Some pain when she is eating and so could involve the esophagus as well. If no clearance will re-evaluate.

## 2014-02-15 NOTE — Progress Notes (Signed)
   Subjective:    Patient ID: Rhonda Henry, female    DOB: 1945/12/21, 69 y.o.   MRN: [id number]  HPI The patient is a 69 YO female who is coming in for a cold and white tongue. She has had a saga with her dental problems and has been to the neurologist. While trying to solve the problem she has had steroid courses as well as injections in her jaw. She noticed a white tongue that was sore in the last 3-5 days. She tried brushing it and this helped but it came right back. She also has congestions and nasal drainage. She has sore throat and mild cough. She took mucinex this morning which her husband states helped her to cough less. She has not tried anything other than the mucinex and nyquil (which has helped her night time cough). She does not want to get sicker and have to take more antibiotics. She is still having dental problems. She feels like the cold symptoms have gotten worse over the last few days. The tongue has stayed the same.   Review of Systems  Constitutional: Positive for chills. Negative for fever, activity change, appetite change, fatigue and unexpected weight change.  HENT: Positive for congestion, rhinorrhea, sore throat and trouble swallowing. Negative for drooling, ear discharge, ear pain, sinus pressure and sneezing.   Eyes: Negative.   Respiratory: Positive for cough and shortness of breath. Negative for chest tightness and wheezing.   Cardiovascular: Negative for chest pain, palpitations and leg swelling.  Gastrointestinal: Negative for abdominal pain, diarrhea, constipation and abdominal distention.  Musculoskeletal: Negative.   Skin: Positive for color change.       Tongue with white plaque.  Neurological:       Still some dental related problems.   Psychiatric/Behavioral: Negative.       Objective:   Physical Exam  Constitutional: She is oriented to person, place, and time. She appears well-developed and well-nourished. No distress.  HENT:  Head: Normocephalic  and atraumatic.  Right Ear: External ear normal.  Left Ear: External ear normal.  Tongue with white plaque, mild clear drainage at the back of the throat, no purulent discharge. Nasal mucosa red and swollen. No discrete cervical LAD.   Eyes: EOM are normal.  Neck: Normal range of motion.  Cardiovascular: Normal rate and regular rhythm.   Pulmonary/Chest: Effort normal and breath sounds normal.  Mild wheeze which clears with coughing.   Abdominal: Soft. Bowel sounds are normal.  Musculoskeletal: She exhibits no edema.  Neurological: She is alert and oriented to person, place, and time.  Skin: Skin is warm and dry.  Psychiatric: She has a normal mood and affect. Her behavior is normal.   Filed Vitals:   02/15/14 1427  BP: 120/80  Pulse: 79  Temp: 97.6 F (36.4 C)  TempSrc: Oral  Resp: 14  Height: 5\' 1"  (1.549 m)  Weight: 197 lb (89.359 kg)  SpO2: 94%      Assessment & Plan:

## 2014-02-15 NOTE — Progress Notes (Signed)
Pre visit review using our clinic review tool, if applicable. No additional management support is needed unless otherwise documented below in the visit note. 

## 2014-02-15 NOTE — Assessment & Plan Note (Signed)
Will rx zyrtec and give it about 5 days for clearance. No indication for antibiotics at this time.

## 2014-02-15 NOTE — Patient Instructions (Signed)
1. Take zytec (cetirizine) daily starting today to help with the drainage. You can keep using the mucinex and nyquil and alleve cold at home for symptoms.  2. We have sent in nystatin liquid that you will use 5 mL 3 times a day for the next 4-5 days.   3. We have refilled the valium.   4. If you are feeling just as bad or worse by next Wednesday please call us back.  Upper Respiratory Infection, Adult An upper respiratory infection (URI) is also sometimes known as the common cold. The upper respiratory tract includes the nose, sinuses, throat, trachea, and bronchi. Bronchi are the airways leading to the lungs. Most people improve within 1 week, but symptoms can last up to 2 weeks. A residual cough may last even longer.  CAUSES Many different viruses can infect the tissues lining the upper respiratory tract. The tissues become irritated and inflamed and often become very moist. Mucus production is also common. A cold is contagious. You can easily spread the virus to others by oral contact. This includes kissing, sharing a glass, coughing, or sneezing. Touching your mouth or nose and then touching a surface, which is then touched by another person, can also spread the virus. SYMPTOMS  Symptoms typically develop 1 to 3 days after you come in contact with a cold virus. Symptoms vary from person to person. They may include:  Runny nose.  Sneezing.  Nasal congestion.  Sinus irritation.  Sore throat.  Loss of voice (laryngitis).  Cough.  Fatigue.  Muscle aches.  Loss of appetite.  Headache.  Low-grade fever. DIAGNOSIS  You might diagnose your own cold based on familiar symptoms, since most people get a cold 2 to 3 times a year. Your caregiver can confirm this based on your exam. Most importantly, your caregiver can check that your symptoms are not due to another disease such as strep throat, sinusitis, pneumonia, asthma, or epiglottitis. Blood tests, throat tests, and X-rays are not  necessary to diagnose a common cold, but they may sometimes be helpful in excluding other more serious diseases. Your caregiver will decide if any further tests are required. RISKS AND COMPLICATIONS  You may be at risk for a more severe case of the common cold if you smoke cigarettes, have chronic heart disease (such as heart failure) or lung disease (such as asthma), or if you have a weakened immune system. The very young and very old are also at risk for more serious infections. Bacterial sinusitis, middle ear infections, and bacterial pneumonia can complicate the common cold. The common cold can worsen asthma and chronic obstructive pulmonary disease (COPD). Sometimes, these complications can require emergency medical care and may be life-threatening. PREVENTION  The best way to protect against getting a cold is to practice good hygiene. Avoid oral or hand contact with people with cold symptoms. Wash your hands often if contact occurs. There is no clear evidence that vitamin C, vitamin E, echinacea, or exercise reduces the chance of developing a cold. However, it is always recommended to get plenty of rest and practice good nutrition. TREATMENT  Treatment is directed at relieving symptoms. There is no cure. Antibiotics are not effective, because the infection is caused by a virus, not by bacteria. Treatment may include:  Increased fluid intake. Sports drinks offer valuable electrolytes, sugars, and fluids.  Breathing heated mist or steam (vaporizer or shower).  Eating chicken soup or other clear broths, and maintaining good nutrition.  Getting plenty of rest.  Using  gargles or lozenges for comfort.  Controlling fevers with ibuprofen or acetaminophen as directed by your caregiver.  Increasing usage of your inhaler if you have asthma. Zinc gel and zinc lozenges, taken in the first 24 hours of the common cold, can shorten the duration and lessen the severity of symptoms. Pain medicines may help  with fever, muscle aches, and throat pain. A variety of non-prescription medicines are available to treat congestion and runny nose. Your caregiver can make recommendations and may suggest nasal or lung inhalers for other symptoms.  HOME CARE INSTRUCTIONS   Only take over-the-counter or prescription medicines for pain, discomfort, or fever as directed by your caregiver.  Use a warm mist humidifier or inhale steam from a shower to increase air moisture. This may keep secretions moist and make it easier to breathe.  Drink enough water and fluids to keep your urine clear or pale yellow.  Rest as needed.  Return to work when your temperature has returned to normal or as your caregiver advises. You may need to stay home longer to avoid infecting others. You can also use a face mask and careful hand washing to prevent spread of the virus. SEEK MEDICAL CARE IF:   After the first few days, you feel you are getting worse rather than better.  You need your caregiver's advice about medicines to control symptoms.  You develop chills, worsening shortness of breath, or brown or red sputum. These may be signs of pneumonia.  You develop yellow or brown nasal discharge or pain in the face, especially when you bend forward. These may be signs of sinusitis.  You develop a fever, swollen neck glands, pain with swallowing, or white areas in the back of your throat. These may be signs of strep throat. SEEK IMMEDIATE MEDICAL CARE IF:   You have a fever.  You develop severe or persistent headache, ear pain, sinus pain, or chest pain.  You develop wheezing, a prolonged cough, cough up blood, or have a change in your usual mucus (if you have chronic lung disease).  You develop sore muscles or a stiff neck. Document Released: 06/16/2000 Document Revised: 03/15/2011 Document Reviewed: 03/28/2013 [facility] Patient Information 2015 [place], [place]. This information is not intended to replace advice given to you by  your health care provider. Make sure you discuss any questions you have with your health care provider.

## 2014-03-01 ENCOUNTER — Telehealth: Payer: Self-pay | Admitting: Internal Medicine

## 2014-03-01 MED ORDER — SULFAMETHOXAZOLE-TRIMETHOPRIM 800-160 MG PO TABS: 1.0000 | ORAL_TABLET | ORAL | Status: DC | PRN

## 2014-03-01 NOTE — Telephone Encounter (Signed)
Done

## 2014-03-01 NOTE — Telephone Encounter (Signed)
Pt called in and requested to see if Dr Doug Sou could refill sulfamethoxazole-trimethoprim (BACTRIM DS) 800-160 MG per tablet [[id number]].   Pt stated that he has taken it for a long time and she has to use it post intercourse.

## 2014-03-18 ENCOUNTER — Telehealth: Payer: Self-pay | Admitting: Gastroenterology

## 2014-03-18 NOTE — Telephone Encounter (Signed)
Patient has been on vicoden , naproxen and flexeril for a "botched dental procedure". She does not eat very much due to jaw pain. She complains of pain like "acute heartburn" after eating. She has a bloated feeling. Her bowel movements oscillate from loose to constipated. She had a normal colonoscopy about 7 years ago. Appointment made.

## 2014-03-20 ENCOUNTER — Ambulatory Visit: Payer: Medicare Other | Admitting: Physician Assistant

## 2014-03-21 DIAGNOSIS — M2662 Arthralgia of temporomandibular joint: Secondary | ICD-10-CM | POA: Diagnosis not present

## 2014-03-21 DIAGNOSIS — M791 Myalgia: Secondary | ICD-10-CM | POA: Diagnosis not present

## 2014-03-21 DIAGNOSIS — G508 Other disorders of trigeminal nerve: Secondary | ICD-10-CM | POA: Diagnosis not present

## 2014-04-09 DIAGNOSIS — G508 Other disorders of trigeminal nerve: Secondary | ICD-10-CM | POA: Diagnosis not present

## 2014-04-09 DIAGNOSIS — M791 Myalgia: Secondary | ICD-10-CM | POA: Diagnosis not present

## 2014-04-09 DIAGNOSIS — M2662 Arthralgia of temporomandibular joint: Secondary | ICD-10-CM | POA: Diagnosis not present

## 2014-04-19 ENCOUNTER — Other Ambulatory Visit: Payer: Self-pay

## 2014-04-19 DIAGNOSIS — Z1231 Encounter for screening mammogram for malignant neoplasm of breast: Secondary | ICD-10-CM

## 2014-04-29 NOTE — Telephone Encounter (Signed)
Encounter closed

## 2014-05-13 DIAGNOSIS — R202 Paresthesia of skin: Secondary | ICD-10-CM | POA: Diagnosis not present

## 2014-05-13 DIAGNOSIS — B029 Zoster without complications: Secondary | ICD-10-CM | POA: Diagnosis not present

## 2014-05-16 DIAGNOSIS — L309 Dermatitis, unspecified: Secondary | ICD-10-CM | POA: Diagnosis not present

## 2014-05-17 ENCOUNTER — Ambulatory Visit (INDEPENDENT_AMBULATORY_CARE_PROVIDER_SITE_OTHER): Payer: Medicare Other | Admitting: Family Medicine

## 2014-05-17 VITALS — BP 142/88 | HR 82 | Temp 97.8°F | Resp 18 | Ht 61.0 in | Wt 189.0 lb

## 2014-05-17 DIAGNOSIS — L509 Urticaria, unspecified: Secondary | ICD-10-CM

## 2014-05-17 MED ORDER — EPINEPHRINE 0.3 MG/0.3ML IJ SOAJ: 0.3000 mg | Freq: Once | INTRAMUSCULAR | Status: DC

## 2014-05-17 MED ORDER — PREDNISONE 20 MG PO TABS: ORAL_TABLET | ORAL | Status: DC

## 2014-05-17 NOTE — Patient Instructions (Signed)
Use the prednisone as directed.  Let us know if your rash does not clear up soon Become familiar with your epipen just in case you ever had swelling that impeded your breathing- you will likely never need it but better to be safe

## 2014-05-17 NOTE — Progress Notes (Signed)
[facility] [address] [place] [address] [phone]  Date:  05/17/2014   Name:  Rhonda Henry   DOB:  1945/01/31   MRN:  [id number]  PCP:  Olga Millers, MD    Chief Complaint: Rash and Chills   History of Present Illness:  Rhonda Henry is a 69 y.o. very pleasant female patient who presents with the following:  Here today with a rash that she first noted on her left jaw area about one week ago. The rash started the day after she cleared some vines in the garden- however she thought it was all just [place].  She went to see her derm who thought it was likely contact dermatitis.  She has tried Human resources officer but rash seems to be getting worse  It spread to the right side of her face, and seems to be spreading elsewhere on her arms and trunk as well.   She notes that she is dizzy and having a hard time swallowing.  She states that she feels lightheaded and that her mouth is very dry and "full of cotton."  She does not have any angioedema or wheezing.  She feels like the rash is "hot" and she is using ice packs on the area.  She has also tried oatmeal bath and benadryl cream.   Patient Active Problem List   Diagnosis Date Noted  . Thrush 02/15/2014  . Allergic rhinitis 02/15/2014  . Dental disease 11/25/2013  . Obesity 09/02/2013  . Multinodular goiter 02/26/2013  . Liver hemangioma 08/09/2012  . Hematuria 08/09/2012  . Lumbar herniated disc 05/02/2012  . 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 VITAMIN D DEFICIENCY 05/26/2009  . Essential hypertension 07/30/2006  . UTI'S, HX OF 07/30/2006    Past Medical History  Diagnosis Date  . Arthritis   . Complication of anesthesia     "I shake like crazy"  . Recurrent UTI   . Microscopic hematuria      x 30 yrs   . Anxiety   . Difficulty sleeping   . Abdominal pain, acute, right upper quadrant   . Bloating   . Constipation   .  Nausea   . Depression   . Neuromuscular disorder   . Nerve damage     face     Past Surgical History  Procedure Laterality Date  . Facial cosmetic surgery  7-10    compl by thick scars and a scar necrosis on R cheek  . Breast biopsies    . Breast lumpectomy      x 2  . Refractive surgery  1997  . Lumbar epidural injection  Jan 15   . Cyst excision      removed from back and left arm  . C sections      x2  . Cholecystectomy N/A 03/13/2013    Procedure: LAPAROSCOPIC CHOLECYSTECTOMY WITH INTRAOPERATIVE CHOLANGIOGRAM;  Surgeon: [name]. [name], MD;  Location: [facility];  Service: General;  Laterality: N/A;    History  Substance Use Topics  . Smoking status: Never Smoker   . Smokeless tobacco: Never Used  . Alcohol Use: 0.0 oz/week    0 Standard drinks or equivalent per week     Comment: socially    Family History  Problem Relation Age of Onset  . Hypertension Other   . Thyroid cancer Mother   . Heart disease Mother   . Macular degeneration Mother   .  Glaucoma Father   . Colon cancer Neg Hx     Allergies  Allergen Reactions  . Penicillins Hives    No reaction listed.   . Tape Hives    Medication list has been reviewed and updated.  Current Outpatient Prescriptions on File Prior to Visit  Medication Sig Dispense Refill  . clindamycin (CLEOCIN) 150 MG capsule Take 150 mg by mouth. Take after sex    . diazepam (VALIUM) 5 MG tablet Take 1 tablet (5 mg total) by mouth every 12 (twelve) hours as needed for anxiety. 30 tablet 1  . ibuprofen (ADVIL,MOTRIN) 600 MG tablet Take 600 mg by mouth every 6 (six) hours as needed.     Marland Kitchen losartan (COZAAR) 50 MG tablet Take 1 tablet (50 mg total) by mouth daily. 90 tablet 3  . HYDROcodone-acetaminophen (NORCO/VICODIN) 5-325 MG per tablet Take 1 tablet by mouth every 6 (six) hours as needed.      No current facility-administered medications on file prior to visit.    Review of Systems:  As per HPI- otherwise negative. She uses  clindamycin as needed Valium as needed   Physical Examination: Filed Vitals:   05/17/14 1736  BP: 142/88  Pulse: 82  Temp: 97.8 F (36.6 C)  Resp: 18   Filed Vitals:   05/17/14 1736  Height: 5\' 1"  (1.549 m)  Weight: 189 lb (85.73 kg)   Body mass index is 35.73 kg/(m^2). Ideal Body Weight: Weight in (lb) to have BMI = 25: 132  GEN: WDWN, NAD, Non-toxic, A & O x 3, overweight, looks well HEENT: Atraumatic, Normocephalic. Neck supple. No masses, No LAD.  Bilateral TM wnl, oropharynx normal.  PEERL,EOMI.  No angioedema She has mild diffuse hives on her arms and chest.  These are blanching- no petechiae. She also has hives on her bilateral jawline- this is her main concern.   Ears and Nose: No external deformity. CV: RRR, No M/G/R. No JVD. No thrill. No extra heart sounds. PULM: CTA B, no wheezes, crackles, rhonchi. No retractions. No resp. distress. No accessory muscle use. EXTR: No c/c/e NEURO Normal gait.  PSYCH: Normally interactive. Conversant. Not depressed or anxious appearing.  Calm demeanor.   See orthostatics.    160/95, pulse 87    151/96, pulse 85    124/85, pulse 85  Assessment and Plan: Hives - Plan: predniSONE (DELTASONE) 20 MG tablet, EPINEPHrine (EPIPEN 2-PAK) 0.3 mg/0.3 mL IJ SOAJ injection  She appears to have hives, likely related to recent gardening work.  At this time I do not see evidence of any more dangerous patholgoy. Suspect that some of her other sx may be due to allegra use or anxiety.   She has used prednisone in the past without difficulty and would like to try this again Prednisone as below, epipen rx in case of any more serious allergic reaction Asked her to follow-up if not better soon  Meds ordered this encounter  Medications  . pregabalin (LYRICA) 75 MG capsule    Sig: Take 75 mg by mouth 2 (two) times daily.  Marland Kitchen desipramine (NOPRAMIN) 150 MG tablet    Sig: Take 150 mg by mouth at bedtime.  . predniSONE (DELTASONE) 20 MG tablet    Sig:  Take 2 pills daily for 5 days, then 1 pill daily for 5 days    Dispense:  15 tablet    Refill:  0  . EPINEPHrine (EPIPEN 2-PAK) 0.3 mg/0.3 mL IJ SOAJ injection    Sig: Inject 0.3 mLs (  0.3 mg total) into the muscle once.    Dispense:  1 Device    Refill:  3    Signed Lamar Blinks, MD

## 2014-05-18 ENCOUNTER — Ambulatory Visit: Payer: Medicare Other | Admitting: Family Medicine

## 2014-05-28 ENCOUNTER — Ambulatory Visit: Payer: Medicare Other | Admitting: Internal Medicine

## 2014-05-28 ENCOUNTER — Ambulatory Visit: Payer: Medicare Other

## 2014-05-29 DIAGNOSIS — R6884 Jaw pain: Secondary | ICD-10-CM | POA: Diagnosis not present

## 2014-05-29 DIAGNOSIS — G8929 Other chronic pain: Secondary | ICD-10-CM | POA: Diagnosis not present

## 2014-05-29 DIAGNOSIS — G500 Trigeminal neuralgia: Secondary | ICD-10-CM | POA: Diagnosis not present

## 2014-05-29 NOTE — Progress Notes (Signed)
Patient ID: Rhonda Henry, female   DOB: 12-Jul-1945, 69 y.o.   MRN: [id number]      Cardiology Office Note   Date:  05/31/2014   ID:  CONNELLY SPRUELL, DOB 02-06-1945, MRN [id number]  PCP:  Olga Millers, MD  Cardiologist:   Jenkins Rouge, MD   No chief complaint on file.     History of Present Illness: Rhonda Henry is a 69 y.o. female who presents for plaque in carotid artery.  Seen by oral surgeon and ? Xray indicated plaque in carotid.  Last seen by cardiology in 2012  At that time sent for ? 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 Arthritis   . Complication of anesthesia     "I shake like crazy"  . Recurrent UTI   . Microscopic hematuria      x 30 yrs   . Anxiety   . Difficulty sleeping   . Abdominal pain, acute, right upper quadrant   . Bloating   . Constipation   . Nausea   . Depression   . Neuromuscular disorder   . Nerve damage     face     Past Surgical History  Procedure Laterality Date  . Facial cosmetic surgery  7-10    compl by thick scars and a scar necrosis on R cheek  . Breast biopsies    . Breast lumpectomy      x 2  . Refractive surgery  1997  . Lumbar epidural injection  Jan 15   . Cyst excision      removed from back and left arm  . C sections      x2  . Cholecystectomy N/A 03/13/2013    Procedure: LAPAROSCOPIC CHOLECYSTECTOMY WITH INTRAOPERATIVE CHOLANGIOGRAM;  Surgeon: [name]. [name], MD;   Location: [facility];  Service: General;  Laterality: N/A;     Current Outpatient Prescriptions  Medication Sig Dispense Refill  . clindamycin (CLEOCIN) 150 MG capsule Take 150 mg by mouth. Take after sex    . diazepam (VALIUM) 5 MG tablet Take 1 tablet (5 mg total) by mouth every 12 (twelve) hours as needed for anxiety. 30 tablet 1  . EPINEPHrine (EPIPEN 2-PAK) 0.3 mg/0.3 mL IJ SOAJ injection Inject 0.3 mLs (0.3 mg total) into the muscle once. 1 Device 3  . ibuprofen (ADVIL,MOTRIN) 600 MG tablet Take 600 mg by mouth every 6 (six) hours as needed.     Marland Kitchen losartan (COZAAR) 50 MG tablet Take 1 tablet (50 mg total) by mouth daily. 90 tablet 3  . pregabalin (LYRICA) 75 MG capsule Take 75 mg by mouth 2 (two) times daily.     No current facility-administered medications for this visit.    Allergies:   Penicillin g; Penicillins; Desipramine hcl; and Tape    Social History:  The patient  reports that she has never smoked. She has never used  smokeless tobacco. She reports that she drinks alcohol. She reports that she does not use illicit drugs.   Family History:  The patient's family history includes Glaucoma in her father; Heart disease in her mother; Hypertension in her other; Macular degeneration in her mother; Thyroid cancer in her mother. There is no history of Colon cancer.    ROS:  Please see the history of present illness.   Otherwise, review of systems are positive for none.   All other systems are reviewed and negative.    PHYSICAL EXAM: VS:  BP 122/74 mmHg  Pulse 82  Ht 5\' 1"  (1.549 m)  Wt 86.183 kg (190 lb)  BMI 35.92 kg/m2  SpO2 99% , BMI Body mass index is 35.92 kg/(m^2). Affect appropriate Overweight white female  HEENT: normal Neck supple with no adenopathy JVP normal no bruits no thyromegaly Lungs clear with no wheezing and good diaphragmatic motion Heart:  S1/S2 no murmur, no rub, gallop or click PMI normal Abdomen: benighn, BS positve, no tenderness, no AAA no bruit.   No HSM or HJR Distal pulses intact with no bruits No edema Neuro non-focal Skin warm and dry No muscular weakness    EKG:   10/09/13  SR rate 81 ICRBBB otherwise normal    Recent Labs: 10/09/2013: ALT 10; BUN 20; Creatinine 0.72; Potassium 4.1; Sodium 140    Lipid Panel    Component Value Date/Time   CHOL 195 08/09/2012 1041   TRIG 102 08/09/2012 1041   HDL 45 08/09/2012 1041   CHOLHDL 4.3 08/09/2012 1041   VLDL 20 08/09/2012 1041   LDLCALC 130* 08/09/2012 1041      Wt Readings from Last 3 Encounters:  05/31/14 86.183 kg (190 lb)  05/30/14 86.637 kg (191 lb)  05/17/14 85.73 kg (189 lb)      Other studies Reviewed: Additional studies/ records that were reviewed today include:  CT Leane Call 04/10/14.    ASSESSMENT AND PLAN:  1.  Carotid Disease:  Plaque in 2014 and CT done by oral surgeon for facial pain shows calcification.  F/U carotid duplex  81 mg ASA Will check lipid and liver profile when she comes for duplex  Also discussed coronary calcium score and vascular screening tests with  Patient who will decide if she wants to pay for anything additional out of pocket 2. HTN:  Well controlled.  Continue current medications and low sodium Dash type diet.   3. Obesity :  Discussed exercise and low carb diet fu primary    Current medicines are reviewed at length with the patient today.  The patient does not have concerns regarding medicines.  The following changes have been made:  no change  Labs/ tests ordered today include:  Carotid Duplex   No orders of the defined types were placed in this encounter.     Disposition:   FU with me PRN      Signed, Jenkins Rouge, MD  05/31/2014 9:34 AM    [facility] [place], [place], [place]  [address] Phone: [phone]; Fax: [phone]

## 2014-05-30 ENCOUNTER — Encounter: Payer: Self-pay | Admitting: Internal Medicine

## 2014-05-30 ENCOUNTER — Ambulatory Visit (INDEPENDENT_AMBULATORY_CARE_PROVIDER_SITE_OTHER): Payer: Medicare Other | Admitting: Internal Medicine

## 2014-05-30 ENCOUNTER — Other Ambulatory Visit: Payer: Self-pay | Admitting: Internal Medicine

## 2014-05-30 VITALS — BP 134/76 | HR 86 | Temp 98.4°F | Resp 16 | Wt 191.0 lb

## 2014-05-30 DIAGNOSIS — K089 Disorder of teeth and supporting structures, unspecified: Secondary | ICD-10-CM

## 2014-05-30 MED ORDER — DULOXETINE HCL 30 MG PO CPEP: 30.0000 mg | ORAL_CAPSULE | Freq: Every day | ORAL | Status: DC

## 2014-05-30 NOTE — Progress Notes (Signed)
   Subjective:    Patient ID: Rhonda Henry, female    DOB: 17-Dec-1945, 69 y.o.   MRN: [id number]  HPI The patient is a 69 YO female coming in for follow up of her nerve pain in her face. It all started with dental procedure and is still giving her problems. She has seen numerous specialists and now is taking desipramine and lyrica. The lyrica seems to help but is expensive. The desipramine is making her excessively sleepy during the day and makes her pass out at night (she takes at night time). No numbness in her face.   Review of Systems  Constitutional: Negative for fever, activity change, appetite change, fatigue and unexpected weight change.  HENT: Positive for dental problem. Negative for drooling.   Eyes: Negative.   Respiratory: Negative for cough, chest tightness, shortness of breath and wheezing.   Cardiovascular: Negative for chest pain, palpitations and leg swelling.  Gastrointestinal: Negative for abdominal pain, diarrhea, constipation and abdominal distention.  Musculoskeletal: Negative.   Skin: Negative.   Neurological: Negative.       Objective:   Physical Exam  Constitutional: She is oriented to person, place, and time. She appears well-developed and well-nourished.  HENT:  Head: Normocephalic and atraumatic.  Mouth/Throat: Oropharynx is clear and moist.  No tenderness to tapping on the side of the face.  Eyes: EOM are normal.  Neck: Normal range of motion.  Cardiovascular: Normal rate and regular rhythm.   Pulmonary/Chest: Effort normal and breath sounds normal. She has no wheezes.  Abdominal: Bowel sounds are normal. She exhibits no distension. There is no tenderness. There is no rebound.  Neurological: She is alert and oriented to person, place, and time. Coordination normal.  Skin: Skin is warm and dry.   Filed Vitals:   05/30/14 1349  BP: 134/76  Pulse: 86  Temp: 98.4 F (36.9 C)  TempSrc: Oral  Resp: 16  Weight: 191 lb (86.637 kg)  SpO2: 95%     Assessment & Plan:

## 2014-05-30 NOTE — Progress Notes (Signed)
Pre visit review using our clinic review tool, if applicable. No additional management support is needed unless otherwise documented below in the visit note. 

## 2014-05-30 NOTE — Patient Instructions (Signed)
We will have you take 1/2 pill of the desipramine tonight and for 2 more days. Then go to taking 1/2 pill of the desipramine for 4 days (2 doses). Then you can stop.   We have sent in cymbalta (duloxetine) and you will take 1 pill daily (once you are off the desipramine). It may take 2 weeks to get into your system so you will have it while you are still taking the lyrica. After 2 weeks we can increase you to the full dose so call us and let us know how you are doing with it.   If you decide to start taking the gabapentin:   If you have 100 mg pills: Start taking 1 pill a day for 2 days then you can increase to taking 1 pill twice a day for 2 days then can increase to 2 pills twice a day and can gradually increase to 2 pills three times a day.  If you have 300 mg pills: Start taking 1 pill a day for 2 days then you can increase to taking 1 pill twice a day for 2 days then you can increase to 1 pill 3 times a day.   Come back in 1-2 months to see how you are doing. Before you start any new medicines call us with the name so we can advise on it.

## 2014-05-30 NOTE — Assessment & Plan Note (Signed)
She has had extensive imaging for trying to diagnose this problem and seen many different providers. Will taper her off the desipramine as she is having side effects. Can try cymbalta instead since the lyrica has been partially helpful. Have advised her not to start any new medicines (there is one she has not filled yet she could not remember the name of) without advice from [place] on the interaction with other medicines. Likely no problem with the nerve since multiple imaging studies unable to show.

## 2014-05-31 ENCOUNTER — Ambulatory Visit (INDEPENDENT_AMBULATORY_CARE_PROVIDER_SITE_OTHER): Payer: Medicare Other | Admitting: Cardiovascular Disease

## 2014-05-31 ENCOUNTER — Encounter: Payer: Self-pay | Admitting: Cardiovascular Disease

## 2014-05-31 VITALS — BP 122/74 | HR 82 | Ht 61.0 in | Wt 190.0 lb

## 2014-05-31 DIAGNOSIS — I6529 Occlusion and stenosis of unspecified carotid artery: Secondary | ICD-10-CM | POA: Diagnosis not present

## 2014-05-31 DIAGNOSIS — Z0000 Encounter for general adult medical examination without abnormal findings: Secondary | ICD-10-CM | POA: Diagnosis not present

## 2014-05-31 NOTE — Patient Instructions (Signed)
Medication Instructions:  NO CHANGES  Labwork: LIPID  LIVER  SAME  DAY  AS  CAROTID  Testing/Procedures: Your physician has requested that you have a carotid duplex. This test is an ultrasound of the carotid arteries in your neck. It looks at blood flow through these arteries that supply the brain with blood. Allow one hour for this exam. There are no restrictions or special instructions.   Follow-Up: AS NEEDED  Any Other Special Instructions Will Be Listed Below (If Applicable).

## 2014-06-05 ENCOUNTER — Telehealth: Payer: Self-pay | Admitting: Gastroenterology

## 2014-06-05 NOTE — Telephone Encounter (Signed)
Spoke with the patient. Issues with constipation. Blood with bowel movements. Appointment made for evaluation.

## 2014-06-07 ENCOUNTER — Ambulatory Visit (HOSPITAL_COMMUNITY): Payer: Medicare Other | Attending: Cardiovascular Disease

## 2014-06-07 ENCOUNTER — Ambulatory Visit
Admission: RE | Admit: 2014-06-07 | Discharge: 2014-06-07 | Disposition: A | Discharge status: Home / Self Care | Payer: Medicare Other | Source: Ambulatory Visit

## 2014-06-07 ENCOUNTER — Other Ambulatory Visit (INDEPENDENT_AMBULATORY_CARE_PROVIDER_SITE_OTHER): Payer: Medicare Other | Admitting: *Deleted

## 2014-06-07 DIAGNOSIS — Z0000 Encounter for general adult medical examination without abnormal findings: Secondary | ICD-10-CM | POA: Diagnosis not present

## 2014-06-07 DIAGNOSIS — Z1231 Encounter for screening mammogram for malignant neoplasm of breast: Secondary | ICD-10-CM | POA: Diagnosis not present

## 2014-06-07 DIAGNOSIS — I6529 Occlusion and stenosis of unspecified carotid artery: Secondary | ICD-10-CM

## 2014-06-07 DIAGNOSIS — I6523 Occlusion and stenosis of bilateral carotid arteries: Secondary | ICD-10-CM | POA: Diagnosis not present

## 2014-06-07 DIAGNOSIS — I10 Essential (primary) hypertension: Secondary | ICD-10-CM | POA: Diagnosis not present

## 2014-06-07 LAB — LIPID PANEL
Cholesterol: 205 mg/dL — ABNORMAL HIGH (ref 0–200)
HDL: 46.9 mg/dL (ref >39.00)
LDL Cholesterol: 140 mg/dL — ABNORMAL HIGH (ref 0–99)
NONHDL: 158.1
Total CHOL/HDL Ratio: 4
Triglycerides: 92 mg/dL (ref 0.0–149.0)
VLDL: 18.4 mg/dL (ref 0.0–40.0)

## 2014-06-12 ENCOUNTER — Ambulatory Visit (INDEPENDENT_AMBULATORY_CARE_PROVIDER_SITE_OTHER): Payer: Medicare Other | Admitting: Gastroenterology

## 2014-06-12 ENCOUNTER — Encounter: Payer: Self-pay | Admitting: Gastroenterology

## 2014-06-12 VITALS — BP 136/88 | HR 72 | Ht 61.0 in | Wt 189.0 lb

## 2014-06-12 DIAGNOSIS — K5900 Constipation, unspecified: Secondary | ICD-10-CM | POA: Insufficient documentation

## 2014-06-12 DIAGNOSIS — I6529 Occlusion and stenosis of unspecified carotid artery: Secondary | ICD-10-CM | POA: Diagnosis not present

## 2014-06-12 DIAGNOSIS — K625 Hemorrhage of anus and rectum: Secondary | ICD-10-CM | POA: Diagnosis not present

## 2014-06-12 MED ORDER — LINACLOTIDE 145 MCG PO CAPS: 145.0000 ug | ORAL_CAPSULE | Freq: Every day | ORAL | Status: DC

## 2014-06-12 NOTE — Assessment & Plan Note (Signed)
Limited rectal bleeding is most likely hemorrhoidal.  A more proximal bleeding source should be ruled out.  Recommendations #1 colonoscopy #2 should bleeding be hemorrhoidal would consider band ligation if symptoms persist following resolution of constipation.  CC Dr. Doug Sou

## 2014-06-12 NOTE — Progress Notes (Signed)
      History of Present Illness:  [name] has returned for evaluation of rectal bleeding and constipation.  She has been on various medications for trigeminal neuralgia and has developed constipation.  She may have perhaps 2 bowel movements a week.  With constipation she is bloated has realized abdominal discomfort.  Symptoms are relieved when she moves her bowels.  She is seeing bright red blood mixed with the stools.  She has a history of hemorrhoids which have been banded in the past.  Last colonoscopy was 10 years ago where a few diverticula were seen.    Review of Systems: Pertinent positive and negative review of systems were noted in the above HPI section. All other review of systems were otherwise negative.    Current Medications, Allergies, Past Medical History, Past Surgical History, Family History and Social History were reviewed in Rhonda Henry record  Vital signs were reviewed in today's medical record. Physical Exam: General: Well developed , well nourished, no acute distress Skin: anicteric Head: Normocephalic and atraumatic Eyes:  sclerae anicteric, EOMI Ears: Normal auditory acuity Mouth: No deformity or lesions Lymph Nodes: no lymphadenopathy Lungs: Clear throughout to auscultation Heart: Regular rate and rhythm; no murmurs, rubs or brui: Gastroinestinal:  Soft, non tender and non distended. No masses, hepatosplenomegaly or hernias noted. Normal Bowel sounds Rectal:deferred Musculoskeletal: Symmetrical with no gross deformities  Pulses:  Normal pulses noted Extremities: No clubbing, cyanosis, edema or deformities noted Neurological: Alert oriented x 4, grossly nonfocal Psychological:  Alert and cooperative. Normal mood and affect  See Assessment and Plan under Problem List

## 2014-06-12 NOTE — Patient Instructions (Signed)
It has been recommended to you by your physician that you have a(n) Colonoscopy completed. Per your request, we did not schedule the procedure(s) today. Please contact our office at [phone] should you decide to have the procedure completed.  Linzess has been sent to your pharmacy We have given you a 30 day free coupon

## 2014-06-12 NOTE — Assessment & Plan Note (Signed)
Constipation may be medication-induced.  Last colonoscopy was 10 years ago.  Recommendations #1 trial of Linzess 145 g daily #2 colonoscopy

## 2014-07-01 ENCOUNTER — Other Ambulatory Visit: Payer: Self-pay

## 2014-07-02 DIAGNOSIS — M5117 Intervertebral disc disorders with radiculopathy, lumbosacral region: Secondary | ICD-10-CM | POA: Diagnosis not present

## 2014-07-02 DIAGNOSIS — M546 Pain in thoracic spine: Secondary | ICD-10-CM | POA: Diagnosis not present

## 2014-07-02 DIAGNOSIS — M4806 Spinal stenosis, lumbar region: Secondary | ICD-10-CM | POA: Diagnosis not present

## 2014-07-02 DIAGNOSIS — G894 Chronic pain syndrome: Secondary | ICD-10-CM | POA: Diagnosis not present

## 2014-07-02 DIAGNOSIS — M4727 Other spondylosis with radiculopathy, lumbosacral region: Secondary | ICD-10-CM | POA: Diagnosis not present

## 2014-07-02 DIAGNOSIS — M545 Low back pain: Secondary | ICD-10-CM | POA: Diagnosis not present

## 2014-07-04 DIAGNOSIS — M545 Low back pain: Secondary | ICD-10-CM | POA: Diagnosis not present

## 2014-07-04 DIAGNOSIS — M5117 Intervertebral disc disorders with radiculopathy, lumbosacral region: Secondary | ICD-10-CM | POA: Diagnosis not present

## 2014-07-04 DIAGNOSIS — M4806 Spinal stenosis, lumbar region: Secondary | ICD-10-CM | POA: Diagnosis not present

## 2014-07-04 DIAGNOSIS — M4727 Other spondylosis with radiculopathy, lumbosacral region: Secondary | ICD-10-CM | POA: Diagnosis not present

## 2014-07-04 DIAGNOSIS — G894 Chronic pain syndrome: Secondary | ICD-10-CM | POA: Diagnosis not present

## 2014-07-11 DIAGNOSIS — M7041 Prepatellar bursitis, right knee: Secondary | ICD-10-CM | POA: Diagnosis not present

## 2014-07-29 ENCOUNTER — Telehealth: Payer: Self-pay | Admitting: Gastroenterology

## 2014-07-29 NOTE — Telephone Encounter (Signed)
Advised the patient of the policy. She wants to go forward with this. She is not angry. She does not feel Dr Deatra Ina is a "bad doctor". She feels she was not communicating well with him.

## 2014-07-29 NOTE — Telephone Encounter (Signed)
Patient is requesting to be changed to you for her GI care.

## 2014-07-29 NOTE — Telephone Encounter (Signed)
OK with me.

## 2014-07-29 NOTE — Telephone Encounter (Signed)
Okay to switch.  Please point out to the patient that, prior to cholecystectomy, I had recommended to her that she undergo surgery because of the possibility of gallbladder pain.

## 2014-07-29 NOTE — Telephone Encounter (Signed)
Rhonda Henry, can you take it from here? She is okay to schedule with Dr Ardis Hughs. She did not say she was having an acute problem. I can help if you need me. Thanks.

## 2014-07-31 ENCOUNTER — Other Ambulatory Visit (INDEPENDENT_AMBULATORY_CARE_PROVIDER_SITE_OTHER): Payer: Medicare Other

## 2014-07-31 ENCOUNTER — Ambulatory Visit (INDEPENDENT_AMBULATORY_CARE_PROVIDER_SITE_OTHER): Payer: Medicare Other | Admitting: Internal Medicine

## 2014-07-31 ENCOUNTER — Encounter: Payer: Self-pay | Admitting: Internal Medicine

## 2014-07-31 VITALS — BP 110/64 | HR 67 | Temp 97.9°F | Resp 14 | Wt 184.0 lb

## 2014-07-31 DIAGNOSIS — R1011 Right upper quadrant pain: Secondary | ICD-10-CM

## 2014-07-31 DIAGNOSIS — I6529 Occlusion and stenosis of unspecified carotid artery: Secondary | ICD-10-CM | POA: Diagnosis not present

## 2014-07-31 DIAGNOSIS — R1013 Epigastric pain: Secondary | ICD-10-CM | POA: Diagnosis not present

## 2014-07-31 LAB — CBC WITH DIFFERENTIAL/PLATELET
BASOS ABS: 0 10*3/uL (ref 0.0–0.1)
BASOS PCT: 0.5 % (ref 0.0–3.0)
EOS PCT: 1.7 % (ref 0.0–5.0)
Eosinophils Absolute: 0.1 10*3/uL (ref 0.0–0.7)
HCT: 41.2 % (ref 36.0–46.0)
Hemoglobin: 13.8 g/dL (ref 12.0–15.0)
LYMPHS PCT: 31.2 % (ref 12.0–46.0)
Lymphs Abs: 2.6 10*3/uL (ref 0.7–4.0)
MCHC: 33.5 g/dL (ref 30.0–36.0)
MCV: 84 fl (ref 78.0–100.0)
Monocytes Absolute: 0.7 10*3/uL (ref 0.1–1.0)
Monocytes Relative: 8.1 % (ref 3.0–12.0)
NEUTROS PCT: 58.5 % (ref 43.0–77.0)
Neutro Abs: 4.8 10*3/uL (ref 1.4–7.7)
Platelets: 213 10*3/uL (ref 150.0–400.0)
RBC: 4.91 Mil/uL (ref 3.87–5.11)
RDW: 15 % (ref 11.5–15.5)
WBC: 8.3 10*3/uL (ref 4.0–10.5)

## 2014-07-31 LAB — BASIC METABOLIC PANEL
BUN: 21 mg/dL (ref 6–23)
CO2: 29 mEq/L (ref 19–32)
Calcium: 9.6 mg/dL (ref 8.4–10.5)
Chloride: 107 mEq/L (ref 96–112)
Creatinine, Ser: 0.77 mg/dL (ref 0.40–1.20)
GFR: 78.88 mL/min (ref >60.00)
Glucose, Bld: 98 mg/dL (ref 70–99)
POTASSIUM: 3.7 meq/L (ref 3.5–5.1)
Sodium: 143 mEq/L (ref 135–145)

## 2014-07-31 LAB — AMYLASE: Amylase: 36 U/L (ref 27–131)

## 2014-07-31 LAB — HEPATIC FUNCTION PANEL
ALT: 13 U/L (ref 0–35)
AST: 15 U/L (ref 0–37)
Albumin: 3.9 g/dL (ref 3.5–5.2)
Alkaline Phosphatase: 59 U/L (ref 39–117)
BILIRUBIN TOTAL: 0.9 mg/dL (ref 0.2–1.2)
Bilirubin, Direct: 0.2 mg/dL (ref 0.0–0.3)
TOTAL PROTEIN: 6.8 g/dL (ref 6.0–8.3)

## 2014-07-31 LAB — H. PYLORI ANTIBODY, IGG: H Pylori IgG: NEGATIVE

## 2014-07-31 LAB — LIPASE: Lipase: 6 U/L — ABNORMAL LOW (ref 11.0–59.0)

## 2014-07-31 MED ORDER — ESOMEPRAZOLE MAGNESIUM 40 MG PO CPDR
40.0000 mg | DELAYED_RELEASE_CAPSULE | Freq: Every day | ORAL | Status: DC
Start: 2014-07-31 — End: 2014-08-27

## 2014-07-31 NOTE — Patient Instructions (Signed)
Reflux of gastric acid may be asymptomatic as this may occur mainly during sleep.The triggers for reflux  include stress; the "aspirin family" ; alcohol; peppermint; and caffeine (coffee, tea, cola, and chocolate). The aspirin family would include aspirin and the nonsteroidal agents such as ibuprofen &  Naproxen. Tylenol would not cause reflux. If having symptoms ; food & drink should be avoided for @ least 2 hours before going to bed.    Your next office appointment will be determined based upon review of your pending labs  and  xrays  Those written interpretation of the lab results and instructions will be transmitted to you by My Chart  Critical results will be called.   Followup as needed for any active or acute issue. Please report any significant change in your symptoms.

## 2014-07-31 NOTE — Progress Notes (Signed)
   Subjective:    Patient ID: Rhonda Henry, female    DOB: 1945-03-23, 69 y.o.   MRN: [id number]  HPI   She was seen 06/12/14 by Dr. Deatra Ina for constipation and rectal bleeding. Linzess was prescribed and colonoscopy recommended. She developed right upper quadrant pain 2 weeks ago after the second dose of Linzess &  also had associated diarrheal watery stools which were constant over 3 hours. This was associated with stabbing pain. She describes some constancy to the pain with increased discomfort after eating. She stopped the Linzess after the third dose but the right upper quadrant pain has continued.  No tobacco or alcohol ; but she has used NSAIDS regularly.  She's had 2-3  Episodes of severe dyspepsia usually postprandial. She cannot identify specific foods ingested.  She had a soft bowel movement yesterday which was thin like a pencil. Colonoscopy has tentatively been scheduled in September.  She had panendoscopy 07/03/09 by Dr. Earlean Shawl; this was performed because of epigastric and right upper quadrant pain. She was found to have nonerosive antritis. Nexium twice a day was recommended.  She had a cholecystectomy in February 2015.  She has no associated genitourinary or cardiovascular symptoms. She has had some carotid plaque on carotid duplex in August 2014. There was no significant stenosis.   Review of Systems Unexplained weight loss, dysphagia, melena, rectal bleeding, or persistently small caliber stools are denied. Dysuria, pyuria, hematuria, frequency, nocturia or polyuria are denied. Chest pain, palpitations, tachycardia, exertional dyspnea, paroxysmal nocturnal dyspnea, claudication or edema are absent.      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 There is tenderness to palpation in the epigastric area and less so in the right upper quadrant. Pedal pulses are  slightly decreased but palpable. She has valgus deformity of the knees.  General appearance :adequately nourished; in no distress.  Eyes: No conjunctival inflammation or scleral icterus is present.  Oral exam:  Lips and gums are healthy appearing.There is no oropharyngeal erythema or exudate noted. Dental hygiene is good.  Heart:  Normal rate and regular rhythm. S1 and S2 normal without gallop, murmur, click, rub or other extra sounds    Lungs:Chest clear to auscultation; no wheezes, rhonchi,rales ,or rubs present.No increased work of breathing.   Abdomen: bowel sounds normal, soft and non-tender without masses, organomegaly or hernias noted.  No guarding or rebound. No flank tenderness to percussion.  Vascular : all pulses equal ; no bruits present.  Skin:Warm & dry.  Intact without suspicious lesions or rashes ; no tenting or jaundice   Lymphatic: No lymphadenopathy is noted about the head, neck, axilla   Neuro: Strength, tone & DTRs normal.         Assessment & Plan:  #1 right upper quadrant pain in the context of previous history of cholecystectomy  #2 epigastric pain in the context of significant intake of non-steroidals  See orders

## 2014-07-31 NOTE — Progress Notes (Signed)
Pre visit review using our clinic review tool, if applicable. No additional management support is needed unless otherwise documented below in the visit note. 

## 2014-08-02 ENCOUNTER — Ambulatory Visit: Payer: Medicare Other | Admitting: Internal Medicine

## 2014-08-08 DIAGNOSIS — R1011 Right upper quadrant pain: Secondary | ICD-10-CM | POA: Diagnosis not present

## 2014-08-08 DIAGNOSIS — K5900 Constipation, unspecified: Secondary | ICD-10-CM | POA: Diagnosis not present

## 2014-08-22 DIAGNOSIS — M5117 Intervertebral disc disorders with radiculopathy, lumbosacral region: Secondary | ICD-10-CM | POA: Diagnosis not present

## 2014-08-22 DIAGNOSIS — M4727 Other spondylosis with radiculopathy, lumbosacral region: Secondary | ICD-10-CM | POA: Diagnosis not present

## 2014-08-22 DIAGNOSIS — M4806 Spinal stenosis, lumbar region: Secondary | ICD-10-CM | POA: Diagnosis not present

## 2014-08-27 ENCOUNTER — Other Ambulatory Visit: Payer: Self-pay | Admitting: Internal Medicine

## 2014-08-27 ENCOUNTER — Encounter: Payer: Self-pay | Admitting: Gastroenterology

## 2014-08-27 NOTE — Telephone Encounter (Signed)
Faxed to pharmacy

## 2014-08-29 NOTE — Telephone Encounter (Signed)
Pt is sch'd for colon w/Dr. Ardis Hughs on 11-01-14

## 2014-09-13 ENCOUNTER — Other Ambulatory Visit: Payer: Self-pay | Admitting: Physical Medicine and Rehabilitation

## 2014-09-13 DIAGNOSIS — R208 Other disturbances of skin sensation: Secondary | ICD-10-CM

## 2014-09-13 DIAGNOSIS — R5381 Other malaise: Secondary | ICD-10-CM

## 2014-09-20 ENCOUNTER — Ambulatory Visit
Admission: RE | Admit: 2014-09-20 | Discharge: 2014-09-20 | Disposition: A | Discharge status: Home / Self Care | Payer: Medicare Other | Source: Ambulatory Visit | Attending: Physical Medicine and Rehabilitation | Admitting: Physical Medicine and Rehabilitation

## 2014-09-20 DIAGNOSIS — R208 Other disturbances of skin sensation: Secondary | ICD-10-CM

## 2014-09-20 DIAGNOSIS — R5381 Other malaise: Secondary | ICD-10-CM

## 2014-09-20 DIAGNOSIS — M5126 Other intervertebral disc displacement, lumbar region: Secondary | ICD-10-CM | POA: Diagnosis not present

## 2014-09-25 DIAGNOSIS — M4806 Spinal stenosis, lumbar region: Secondary | ICD-10-CM | POA: Diagnosis not present

## 2014-09-25 DIAGNOSIS — M545 Low back pain: Secondary | ICD-10-CM | POA: Diagnosis not present

## 2014-09-25 DIAGNOSIS — M5137 Other intervertebral disc degeneration, lumbosacral region: Secondary | ICD-10-CM | POA: Diagnosis not present

## 2014-09-25 DIAGNOSIS — M4727 Other spondylosis with radiculopathy, lumbosacral region: Secondary | ICD-10-CM | POA: Diagnosis not present

## 2014-09-25 DIAGNOSIS — G894 Chronic pain syndrome: Secondary | ICD-10-CM | POA: Diagnosis not present

## 2014-10-03 DIAGNOSIS — M47816 Spondylosis without myelopathy or radiculopathy, lumbar region: Secondary | ICD-10-CM | POA: Diagnosis not present

## 2014-10-03 DIAGNOSIS — M4806 Spinal stenosis, lumbar region: Secondary | ICD-10-CM | POA: Diagnosis not present

## 2014-10-09 ENCOUNTER — Other Ambulatory Visit: Payer: Self-pay | Admitting: *Deleted

## 2014-10-09 DIAGNOSIS — M48061 Spinal stenosis, lumbar region without neurogenic claudication: Secondary | ICD-10-CM

## 2014-10-14 ENCOUNTER — Ambulatory Visit: Payer: Medicare Other | Admitting: Neurology

## 2014-10-22 ENCOUNTER — Other Ambulatory Visit: Payer: Self-pay | Admitting: Internal Medicine

## 2014-10-24 ENCOUNTER — Ambulatory Visit: Payer: Medicare Other

## 2014-10-24 ENCOUNTER — Ambulatory Visit
Admission: RE | Admit: 2014-10-24 | Discharge: 2014-10-24 | Disposition: A | Discharge status: Home / Self Care | Payer: Medicare Other | Source: Ambulatory Visit | Attending: *Deleted | Admitting: *Deleted

## 2014-10-24 DIAGNOSIS — M48061 Spinal stenosis, lumbar region without neurogenic claudication: Secondary | ICD-10-CM

## 2014-10-24 DIAGNOSIS — M50223 Other cervical disc displacement at C6-C7 level: Secondary | ICD-10-CM | POA: Diagnosis not present

## 2014-10-29 DIAGNOSIS — R202 Paresthesia of skin: Secondary | ICD-10-CM | POA: Diagnosis not present

## 2014-10-29 DIAGNOSIS — M79604 Pain in right leg: Secondary | ICD-10-CM | POA: Diagnosis not present

## 2014-10-29 DIAGNOSIS — M79605 Pain in left leg: Secondary | ICD-10-CM | POA: Diagnosis not present

## 2014-10-29 DIAGNOSIS — R209 Unspecified disturbances of skin sensation: Secondary | ICD-10-CM | POA: Diagnosis not present

## 2014-10-30 DIAGNOSIS — M545 Low back pain: Secondary | ICD-10-CM | POA: Diagnosis not present

## 2014-10-30 DIAGNOSIS — M4727 Other spondylosis with radiculopathy, lumbosacral region: Secondary | ICD-10-CM | POA: Diagnosis not present

## 2014-10-30 DIAGNOSIS — M5117 Intervertebral disc disorders with radiculopathy, lumbosacral region: Secondary | ICD-10-CM | POA: Diagnosis not present

## 2014-10-30 DIAGNOSIS — G894 Chronic pain syndrome: Secondary | ICD-10-CM | POA: Diagnosis not present

## 2014-10-30 DIAGNOSIS — M5137 Other intervertebral disc degeneration, lumbosacral region: Secondary | ICD-10-CM | POA: Diagnosis not present

## 2014-10-30 DIAGNOSIS — M4806 Spinal stenosis, lumbar region: Secondary | ICD-10-CM | POA: Diagnosis not present

## 2014-10-31 ENCOUNTER — Ambulatory Visit (INDEPENDENT_AMBULATORY_CARE_PROVIDER_SITE_OTHER): Payer: Medicare Other

## 2014-10-31 DIAGNOSIS — L821 Other seborrheic keratosis: Secondary | ICD-10-CM | POA: Diagnosis not present

## 2014-10-31 DIAGNOSIS — Z23 Encounter for immunization: Secondary | ICD-10-CM

## 2014-10-31 DIAGNOSIS — D485 Neoplasm of uncertain behavior of skin: Secondary | ICD-10-CM | POA: Diagnosis not present

## 2014-10-31 DIAGNOSIS — D2371 Other benign neoplasm of skin of right lower limb, including hip: Secondary | ICD-10-CM | POA: Diagnosis not present

## 2014-10-31 DIAGNOSIS — D225 Melanocytic nevi of trunk: Secondary | ICD-10-CM | POA: Diagnosis not present

## 2014-11-01 ENCOUNTER — Encounter: Payer: Medicare Other | Admitting: Gastroenterology

## 2014-11-05 DIAGNOSIS — M79606 Pain in leg, unspecified: Secondary | ICD-10-CM | POA: Diagnosis not present

## 2014-11-07 ENCOUNTER — Ambulatory Visit (INDEPENDENT_AMBULATORY_CARE_PROVIDER_SITE_OTHER): Payer: Medicare Other | Admitting: Neurology

## 2014-11-07 ENCOUNTER — Encounter: Payer: Self-pay | Admitting: Neurology

## 2014-11-07 VITALS — BP 138/80 | HR 80 | Ht 61.0 in | Wt 191.3 lb

## 2014-11-07 DIAGNOSIS — G501 Atypical facial pain: Secondary | ICD-10-CM | POA: Diagnosis not present

## 2014-11-07 DIAGNOSIS — I6529 Occlusion and stenosis of unspecified carotid artery: Secondary | ICD-10-CM | POA: Diagnosis not present

## 2014-11-07 DIAGNOSIS — M1712 Unilateral primary osteoarthritis, left knee: Secondary | ICD-10-CM | POA: Diagnosis not present

## 2014-11-07 DIAGNOSIS — R202 Paresthesia of skin: Secondary | ICD-10-CM

## 2014-11-07 DIAGNOSIS — R209 Unspecified disturbances of skin sensation: Secondary | ICD-10-CM | POA: Diagnosis not present

## 2014-11-07 LAB — TSH: TSH: 1.508 u[IU]/mL (ref 0.350–4.500)

## 2014-11-07 LAB — FERRITIN: FERRITIN: 94 ng/mL (ref 10–291)

## 2014-11-07 LAB — VITAMIN B12: Vitamin B-12: 1063 pg/mL — ABNORMAL HIGH (ref 211–911)

## 2014-11-07 NOTE — Patient Instructions (Addendum)
1.  Stop your evening Lyrica.  Continue Lyrica 50mg  and start gabapentin 300mg  at bedtime for two days.  After two days, stop Lyrica 300mg  at bedtime. 2.  Check blood work 3.  Return to clinic as needed

## 2014-11-07 NOTE — Progress Notes (Signed)
Follow-up Visit   Date: 11/07/2014   Rhonda Henry MRN: [id number] DOB: Jan 10, 1945   Interim History: Rhonda Henry is a 69 y.o.  left-handed Caucasian female with hypertension, chronic back pain, depression returning to the clinic with new complains of generalized paresthesis.  The patient was accompanied to the clinic by self.  History of present illness: She went to a dentist in October 2015 and was told she had a cavity. She had an novocain injection and developed severe, excruciating pain. She had severe pain that last several days and went back to see her dentist a few days later who adjusted her teeth, however there was no change in pain. She saw an endodontist who recommended flexeril for possible TMJ. A few days later, she was given a Medrol dose pack which relieved her pain while she was taking it. She also underwent re-root canal thinking this was the cause of her pain, but again, no improvement. She had a second opinion by another dentist who felt that her symptoms did not need a root canal.   She eventually saw her pain specialist (established patient for low back pain), who offered her toradol, but this irritated her stomach. Pain has slowly improved with lidocaine patches. Now, it feels like a bad tooth ache. Pain is worse at the end of the day, especially after talking or eating. Ice or cold beverages help her pain.   She had face lift ~2010 and has always had residual mild asymmetry of the face with the right higher than the lift.   UPDATE 11/07/2014:  She [facility] who started her on Lyrica 75mg  BID which helped her pain.  She tried tapering off Lyrica and then began having sensation of water running down her legs, crawling sensation of her arms and legs.  It is improved by walking around, but sometimes even when walking, she can have zings of sensation.  She has been seeing Dr. Ace Gins for her low back pain and had an injection  which helped a little at L5 only. Her pain wakes her up at night and is reduced by valium.  Stress does makes the symptoms worse.   She went back to Lyrica 50mg  twice daily about a month ago, which seems to have helped somewhat because she no longer has sensation over her torso.    She was evaluated at [facility] neurosurgery whose evaluation including NCS/EMG was normal.  She was told to follow-up with neurology.   Medications:  Current Outpatient Prescriptions on File Prior to Visit  Medication Sig Dispense Refill  . clindamycin (CLEOCIN) 150 MG capsule Take 150 mg by mouth. Take after sex    . diazepam (VALIUM) 5 MG tablet TAKE 1 TABLET BY MOUTH EVERY 12 HOURS AS NEEDED FOR ANXIETY 30 tablet 3  . EPINEPHrine (EPIPEN 2-PAK) 0.3 mg/0.3 mL IJ SOAJ injection Inject 0.3 mLs (0.3 mg total) into the muscle once. 1 Device 3  . esomeprazole (NEXIUM) 40 MG capsule TAKE 1 CAPSULE BY MOUTH EVERY DAY (Patient taking differently: 1/2 tablet daily) 30 capsule 11  . ibuprofen (ADVIL,MOTRIN) 600 MG tablet Take 600 mg by mouth every 6 (six) hours as needed.     Marland Kitchen losartan (COZAAR) 50 MG tablet Take 1 tablet (50 mg total) by mouth daily. 90 tablet 3  . pregabalin (LYRICA) 75 MG capsule Take 50 mg by mouth 2 (two) times daily.     . Wheat Dextrin (BENEFIBER) POWD Take  by mouth daily.     No current facility-administered medications on file prior to visit.    Allergies:  Allergies  Allergen Reactions  . Penicillin G Hives  . Penicillins Hives    No reaction listed.   . Desipramine Hcl Rash  . Tape Hives    Review of Systems:  CONSTITUTIONAL: No fevers, chills, night sweats, or weight loss.  EYES: No visual changes or eye pain ENT: No hearing changes.  No history of nose bleeds.   RESPIRATORY: No cough, wheezing and shortness of breath.   CARDIOVASCULAR: Negative for chest pain, and palpitations.   GI: Negative for abdominal discomfort, blood in stools or black stools.  No recent change in  bowel habits.   GU:  No history of incontinence.   MUSCLOSKELETAL: No history of joint pain or swelling.  No myalgias.   SKIN: Negative for lesions, rash, and itching.   ENDOCRINE: Negative for cold or heat intolerance, polydipsia or goiter.   PSYCH:  No depression + anxiety symptoms.   NEURO: As Above.   Vital Signs:  BP 138/80 mmHg  Pulse 80  Ht 5\' 1"  (1.549 m)  Wt 191 lb 5 oz (86.779 kg)  BMI 36.17 kg/m2  SpO2 97%  General: Well appearing Neck: no carotid bruit CV: regular rate and rhythm  Neurological Exam: MENTAL STATUS including orientation to time, place, person, recent and remote memory, attention span and concentration, language, and fund of knowledge is normal.  Speech is not dysarthric.  CRANIAL NERVES:  No abnormalities on fundoscopic exam.  No visual field defects.  Pupils equal round and reactive to light.  Normal conjugate, extra-ocular eye movements in all directions of gaze.  No ptosis. Normal facial sensation.  Face is symmetric. Palate elevates symmetrically.  Tongue is midline.  MOTOR:  Motor strength is 5/5 in all extremities.  No atrophy, fasciculations or abnormal movements.  No pronator drift.  Tone is normal.    MSRs:  Reflexes are 2+/4 throughout, except 1+ in the lower extremities.  SENSORY:  Mildly reduced vibration, temperature, and pin prick over the left foot.  COORDINATION/GAIT:  Normal finger-to- nose-finger and heel-to-shin.  Intact rapid alternating movements bilaterally.  Gait narrow based and stable.  Tandem gait intact.  Data: MRI face 01/07/2014: No central finding. No definite peripheral pathologic finding. 9 mm focus of signal within the posterior body of the mandible on the left that could possibly relate to V3. See above discussion.  MRI cervical spine wo contrast 10/24/2014: Slight deformity of the ventral cord and mild bilateral foraminal narrowing at C5-6 due to a disc osteophyte complex and uncovertebral disease.  Uncovertebral  disease causes mild to moderate left foraminal narrowing at C4-5.  Shallow right paracentral protrusion at C6-7 contacts the cord without deforming it.  Lab Results  Component Value Date   [id number] 04/29/2009   Lab Results  Component Value Date   TSH 2.195 08/09/2012   EMG of the left upper and lower extremity 10/29/2014:  Normal   IMPRESSION: Generalized nonspecific paresthesias vs RLS.  Exam is relatively intact except for mild reduced sensation over the left foot and reduced reflexes in the legs.  NCS/EMG performed at [facility] of the left side was normal and did not disclose neuropathy. I explained that with her variable and nonanatomical nature of symptoms, these may be related to anxiety, metabolic derangements, or restless leg syndrome.  Atypical facial pain - improved  PLAN/RECOMMENDATIONS:  1.  Check vitamin B12, ferritin, TSH, vitamin D, copper,  zinc 2.  She wishes to come off Lyrica, so will ask her to reduce to 1 tablet in the morning for two days, the stop.  While doing so, she may take gapapentin 300mg  at bedtime and see if this provides similar relief.  3.  Return to clinic as needed   The duration of this appointment visit was 35 minutes of face-to-face time with the patient.  Greater than 50% of this time was spent in counseling, explanation of diagnosis, planning of further management, and coordination of care.   Thank you for allowing me to participate in patient's care.  If I can answer any additional questions, I would be pleased to do so.    Sincerely,    Donika K. Posey Pronto, DO

## 2014-11-08 LAB — VITAMIN D 25 HYDROXY (VIT D DEFICIENCY, FRACTURES): Vit D, 25-Hydroxy: 27 ng/mL — ABNORMAL LOW (ref 30–100)

## 2014-11-10 LAB — COPPER, SERUM: COPPER: 93 ug/dL (ref 70–175)

## 2014-11-10 LAB — ZINC: ZINC: 44 ug/dL — AB (ref 60–130)

## 2014-11-19 ENCOUNTER — Encounter: Payer: Self-pay | Admitting: *Deleted

## 2014-11-27 ENCOUNTER — Ambulatory Visit (AMBULATORY_SURGERY_CENTER): Payer: Self-pay | Admitting: *Deleted

## 2014-11-27 ENCOUNTER — Encounter (HOSPITAL_COMMUNITY): Payer: Self-pay | Admitting: Emergency Medicine

## 2014-11-27 ENCOUNTER — Emergency Department (HOSPITAL_COMMUNITY)
Admission: EM | Admit: 2014-11-27 | Discharge: 2014-11-27 | Disposition: A | Discharge status: Home / Self Care | Payer: Medicare Other | Attending: Emergency Medicine | Admitting: Emergency Medicine

## 2014-11-27 ENCOUNTER — Emergency Department (EMERGENCY_DEPARTMENT_HOSPITAL): Payer: Medicare Other

## 2014-11-27 VITALS — Ht 61.0 in | Wt 187.8 lb

## 2014-11-27 DIAGNOSIS — F419 Anxiety disorder, unspecified: Secondary | ICD-10-CM | POA: Insufficient documentation

## 2014-11-27 DIAGNOSIS — Z792 Long term (current) use of antibiotics: Secondary | ICD-10-CM | POA: Insufficient documentation

## 2014-11-27 DIAGNOSIS — R2242 Localized swelling, mass and lump, left lower limb: Secondary | ICD-10-CM | POA: Diagnosis not present

## 2014-11-27 DIAGNOSIS — Z79899 Other long term (current) drug therapy: Secondary | ICD-10-CM | POA: Diagnosis not present

## 2014-11-27 DIAGNOSIS — M7989 Other specified soft tissue disorders: Secondary | ICD-10-CM

## 2014-11-27 DIAGNOSIS — Z87440 Personal history of urinary (tract) infections: Secondary | ICD-10-CM | POA: Insufficient documentation

## 2014-11-27 DIAGNOSIS — Z8669 Personal history of other diseases of the nervous system and sense organs: Secondary | ICD-10-CM | POA: Insufficient documentation

## 2014-11-27 DIAGNOSIS — M1990 Unspecified osteoarthritis, unspecified site: Secondary | ICD-10-CM | POA: Diagnosis not present

## 2014-11-27 DIAGNOSIS — M79605 Pain in left leg: Secondary | ICD-10-CM | POA: Diagnosis present

## 2014-11-27 DIAGNOSIS — K5900 Constipation, unspecified: Secondary | ICD-10-CM | POA: Insufficient documentation

## 2014-11-27 DIAGNOSIS — F329 Major depressive disorder, single episode, unspecified: Secondary | ICD-10-CM | POA: Diagnosis not present

## 2014-11-27 DIAGNOSIS — Z880 Allergy status to penicillin: Secondary | ICD-10-CM | POA: Insufficient documentation

## 2014-11-27 DIAGNOSIS — Z1211 Encounter for screening for malignant neoplasm of colon: Secondary | ICD-10-CM

## 2014-11-27 DIAGNOSIS — M7122 Synovial cyst of popliteal space [Baker], left knee: Secondary | ICD-10-CM | POA: Insufficient documentation

## 2014-11-27 LAB — COMPREHENSIVE METABOLIC PANEL
ALT: 30 U/L (ref 14–54)
ANION GAP: 7 (ref 5–15)
AST: 28 U/L (ref 15–41)
Albumin: 3.9 g/dL (ref 3.5–5.0)
Alkaline Phosphatase: 63 U/L (ref 38–126)
BILIRUBIN TOTAL: 0.7 mg/dL (ref 0.3–1.2)
BUN: 18 mg/dL (ref 6–20)
CHLORIDE: 104 mmol/L (ref 101–111)
CO2: 29 mmol/L (ref 22–32)
Calcium: 9.4 mg/dL (ref 8.9–10.3)
Creatinine, Ser: 0.81 mg/dL (ref 0.44–1.00)
Glucose, Bld: 98 mg/dL (ref 65–99)
POTASSIUM: 3.8 mmol/L (ref 3.5–5.1)
Sodium: 140 mmol/L (ref 135–145)
TOTAL PROTEIN: 6.5 g/dL (ref 6.5–8.1)

## 2014-11-27 LAB — CBC WITH DIFFERENTIAL/PLATELET
BASOS ABS: 0 10*3/uL (ref 0.0–0.1)
Basophils Relative: 0 %
EOS PCT: 2 %
Eosinophils Absolute: 0.2 10*3/uL (ref 0.0–0.7)
HEMATOCRIT: 43.1 % (ref 36.0–46.0)
Hemoglobin: 13.8 g/dL (ref 12.0–15.0)
LYMPHS PCT: 22 %
Lymphs Abs: 2.2 10*3/uL (ref 0.7–4.0)
MCH: 28.1 pg (ref 26.0–34.0)
MCHC: 32 g/dL (ref 30.0–36.0)
MCV: 87.8 fL (ref 78.0–100.0)
MONO ABS: 0.7 10*3/uL (ref 0.1–1.0)
MONOS PCT: 7 %
NEUTROS ABS: 7 10*3/uL (ref 1.7–7.7)
Neutrophils Relative %: 69 %
PLATELETS: 234 10*3/uL (ref 150–400)
RBC: 4.91 MIL/uL (ref 3.87–5.11)
RDW: 14 % (ref 11.5–15.5)
WBC: 10 10*3/uL (ref 4.0–10.5)

## 2014-11-27 MED ORDER — NAPROXEN 500 MG PO TABS: 500.0000 mg | ORAL_TABLET | Freq: Two times a day (BID) | ORAL | Status: DC

## 2014-11-27 NOTE — Progress Notes (Signed)
VASCULAR LAB PRELIMINARY  PRELIMINARY  PRELIMINARY  PRELIMINARY  Left lower extremity venous duplex completed.    Preliminary report:  Left:  No evidence of DVT or superficial thrombosis.  Ruptured Baker's cyst noted in the left popliteal fossa and proximal calf.  Aydrien Froman, RVT 11/27/2014, 4:35 PM

## 2014-11-27 NOTE — ED Notes (Signed)
Per pt, states just arrived home from 14 hour flight-noticed left leg swollen yesterday-left groin pain down the back of left calf-states cant lift leg and foot is cold-no respiratory distrss

## 2014-11-27 NOTE — ED Notes (Signed)
Vascular US at bedside.

## 2014-11-27 NOTE — ED Provider Notes (Signed)
CSN: [id number]     Arrival date & time 11/27/14  1137 History   First MD Initiated Contact with Patient 11/27/14 1152     Chief Complaint  Patient presents with  . Leg Swelling     (Consider location/radiation/quality/duration/timing/severity/associated sxs/prior Treatment) Patient is a 69 y.o. female presenting with leg pain. The history is provided by the patient (Patient complains of swelling and pain to left leg. Patient stated that she was on a long plane trip yesterday in the leg started swelling.).  Leg Pain Location:  Leg Leg location:  L leg Pain details:    Quality:  Aching   Radiates to:  Does not radiate   Severity:  Moderate   Onset quality:  Sudden Associated symptoms: no back pain and no fatigue     Past Medical History  Diagnosis Date  . Arthritis   . Complication of anesthesia     "I shake like crazy"  . Recurrent UTI   . Microscopic hematuria      x 30 yrs   . Anxiety   . Difficulty sleeping     no issues now  . Abdominal pain, acute, right upper quadrant     had gall bladder removed and now no issues   . Bloating   . Constipation     not now  . Nausea   . Depression     no issues currently   . Neuromuscular disorder ([place])   . Nerve damage     face -1 yr ago and now almost resolved    Past Surgical History  Procedure Laterality Date  . Facial cosmetic surgery  7-10    compl by thick scars and a scar necrosis on R cheek  . Breast biopsies    . Breast lumpectomy      x 2  . Refractive surgery  1997  . Lumbar epidural injection  Jan 15   . Cyst excision      removed from back and left arm  . C sections      x2  . Cholecystectomy N/A 03/13/2013    Procedure: LAPAROSCOPIC CHOLECYSTECTOMY WITH INTRAOPERATIVE CHOLANGIOGRAM;  Surgeon: [name]. [name], MD;  Location: [facility] ORS;  Service: General;  Laterality: N/A;   Family History  Problem Relation Age of Onset  . Hypertension Other   . Thyroid cancer Mother   . Heart disease Mother   .  Macular degeneration Mother   . Glaucoma Father   . Colon cancer Neg Hx    Social History  Substance Use Topics  . Smoking status: Never Smoker   . Smokeless tobacco: Never Used  . Alcohol Use: 0.0 oz/week    0 Standard drinks or equivalent per week     Comment: socially   OB History    Gravida Para Term Preterm AB TAB SAB Ectopic Multiple Living   3 2   1           Review of Systems  Constitutional: Negative for appetite change and fatigue.  HENT: Negative for congestion, ear discharge and sinus pressure.   Eyes: Negative for discharge.  Respiratory: Negative for cough.   Cardiovascular: Negative for chest pain.  Gastrointestinal: Negative for abdominal pain and diarrhea.  Genitourinary: Negative for frequency and hematuria.  Musculoskeletal: Negative for back pain.       Swollen left leg  Skin: Negative for rash.  Neurological: Negative for seizures and headaches.  Psychiatric/Behavioral: Negative for hallucinations.      Allergies  Penicillin g;  Desipramine hcl; Penicillins; and Tape  Home Medications   Prior to Admission medications   Medication Sig Start Date End Date Taking? Authorizing Provider  Ascorbic Acid (VITAMIN C) 1000 MG tablet Take 2,000 mg by mouth daily.   Yes Historical Provider, MD  clindamycin (CLEOCIN) 150 MG capsule Take 150 mg by mouth. Take after sex 11/07/13  Yes Historical Provider, MD  Cyanocobalamin (VITAMIN B-12) 2500 MCG SUBL Place under the tongue.   Yes Historical Provider, MD  diazepam (VALIUM) 5 MG tablet TAKE 1 TABLET BY MOUTH EVERY 12 HOURS AS NEEDED FOR ANXIETY 10/22/14  Yes Hoyt Koch, MD  diclofenac sodium (VOLTAREN) 1 % GEL Apply 1 application topically daily as needed. 09/08/14  Yes Historical Provider, MD  EPINEPHrine (EPIPEN 2-PAK) 0.3 mg/0.3 mL IJ SOAJ injection Inject 0.3 mLs (0.3 mg total) into the muscle once. 05/17/14  Yes Gay Filler Copland, MD  esomeprazole (NEXIUM) 20 MG capsule Take 20 mg by mouth daily at 12  noon.   Yes Historical Provider, MD  ibuprofen (ADVIL,MOTRIN) 200 MG tablet Take 600 mg by mouth every 6 (six) hours as needed for headache or moderate pain.   Yes Historical Provider, MD  losartan (COZAAR) 50 MG tablet Take 1 tablet (50 mg total) by mouth daily. 01/07/14  Yes Hoyt Koch, MD  triamterene-hydrochlorothiazide (MAXZIDE-25) 37.5-25 MG tablet Take 1 tablet by mouth daily.   Yes Historical Provider, MD  Vitamin D, Cholecalciferol, 1000 UNITS CAPS Take by mouth.   Yes Historical Provider, MD  Wheat Dextrin (BENEFIBER) POWD Take by mouth daily as needed (constipation).    Yes Historical Provider, MD  esomeprazole (NEXIUM) 40 MG capsule TAKE 1 CAPSULE BY MOUTH EVERY DAY Patient taking differently: 1/2 tablet daily 08/27/14   Hoyt Koch, MD  naproxen (NAPROSYN) 500 MG tablet Take 1 tablet (500 mg total) by mouth 2 (two) times daily. 11/27/14   Milton Ferguson, MD   BP 175/96 mmHg  Pulse 91  Temp(Src) 97.7 F (36.5 C) (Oral)  Resp 18  SpO2 97% Physical Exam  Constitutional: She is oriented to person, place, and time. She appears well-developed.  HENT:  Head: Normocephalic.  Eyes: Conjunctivae and EOM are normal. No scleral icterus.  Neck: Neck supple. No thyromegaly present.  Cardiovascular: Normal rate and regular rhythm.  Exam reveals no gallop and no friction rub.   No murmur heard. Pulmonary/Chest: No stridor. She has no wheezes. She has no rales. She exhibits no tenderness.  Abdominal: She exhibits no distension. There is no tenderness. There is no rebound.  Musculoskeletal: Normal range of motion.  Left thigh swollen. With tenderness neurovascular normal  Lymphadenopathy:    She has no cervical adenopathy.  Neurological: She is oriented to person, place, and time. She exhibits normal muscle tone. Coordination normal.  Skin: No rash noted. No erythema.  Psychiatric: She has a normal mood and affect. Her behavior is normal.    ED Course  Procedures  (including critical care time) Labs Review Labs Reviewed  CBC WITH DIFFERENTIAL/PLATELET  COMPREHENSIVE METABOLIC PANEL    Imaging Review No results found. I have personally reviewed and evaluated these images and lab results as part of my medical decision-making.   EKG Interpretation None      MDM   Final diagnoses:  Swollen leg      Doppler ultrasound showed ruptured Baker's cyst. Patient was sent home with Naprosyn and told to elevate leg and follow-up with her M.D.  Milton Ferguson, MD 11/27/14 [phone]

## 2014-11-27 NOTE — Progress Notes (Signed)
Pt states she just returned from [place] and has severe leg pain to the left leg with edema and cold sensation to foot.  Instructed pt she needs to go to ED. She states she is not going. She states she tried her PCP this am and they said they were booked.  Called Dr Ardis Hughs, who came into [facility] to see Pt. He looked at her leg, she told him what was happening and he sent her straight to the ER to be evaluated for a blood clot. Colon and PV to be cancelled per Dr Ardis Hughs. Pt given number to call next week to RS after this issue evaluated. Pt hesitant to go to ED but after Dr Ardis Hughs explained what could happen if this is not taken care of, pt agreed to go to [facility] ED. Colon cancelled for 12-7

## 2014-11-27 NOTE — Discharge Instructions (Signed)
Follow up with your ortho md if not improving.  Keep leg elevaed

## 2014-12-05 ENCOUNTER — Encounter: Payer: Self-pay | Admitting: *Deleted

## 2014-12-06 ENCOUNTER — Ambulatory Visit (INDEPENDENT_AMBULATORY_CARE_PROVIDER_SITE_OTHER): Payer: Medicare Other | Admitting: Diagnostic Neuroimaging

## 2014-12-06 ENCOUNTER — Encounter: Payer: Self-pay | Admitting: Diagnostic Neuroimaging

## 2014-12-06 VITALS — BP 137/82 | HR 69 | Ht 61.0 in | Wt 191.0 lb

## 2014-12-06 DIAGNOSIS — M25561 Pain in right knee: Secondary | ICD-10-CM

## 2014-12-06 DIAGNOSIS — I6529 Occlusion and stenosis of unspecified carotid artery: Secondary | ICD-10-CM | POA: Diagnosis not present

## 2014-12-06 DIAGNOSIS — M25562 Pain in left knee: Secondary | ICD-10-CM | POA: Diagnosis not present

## 2014-12-06 MED ORDER — DULOXETINE HCL 20 MG PO CPEP: 20.0000 mg | ORAL_CAPSULE | Freq: Every day | ORAL | Status: DC

## 2014-12-06 NOTE — Progress Notes (Signed)
[facility]  PATIENT: Rhonda Henry DOB: [birth year]  REFERRING CLINICIAN: Dumonski HISTORY FROM: patient  REASON FOR VISIT: new consult    HISTORICAL  CHIEF COMPLAINT:  No chief complaint on file.   HISTORY OF PRESENT ILLNESS:   69 year old left-handed female here for evaluation of back pain and lower extremity pain. Patient has history of hypertension, acid reflux, right knee surgery.  Around 2005 patient began having problems in the legs with cramps at night. She was evaluated by neurosurgery and then ultimately managed by Dr. Ace Gins pain management with lower lumbar spinal injections. This seemed to relieve her cramps and pain in the legs. She had these injections periodically over the past 11 years.  More recently patient has had increasing cramps and pain which have not been relieved by epidural steroid injections. She is also developed burning sensation on the bottom of her left foot. Symptoms are worse with activity. Sometimes she has a "water sensation" radiating down her legs, groin region and calf region. Patient has been evaluated by multiple specialists including [facility], where an EMG nerve conduction was performed and apparently normal, as well as by orthopedic surgery Dr. Lynann Bologna, who found no specific orthopedic issues.  In the past patient has tried Lyrica and Tegretol for pain relief. Currently she is only taking as needed over-the-counter medication for pain relief.   REVIEW OF SYSTEMS: Full 14 system review of systems performed and notable only for anxiety numbness weakness joint pain swelling in legs fatigue moles.   ALLERGIES: Allergies  Allergen Reactions  . Penicillin G Hives  . Desipramine Hcl Rash  . Penicillins Hives and Rash     Has patient had a PCN reaction causing immediate rash, facial/tongue/throat swelling, SOB or lightheadedness with hypotension: Yes Has patient had a PCN reaction causing severe rash involving  mucus membranes or skin necrosis: Yes Has patient had a PCN reaction that required hospitalization No Has patient had a PCN reaction occurring within the last 10 years: No If all of the above answers are "NO", then may proceed with Cephalosporin use.   . Tape Hives    HOME MEDICATIONS: Outpatient Prescriptions Prior to Visit  Medication Sig Dispense Refill  . Ascorbic Acid (VITAMIN C) 1000 MG tablet Take 2,000 mg by mouth daily.    . clindamycin (CLEOCIN) 150 MG capsule Take 150 mg by mouth as needed. Take after sex    . diazepam (VALIUM) 5 MG tablet TAKE 1 TABLET BY MOUTH EVERY 12 HOURS AS NEEDED FOR ANXIETY 30 tablet 3  . diclofenac sodium (VOLTAREN) 1 % GEL Apply 1 application topically daily as needed.  1  . EPINEPHrine (EPIPEN 2-PAK) 0.3 mg/0.3 mL IJ SOAJ injection Inject 0.3 mLs (0.3 mg total) into the muscle once. 1 Device 3  . esomeprazole (NEXIUM) 20 MG capsule Take 20 mg by mouth daily at 12 noon.    Marland Kitchen ibuprofen (ADVIL,MOTRIN) 200 MG tablet Take 600 mg by mouth every 6 (six) hours as needed for headache or moderate pain.    Marland Kitchen losartan (COZAAR) 50 MG tablet Take 1 tablet (50 mg total) by mouth daily. 90 tablet 3  . naproxen (NAPROSYN) 500 MG tablet Take 1 tablet (500 mg total) by mouth 2 (two) times daily. 30 tablet 0  . Cyanocobalamin (VITAMIN B-12) 2500 MCG SUBL Take by mouth.     . esomeprazole (NEXIUM) 40 MG capsule TAKE 1 CAPSULE BY MOUTH EVERY DAY (Patient taking differently: 1/2 tablet daily) 30 capsule 11  . triamterene-hydrochlorothiazide (  MAXZIDE-25) 37.5-25 MG tablet Take 1 tablet by mouth daily.    . Vitamin D, Cholecalciferol, 1000 UNITS CAPS Take by mouth.    . Wheat Dextrin (BENEFIBER) POWD Take by mouth daily as needed (constipation).      No facility-administered medications prior to visit.    PAST MEDICAL HISTORY: Past Medical History  Diagnosis Date  . Arthritis   . Complication of anesthesia     "I shake like crazy"  . Recurrent UTI   . Microscopic  hematuria      x 30 yrs   . Anxiety   . Difficulty sleeping     no issues now  . Abdominal pain, acute, right upper quadrant     had gall bladder removed and now no issues   . Bloating   . Constipation     not now  . Nausea   . Depression     no issues currently   . Neuromuscular disorder ([place])   . Nerve damage     face -1 yr ago and now almost resolved     PAST SURGICAL HISTORY: Past Surgical History  Procedure Laterality Date  . Facial cosmetic surgery  7-10    compl by thick scars and a scar necrosis on R cheek  . Breast biopsies    . Breast lumpectomy      x 2  . Refractive surgery  1997  . Lumbar epidural injection  Jan 15   . Cyst excision      removed from back and left arm  . C sections      x2  . Cholecystectomy N/A 03/13/2013    Procedure: LAPAROSCOPIC CHOLECYSTECTOMY WITH INTRAOPERATIVE CHOLANGIOGRAM;  Surgeon: [name]. [name], MD;  Location: [facility];  Service: General;  Laterality: N/A;    FAMILY HISTORY: Family History  Problem Relation Age of Onset  . Hypertension Other   . Thyroid cancer Mother   . Heart disease Mother   . Macular degeneration Mother   . Stroke Mother   . Hypertension Mother   . Glaucoma Father   . Colon cancer Neg Hx     SOCIAL HISTORY:  Social History   Social History  . Marital Status: Married    Spouse Name: Herbie Baltimore  . Number of Children: 2  . Years of Education: 16   Occupational History  . Nurse, learning disability     owns own business   Social History Main Topics  . Smoking status: Never Smoker   . Smokeless tobacco: Never Used  . Alcohol Use: 0.0 oz/week    0 Standard drinks or equivalent per week     Comment: socially 1-2 glasses weekly  . Drug Use: No  . Sexual Activity: Yes    Birth Control/ Protection: Other-see comments   Other Topics Concern  . Not on file   Social History Narrative   Lives with husband in a 2 story home.  Has 2 grown children.  Owns her own business.  Education: Forensic psychologist.       PHYSICAL EXAM  GENERAL EXAM/CONSTITUTIONAL: Vitals:  Filed Vitals:   12/06/14 0844  BP: 137/82  Pulse: 69  Height: 5\' 1"  (1.549 m)  Weight: 191 lb (86.637 kg)     Body mass index is 36.11 kg/(m^2).  Visual Acuity Screening   Right eye Left eye Both eyes  Without correction:     With correction: 20/40 20/40      Patient is in no distress; well developed, nourished and groomed; neck  is supple  CARDIOVASCULAR:  Examination of carotid arteries is normal; no carotid bruits  Regular rate and rhythm, no murmurs  Examination of peripheral vascular system by observation and palpation is normal  EYES:  Ophthalmoscopic exam of optic discs and posterior segments is normal; no papilledema or hemorrhages  MUSCULOSKELETAL:  Gait, strength, tone, movements noted in Neurologic exam below  NEUROLOGIC: MENTAL STATUS:  No flowsheet data found.  awake, alert, oriented to person, place and time  recent and remote memory intact  normal attention and concentration  language fluent, comprehension intact, naming intact,   fund of knowledge appropriate  CRANIAL NERVE:   2nd - no papilledema on fundoscopic exam  2nd, 3rd, 4th, 6th - pupils equal and reactive to light, visual fields full to confrontation, extraocular muscles intact, no nystagmus  5th - facial sensation symmetric  7th - facial strength symmetric  8th - hearing intact  9th - palate elevates symmetrically, uvula midline  11th - shoulder shrug symmetric  12th - tongue protrusion midline  MOTOR:   normal bulk and tone, full strength in the BUE, BLE  SENSORY:   normal and symmetric to light touch, temperature, vibration; HYPERSENS IN FEET TO PP  COORDINATION:   finger-nose-finger, fine finger movements normal  REFLEXES:   deep tendon reflexes TRACE; ABSENT AT ANKLES  GAIT/STATION:   narrow based gait; able to walk on toes, heels; romberg is negative    DIAGNOSTIC DATA (LABS, IMAGING,  TESTING) - I reviewed patient records, labs, notes, testing and imaging myself where available.  Lab Results  Component Value Date   WBC 10.0 11/27/2014   HGB 13.8 11/27/2014   HCT 43.1 11/27/2014   MCV 87.8 11/27/2014   PLT 234 11/27/2014      Component Value Date/Time   NA 140 11/27/2014 1251   K 3.8 11/27/2014 1251   CL 104 11/27/2014 1251   CO2 29 11/27/2014 1251   GLUCOSE 98 11/27/2014 1251   BUN 18 11/27/2014 1251   CREATININE 0.81 11/27/2014 1251   CREATININE 0.72 10/09/2013 1108   CALCIUM 9.4 11/27/2014 1251   PROT 6.5 11/27/2014 1251   ALBUMIN 3.9 11/27/2014 1251   AST 28 11/27/2014 1251   ALT 30 11/27/2014 1251   ALKPHOS 63 11/27/2014 1251   BILITOT 0.7 11/27/2014 1251   GFRNONAA >60 11/27/2014 1251   GFRNONAA 86 10/09/2013 1108   GFRAA >60 11/27/2014 1251   GFRAA >89 10/09/2013 1108   Lab Results  Component Value Date   CHOL 205* 06/07/2014   HDL 46.90 06/07/2014   LDLCALC 140* 06/07/2014   TRIG 92.0 06/07/2014   CHOLHDL 4 06/07/2014   Lab Results  Component Value Date   HGBA1C 5.3 02/15/2006   Lab Results  Component Value Date   VITAMINB12 1063* 11/07/2014   Lab Results  Component Value Date   TSH 1.508 11/07/2014    09/20/14 MRI lumbar [I reviewed images myself and agree with interpretation. -VRP]  - Multilevel spondylosis as described. Slight progression of subarticular zone narrowing at L4-5, LEFT greater than RIGHT, related to annular bulging and posterior element hypertrophy.   10/24/14 MRI cervical spine [I reviewed images myself and agree with interpretation. -VRP]  - Slight deformity of the ventral cord and mild bilateral foraminal narrowing at C5-6 due to a disc osteophyte complex and uncovertebral disease.  - Uncovertebral disease causes mild to moderate left foraminal narrowing at C4-5. - Shallow right paracentral protrusion at C6-7 contacts the cord without deforming it.    ASSESSMENT  AND PLAN  69 y.o. year old female here  with chronic low back pain, lumbar radiculopathy symptoms, will managed with epidural steroid injections over the past 11 years, now with increasing pain in the legs with burning of the left foot. MRI of the cervical and lumbar spine reviewed showing mild degenerative changes without specific focal pathology or nerve impingements or surgically treatable issues. Screening test for neuropathy have been performed including B12, glucose, copper, thyroid function, as well as EMG nerve conduction study which have been unremarkable. We'll check lab testing to evaluate for myopathy. We'll try empiric trial of Cymbalta for pain control.   Ddx: neuropathy, myopathy, arthritis, musculoskeletal, lumbar radiculopathy  Dx: Arthralgia of both lower legs - Plan: CK, Aldolase   PLAN: - check CK, aldolase  Orders Placed This Encounter  Procedures  . CK  . Aldolase   Meds ordered this encounter  Medications  . DULoxetine (CYMBALTA) 20 MG capsule    Sig: Take 1 capsule (20 mg total) by mouth daily.    Dispense:  30 capsule    Refill:  12   Return in about 3 months (around 03/06/2015).    Penni Bombard, MD [id number], [id number] AM Certified in Neurology, Neurophysiology and Neuroimaging  [facility] [address] [place], [place] [address] [phone]

## 2014-12-06 NOTE — Patient Instructions (Signed)
Thank you for coming to see [place] at [facility]. I hope we have been able to provide you high quality care today.  You may receive a patient satisfaction survey over the next few weeks. We would appreciate your feedback and comments so that we may continue to improve ourselves and the health of our patients.  - start duloxetine 12m daily; after 2-4 weeks may increase to 447mdaily   ~~~~~~~~~~~~~~~~~~~~~~~~~~~~~~~~~~~~~~~~~~~~~~~~~~~~~~~~~~~~~~~~~  DR. Avia Merkley'S GUIDE TO HAPPY AND HEALTHY LIVING These are some of my general health and wellness recommendations. Some of them may apply to you better than others. Please use common sense as you try these suggestions and feel free to ask me any questions.   ACTIVITY/FITNESS Mental, social, emotional and physical stimulation are very important for brain and body health. Try learning a new activity (arts, music, language, sports, games).  Keep moving your body to the best of your abilities. You can do this at home, inside or outside, the park, community center, gym or anywhere you like. Consider a physical therapist or personal trainer to get started. Consider the app Sworkit. Fitness trackers such as smart-watches, smart-phones or Fitbits can help as well.   NUTRITION Eat more plants: colorful vegetables, nuts, seeds and berries.  Eat less sugar, salt, preservatives and processed foods.  Avoid toxins such as cigarettes and alcohol.  Drink water when you are thirsty. Warm water with a slice of lemon is an excellent morning drink to start the day.  Consider these websites for more information The Nutrition Source (hthttps://www.henry-hernandez.biz/Precision Nutrition (wwWindowBlog.ch  RELAXATION Consider practicing mindfulness meditation or other relaxation techniques such as deep breathing, prayer, yoga, tai chi, massage. See website mindful.org or the apps Headspace or Calm to  help get started.   SLEEP Try to get at least 7-8+ hours sleep per day. Regular exercise and reduced caffeine will help you sleep better. Practice good sleep hygeine techniques. See website sleep.org for more information.   PLANNING Prepare estate planning, living will, healthcare POA documents. Sometimes this is best planned with the help of an attorney. Theconversationproject.org and agingwithdignity.org are excellent resources.

## 2014-12-08 LAB — CK: CK TOTAL: 48 U/L (ref 24–173)

## 2014-12-08 LAB — ALDOLASE: Aldolase: 5.7 U/L (ref 3.3–10.3)

## 2014-12-09 ENCOUNTER — Telehealth: Payer: Self-pay | Admitting: *Deleted

## 2014-12-09 DIAGNOSIS — M1712 Unilateral primary osteoarthritis, left knee: Secondary | ICD-10-CM | POA: Diagnosis not present

## 2014-12-09 NOTE — Telephone Encounter (Signed)
Spoke with patient and informed her that her lab results are normal. She states that she would like Dr Leta Baptist to know that she stopped taking Cymbalta today due to side effects. She states "I felt like I was in a fog and had pains in my feet and legs." She states she feels "something is rubbing on a bone, maybe due to arthritis." She states she does not want to be prescribed a new medication and will "wait and see how I do."  She states she "does not tolerate antidepressants well, even though this medication can treat nerve pain". She did not request her FU be moved to a sooner date. She verbalized understanding and appreciation of call. Will route to Dr Leta Baptist for his information.

## 2014-12-13 ENCOUNTER — Encounter: Payer: Medicare Other | Admitting: Gastroenterology

## 2014-12-18 DIAGNOSIS — D485 Neoplasm of uncertain behavior of skin: Secondary | ICD-10-CM | POA: Diagnosis not present

## 2014-12-18 DIAGNOSIS — L905 Scar conditions and fibrosis of skin: Secondary | ICD-10-CM | POA: Diagnosis not present

## 2014-12-26 ENCOUNTER — Encounter: Payer: Medicare Other | Admitting: Diagnostic Neuroimaging

## 2014-12-26 ENCOUNTER — Encounter: Payer: Self-pay | Admitting: Diagnostic Neuroimaging

## 2014-12-26 NOTE — Progress Notes (Signed)
i called patient; she will try gabapentin. She has gabapentin already. -VRP

## 2014-12-26 NOTE — Progress Notes (Signed)
Pt had appt schedule with Dr. Leta Baptist for some pain, burning in her feet,and it feels cold. Pt stated she had waited over a hour, and had to leave. MD was contacted, but was still with another patient. Pt did reschedule with Dr. Leta Baptist for January 2017. Pt left a phone number for Dr.Penumalli to call her at [phone]. Pt stated she had food in the car, and people coming over.

## 2014-12-27 NOTE — Progress Notes (Signed)
This encounter was created in error - please disregard.

## 2015-01-02 ENCOUNTER — Telehealth: Payer: Self-pay | Admitting: Diagnostic Neuroimaging

## 2015-01-02 NOTE — Telephone Encounter (Signed)
Pt called sts the last telephone conversation she had with Dr Leta Baptist he suggested a medication that could be gotten by mail order. She thought it was a cream or ointment. She said he did not give the name of it. She asked if it was Voltaren, he said no. She is inquiring what is the medication and could she have info for it and how to go about getting it. Pt aware Dr Leta Baptist is out of the office until Tuesday, she is ok to wait until then

## 2015-01-02 NOTE — Telephone Encounter (Signed)
thanks

## 2015-01-03 ENCOUNTER — Encounter: Payer: Self-pay | Admitting: Internal Medicine

## 2015-01-03 ENCOUNTER — Ambulatory Visit (INDEPENDENT_AMBULATORY_CARE_PROVIDER_SITE_OTHER): Payer: Medicare Other | Admitting: Internal Medicine

## 2015-01-03 VITALS — BP 120/78 | HR 68 | Temp 98.4°F | Resp 20 | Ht 61.0 in | Wt 190.0 lb

## 2015-01-03 DIAGNOSIS — M5126 Other intervertebral disc displacement, lumbar region: Secondary | ICD-10-CM | POA: Diagnosis not present

## 2015-01-03 DIAGNOSIS — I6529 Occlusion and stenosis of unspecified carotid artery: Secondary | ICD-10-CM | POA: Diagnosis not present

## 2015-01-03 NOTE — Progress Notes (Signed)
Pre visit review using our clinic review tool, if applicable. No additional management support is needed unless otherwise documented below in the visit note. 

## 2015-01-03 NOTE — Assessment & Plan Note (Signed)
Suspect that her left leg pain is related to her back degeneration. Given her information about nerve blocks to help with pain. She will try a tens unit that she has at home already. Would not like to try other medicines at this time.

## 2015-01-03 NOTE — Patient Instructions (Signed)
[facility] is the office that does the nerve blocks in the back.  [address] [place], [place] [address] Phone: [phone]  You can try the TENS unit you have at home and it is okay to keep taking the naproxen for the pain.   The Baker's cyst can be drained if it is giving you a lot of problems.   Baker Cyst A Baker cyst is a sac-like structure that forms in the back of the knee. It is filled with the same fluid that is located in your knee. This fluid lubricates the bones and cartilage of the knee and allows them to move over each other more easily. CAUSES  When the knee becomes injured or inflamed, increased fluid forms in the knee. When this happens, the joint lining is pushed out behind the knee and forms the Baker cyst. This cyst may also be caused by inflammation from arthritic conditions and infections. SIGNS AND SYMPTOMS  A Baker cyst usually has no symptoms. When the cyst is substantially enlarged:  You may feel pressure behind the knee, stiffness in the knee, or a mass in the area behind the knee.  You may develop pain, redness, and swelling in the calf. This can suggest a blood clot and requires evaluation by your health care provider. DIAGNOSIS  A Baker cyst is most often found during an ultrasound exam. This exam may have been performed for other reasons, and the cyst was found incidentally. Sometimes an MRI is used. This picks up other problems within a joint that an ultrasound exam may not. If the Baker cyst developed immediately after an injury, X-ray exams may be used to diagnose the cyst. TREATMENT  The treatment depends on the cause of the cyst. Anti-inflammatory medicines and rest often will be prescribed. If the cyst is caused by a bacterial infection, antibiotic medicines may be prescribed.  HOME CARE INSTRUCTIONS   If the cyst was caused by an injury, for the first 24 hours, keep the injured leg elevated on 2 pillows while lying  down.  For the first 24 hours while you are awake, apply ice to the injured area:  Put ice in a plastic bag.  Place a towel between your skin and the bag.  Leave the ice on for 20 minutes, 2-3 times a day.  Only take over-the-counter or prescription medicines for pain, discomfort, or fever as directed by your health care provider.  Only take antibiotic medicine as directed. Make sure to finish it even if you start to feel better. MAKE SURE YOU:   Understand these instructions.  Will watch your condition.  Will get help right away if you are not doing well or get worse.   This information is not intended to replace advice given to you by your health care provider. Make sure you discuss any questions you have with your health care provider.   Document Released: 12/21/2004 Document Revised: 10/11/2012 Document Reviewed: 08/02/2012 Elsevier Interactive Patient Education [employer].

## 2015-01-03 NOTE — Progress Notes (Signed)
   Subjective:    Patient ID: Rhonda Henry, female    DOB: 1945/12/02, 69 y.o.   MRN: [id number]  HPI The patient is a 69 YO female coming in for left leg pain. Starts in the low back and goes down to her feet. The left foot feels cold all the time although is not cold to the touch. She has seen her orthopedic, 2 neurosurgeons, and a neurologist about this. Had nerve conduction test without signs of neuropathy. She has been tried on gabapentin and cymbalta without any relief. She does not want to try more medicines without knowing the cause. She had MRI of her back with some worsening of the degeneration in the low back.   Review of Systems  Constitutional: Negative for fever, activity change, appetite change, fatigue and unexpected weight change.  Respiratory: Negative for cough, chest tightness, shortness of breath and wheezing.   Cardiovascular: Negative for chest pain, palpitations and leg swelling.  Gastrointestinal: Negative for abdominal pain, diarrhea, constipation and abdominal distention.  Musculoskeletal: Positive for myalgias, back pain and arthralgias.  Skin: Negative.   Neurological: Positive for numbness. Negative for dizziness and weakness.      Objective:   Physical Exam  Constitutional: She is oriented to person, place, and time. She appears well-developed and well-nourished.  HENT:  Head: Normocephalic and atraumatic.  Eyes: EOM are normal.  Neck: Normal range of motion.  Cardiovascular: Normal rate and regular rhythm.   Pulmonary/Chest: Effort normal and breath sounds normal. She has no wheezes.  Abdominal: Bowel sounds are normal. She exhibits no distension. There is no tenderness. There is no rebound.  Neurological: She is alert and oriented to person, place, and time. Coordination normal.  Skin: Skin is warm and dry.   Filed Vitals:   01/03/15 1035  BP: 120/78  Pulse: 68  Temp: 98.4 F (36.9 C)  TempSrc: Oral  Resp: 20  Height: 5\' 1"  (1.549 m)    Weight: 190 lb (86.183 kg)  SpO2: 99%      Assessment & Plan:

## 2015-01-07 ENCOUNTER — Telehealth: Payer: Self-pay | Admitting: Internal Medicine

## 2015-01-07 DIAGNOSIS — M5126 Other intervertebral disc displacement, lumbar region: Secondary | ICD-10-CM

## 2015-01-07 NOTE — Telephone Encounter (Signed)
Pt called in and would like a referral to [facility] for Dr Hardin Negus to get her nerve block.  This is where she is wanting to go.Rhonda Henry

## 2015-01-07 NOTE — Telephone Encounter (Signed)
Done

## 2015-01-08 ENCOUNTER — Telehealth: Payer: Self-pay | Admitting: Internal Medicine

## 2015-01-08 DIAGNOSIS — M25561 Pain in right knee: Secondary | ICD-10-CM

## 2015-01-08 DIAGNOSIS — M25562 Pain in left knee: Principal | ICD-10-CM

## 2015-01-08 NOTE — Telephone Encounter (Signed)
Pt called state that 01/03/15 for leg and back pain. Dr. Sharlet Salina suggested that she has her "cirgulation check or test" due to the problem she is having. Pt need more information about this and wondering how she get this start? Her Cardiologist is Dr. Johnsie Cancel if they are the one that do the test.

## 2015-01-09 DIAGNOSIS — M79661 Pain in right lower leg: Secondary | ICD-10-CM | POA: Diagnosis not present

## 2015-01-09 DIAGNOSIS — S83242A Other tear of medial meniscus, current injury, left knee, initial encounter: Secondary | ICD-10-CM | POA: Diagnosis not present

## 2015-01-09 NOTE — Telephone Encounter (Signed)
LVM for patient informing her , per Dr Leta Baptist that since she is referred to a new pain management clinic, he will defer any rx to them. Left this caller's name, number for further questions.

## 2015-01-09 NOTE — Telephone Encounter (Signed)
pls offer follow up for patient to clarify issues. -VRP

## 2015-01-09 NOTE — Telephone Encounter (Signed)
Patient is returning a call and has a question.

## 2015-01-09 NOTE — Telephone Encounter (Signed)
This is called an ABI and has been ordered. She will hear back about scheduling and they do them at the cardiology office.

## 2015-01-09 NOTE — Telephone Encounter (Signed)
This is likely the compounded neuropathy cream. However, she is getting referral to new pain mgmt clinic, so i would defer to them to rx. -VRP

## 2015-01-09 NOTE — Telephone Encounter (Signed)
Spoke with patient and informed her Dr Leta Baptist advises she have pain management clinic do Rx for her. She stated that she wants to know what the name of the cream is and where to get it so she can tell them. Informed her that neuropathy creams can come compounded with several different medications and available at specialty pharmacies. Informed her that since Dr Leta Baptist has only seen her once and his note did not indicate a neuropathy cream,  this RN could not give her specific information. She stated that she had to leave from the first FU in Dec 2016 due to "waiting too long and having out of town company waiting for her to pick them up at airport". Reminded her that she has not been seen for FU since, that the last 2 scheduled FU she cancelled with no reason.  She stated that she "wasn't sure the dr wanted to see her again". Informed her that "yes, Dr Leta Baptist wanted to see her again, and that is why the FU was rescheduled twice". She is requesting a FU to discuss possible neuropathy cream and "to let him know how I am doing".  She stated that she is taking Gabapentin 300 nightly "from another problem I had". She stated it does seem to be helpful. Informed her would route this information to Dr Leta Baptist and call her back with his recommendation, response. She verbalized understanding.

## 2015-01-10 NOTE — Telephone Encounter (Signed)
Per Dr Leta Baptist spoke with patient and scheduled her for 30 min FU to discuss issues. She verbalized understanding, appreciation.

## 2015-01-13 ENCOUNTER — Other Ambulatory Visit: Payer: Self-pay | Admitting: Internal Medicine

## 2015-01-15 ENCOUNTER — Other Ambulatory Visit: Payer: Self-pay | Admitting: Internal Medicine

## 2015-01-15 DIAGNOSIS — M25562 Pain in left knee: Principal | ICD-10-CM

## 2015-01-15 DIAGNOSIS — M25561 Pain in right knee: Secondary | ICD-10-CM

## 2015-01-17 DIAGNOSIS — M79604 Pain in right leg: Secondary | ICD-10-CM | POA: Diagnosis not present

## 2015-01-20 ENCOUNTER — Ambulatory Visit: Payer: Self-pay | Admitting: Diagnostic Neuroimaging

## 2015-01-20 ENCOUNTER — Other Ambulatory Visit: Payer: Self-pay | Admitting: Internal Medicine

## 2015-01-20 DIAGNOSIS — M25561 Pain in right knee: Secondary | ICD-10-CM

## 2015-01-20 DIAGNOSIS — M25562 Pain in left knee: Principal | ICD-10-CM

## 2015-01-21 DIAGNOSIS — M1712 Unilateral primary osteoarthritis, left knee: Secondary | ICD-10-CM | POA: Diagnosis not present

## 2015-01-23 ENCOUNTER — Ambulatory Visit: Payer: Medicare Other | Admitting: Diagnostic Neuroimaging

## 2015-01-24 ENCOUNTER — Encounter: Payer: Self-pay | Admitting: Diagnostic Neuroimaging

## 2015-01-24 ENCOUNTER — Ambulatory Visit (INDEPENDENT_AMBULATORY_CARE_PROVIDER_SITE_OTHER): Payer: Medicare Other | Admitting: Diagnostic Neuroimaging

## 2015-01-24 VITALS — BP 152/87 | HR 68 | Wt 189.6 lb

## 2015-01-24 DIAGNOSIS — M25562 Pain in left knee: Secondary | ICD-10-CM | POA: Diagnosis not present

## 2015-01-24 DIAGNOSIS — M25561 Pain in right knee: Secondary | ICD-10-CM | POA: Diagnosis not present

## 2015-01-24 DIAGNOSIS — R202 Paresthesia of skin: Secondary | ICD-10-CM

## 2015-01-24 NOTE — Patient Instructions (Signed)
-   follow up with PCP, orthopedics and pain management

## 2015-01-24 NOTE — Progress Notes (Signed)
[facility]  PATIENT: Rhonda Henry DOB: 08/14/1945  REFERRING CLINICIAN:  HISTORY FROM: patient  REASON FOR VISIT: follow up    HISTORICAL  CHIEF COMPLAINT:  Chief Complaint  Patient presents with  . Arthralgia of both lower legs    rm 7, "pain in left foot, knee, leg, lower back, foot feels ice cold"  . Follow-up    HISTORY OF PRESENT ILLNESS:   UPDATE 01/24/15: Since last visit, doing about the same. Still with back pain, left knee pain, left foot numbness. Some left arm numbness.  PRIOR HPI (12/06/14): 70 year old left-handed female here for evaluation of back pain and lower extremity pain. Patient has history of hypertension, acid reflux, right knee surgery. Around 2005 patient began having problems in the legs with cramps at night. She was evaluated by neurosurgery and then ultimately managed by Dr. Ace Gins pain management with lower lumbar spinal injections. This seemed to relieve her cramps and pain in the legs. She had these injections periodically over the past 11 years. More recently patient has had increasing cramps and pain which have not been relieved by epidural steroid injections. She is also developed burning sensation on the bottom of her left foot. Symptoms are worse with activity. Sometimes she has a "water sensation" radiating down her legs, groin region and calf region. Patient has been evaluated by multiple specialists including [facility], where an EMG nerve conduction was performed and apparently normal, as well as by orthopedic surgery Dr. Lynann Bologna, who found no specific orthopedic issues. In the past patient has tried Lyrica and Tegretol for pain relief. Currently she is only taking as needed over-the-counter medication for pain relief.   REVIEW OF SYSTEMS: Full 14 system review of systems performed and notable only for anxiety numbness weakness joint pain swelling in legs fatigue moles.   ALLERGIES: Allergies  Allergen  Reactions  . Penicillin G Hives  . Desipramine Hcl Rash  . Penicillins Hives and Rash     Has patient had a PCN reaction causing immediate rash, facial/tongue/throat swelling, SOB or lightheadedness with hypotension: Yes Has patient had a PCN reaction causing severe rash involving mucus membranes or skin necrosis: Yes Has patient had a PCN reaction that required hospitalization No Has patient had a PCN reaction occurring within the last 10 years: No If all of the above answers are "NO", then may proceed with Cephalosporin use.   . Tape Hives    HOME MEDICATIONS: Outpatient Prescriptions Prior to Visit  Medication Sig Dispense Refill  . Ascorbic Acid (VITAMIN C) 1000 MG tablet Take 2,000 mg by mouth daily.    Marland Kitchen b complex vitamins tablet Take 1 tablet by mouth daily.    . clindamycin (CLEOCIN) 150 MG capsule Take 150 mg by mouth as needed. Take after sex    . diazepam (VALIUM) 5 MG tablet TAKE 1 TABLET BY MOUTH EVERY 12 HOURS AS NEEDED FOR ANXIETY 30 tablet 3  . diclofenac sodium (VOLTAREN) 1 % GEL Apply 1 application topically daily as needed.  1  . EPINEPHrine (EPIPEN 2-PAK) 0.3 mg/0.3 mL IJ SOAJ injection Inject 0.3 mLs (0.3 mg total) into the muscle once. 1 Device 3  . esomeprazole (NEXIUM) 20 MG capsule Take 20 mg by mouth daily at 12 noon.    Marland Kitchen ibuprofen (ADVIL,MOTRIN) 200 MG tablet Take 600 mg by mouth every 6 (six) hours as needed for headache or moderate pain.    . Lidocaine 0.5 % GEL Apply topically.    Marland Kitchen losartan (COZAAR)  50 MG tablet TAKE 1 TABLET BY MOUTH DAILY 90 tablet 0  . Magnesium 250 MG TABS Take by mouth.    . Multiple Vitamins-Minerals (ZINC PO) Take by mouth.    . naproxen (NAPROSYN) 500 MG tablet Take 500 mg by mouth 2 (two) times daily with a meal.    . Potassium Gluconate 550 MG TABS Take by mouth.    Marland Kitchen VITAMIN D, CHOLECALCIFEROL, PO Take 5,000 Units by mouth.     No facility-administered medications prior to visit.    PAST MEDICAL HISTORY: Past Medical  History  Diagnosis Date  . Arthritis   . Complication of anesthesia     "I shake like crazy"  . Recurrent UTI   . Microscopic hematuria      x 30 yrs   . Anxiety   . Difficulty sleeping     no issues now  . Abdominal pain, acute, right upper quadrant     had gall bladder removed and now no issues   . Bloating   . Constipation     not now  . Nausea   . Depression     no issues currently   . Neuromuscular disorder ([place])   . Nerve damage     face -1 yr ago and now almost resolved     PAST SURGICAL HISTORY: Past Surgical History  Procedure Laterality Date  . Facial cosmetic surgery  7-10    compl by thick scars and a scar necrosis on R cheek  . Breast biopsies    . Breast lumpectomy      x 2  . Refractive surgery  1997  . Lumbar epidural injection  Jan 15   . Cyst excision      removed from back and left arm  . C sections      x2  . Cholecystectomy N/A 03/13/2013    Procedure: LAPAROSCOPIC CHOLECYSTECTOMY WITH INTRAOPERATIVE CHOLANGIOGRAM;  Surgeon: [name]. [name], MD;  Location: [facility];  Service: General;  Laterality: N/A;    FAMILY HISTORY: Family History  Problem Relation Age of Onset  . Hypertension Other   . Thyroid cancer Mother   . Heart disease Mother   . Macular degeneration Mother   . Stroke Mother   . Hypertension Mother   . Glaucoma Father   . Colon cancer Neg Hx     SOCIAL HISTORY:  Social History   Social History  . Marital Status: Married    Spouse Name: Herbie Baltimore  . Number of Children: 2  . Years of Education: 16   Occupational History  . Nurse, learning disability     owns own business   Social History Main Topics  . Smoking status: Never Smoker   . Smokeless tobacco: Never Used  . Alcohol Use: 0.0 oz/week    0 Standard drinks or equivalent per week     Comment: socially 1-2 glasses weekly  . Drug Use: No  . Sexual Activity: Yes    Birth Control/ Protection: Other-see comments   Other Topics Concern  . Not on file   Social History  Narrative   Lives with husband in a 2 story home.  Has 2 grown children.  Owns her own business.  Education: Forensic psychologist.      PHYSICAL EXAM  GENERAL EXAM/CONSTITUTIONAL: Vitals:  Filed Vitals:   01/24/15 0913  BP: 152/87  Pulse: 68  Weight: 189 lb 9.6 oz (86.002 kg)   Body mass index is 35.84 kg/(m^2). No exam data present  Patient  is in no distress; well developed, nourished and groomed; neck is supple  CARDIOVASCULAR:  Examination of carotid arteries is normal; no carotid bruits  Regular rate and rhythm, no murmurs  Examination of peripheral vascular system by observation and palpation is normal  EYES:  Ophthalmoscopic exam of optic discs and posterior segments is normal; no papilledema or hemorrhages  MUSCULOSKELETAL:  Gait, strength, tone, movements noted in Neurologic exam below  NEUROLOGIC: MENTAL STATUS:  No flowsheet data found.  awake, alert, oriented to person, place and time  recent and remote memory intact  normal attention and concentration  language fluent, comprehension intact, naming intact,   fund of knowledge appropriate  CRANIAL NERVE:   2nd, 3rd, 4th, 6th - pupils equal and reactive to light, visual fields full to confrontation, extraocular muscles intact, no nystagmus  5th - facial sensation symmetric  7th - facial strength symmetric  8th - hearing intact  9th - palate elevates symmetrically, uvula midline  11th - shoulder shrug symmetric  12th - tongue protrusion midline  MOTOR:   normal bulk and tone, full strength in the BUE, BLE  SENSORY:   normal and symmetric to light touch, temperature, vibration; HYPERSENS IN FEET TO PP  COORDINATION:   finger-nose-finger, fine finger movements normal  REFLEXES:   deep tendon reflexes TRACE; ABSENT AT ANKLES  GAIT/STATION:   narrow based gait; ANTALGIC; LIMPING ON LEFT KNEE    DIAGNOSTIC DATA (LABS, IMAGING, TESTING) - I reviewed patient records, labs, notes,  testing and imaging myself where available.  Lab Results  Component Value Date   WBC 10.0 11/27/2014   HGB 13.8 11/27/2014   HCT 43.1 11/27/2014   MCV 87.8 11/27/2014   PLT 234 11/27/2014      Component Value Date/Time   NA 140 11/27/2014 1251   K 3.8 11/27/2014 1251   CL 104 11/27/2014 1251   CO2 29 11/27/2014 1251   GLUCOSE 98 11/27/2014 1251   BUN 18 11/27/2014 1251   CREATININE 0.81 11/27/2014 1251   CREATININE 0.72 10/09/2013 1108   CALCIUM 9.4 11/27/2014 1251   PROT 6.5 11/27/2014 1251   ALBUMIN 3.9 11/27/2014 1251   AST 28 11/27/2014 1251   ALT 30 11/27/2014 1251   ALKPHOS 63 11/27/2014 1251   BILITOT 0.7 11/27/2014 1251   GFRNONAA >60 11/27/2014 1251   GFRNONAA 86 10/09/2013 1108   GFRAA >60 11/27/2014 1251   GFRAA >89 10/09/2013 1108   Lab Results  Component Value Date   CHOL 205* 06/07/2014   HDL 46.90 06/07/2014   LDLCALC 140* 06/07/2014   TRIG 92.0 06/07/2014   CHOLHDL 4 06/07/2014   Lab Results  Component Value Date   HGBA1C 5.3 02/15/2006   Lab Results  Component Value Date   VITAMINB12 1063* 11/07/2014   Lab Results  Component Value Date   TSH 1.508 11/07/2014    09/20/14 MRI lumbar [I reviewed images myself and agree with interpretation. -VRP]  - Multilevel spondylosis as described. Slight progression of subarticular zone narrowing at L4-5, LEFT greater than RIGHT, related to annular bulging and posterior element hypertrophy.   10/24/14 MRI cervical spine [I reviewed images myself and agree with interpretation. -VRP]  - Slight deformity of the ventral cord and mild bilateral foraminal narrowing at C5-6 due to a disc osteophyte complex and uncovertebral disease.  - Uncovertebral disease causes mild to moderate left foraminal narrowing at C4-5. - Shallow right paracentral protrusion at C6-7 contacts the cord without deforming it.    ASSESSMENT AND PLAN  70 y.o. year old female here with chronic low back pain, lumbar radiculopathy  symptoms, will managed with epidural steroid injections over the past 11 years, now with increasing pain in the legs with burning of the left foot. MRI of the cervical and lumbar spine reviewed showing mild degenerative changes without specific focal pathology or nerve impingements or surgically treatable issues. Screening test for neuropathy have been performed including B12, glucose, copper, thyroid function, as well as EMG nerve conduction study which have been unremarkable.   Ddx: arthritis, musculoskeletal, small fiber neuropathy, lumbar radiculopathy  Dx: Arthralgia of both lower legs  Paresthesias   PLAN: - PT evaluation per orthopedics  Return if symptoms worsen or fail to improve, for return to PCP.    Penni Bombard, MD [id number], [id number] AM Certified in Neurology, Neurophysiology and Neuroimaging  [facility] [address] [place], [place] [address] [phone]

## 2015-01-27 ENCOUNTER — Encounter (HOSPITAL_COMMUNITY): Payer: Medicare Other

## 2015-01-30 ENCOUNTER — Ambulatory Visit (HOSPITAL_COMMUNITY)
Admission: RE | Admit: 2015-01-30 | Discharge: 2015-01-30 | Disposition: A | Discharge status: Home / Self Care | Payer: Medicare Other | Source: Ambulatory Visit | Attending: Internal Medicine | Admitting: Internal Medicine

## 2015-01-30 DIAGNOSIS — M25562 Pain in left knee: Secondary | ICD-10-CM | POA: Diagnosis not present

## 2015-01-30 DIAGNOSIS — M25561 Pain in right knee: Secondary | ICD-10-CM | POA: Insufficient documentation

## 2015-02-10 DIAGNOSIS — F4322 Adjustment disorder with anxiety: Secondary | ICD-10-CM | POA: Diagnosis not present

## 2015-02-13 DIAGNOSIS — Z01419 Encounter for gynecological examination (general) (routine) without abnormal findings: Secondary | ICD-10-CM | POA: Diagnosis not present

## 2015-02-13 DIAGNOSIS — Z124 Encounter for screening for malignant neoplasm of cervix: Secondary | ICD-10-CM | POA: Diagnosis not present

## 2015-02-17 ENCOUNTER — Other Ambulatory Visit (HOSPITAL_COMMUNITY): Payer: Self-pay | Admitting: Psychiatry

## 2015-02-17 DIAGNOSIS — R531 Weakness: Secondary | ICD-10-CM

## 2015-02-18 ENCOUNTER — Ambulatory Visit
Admission: RE | Admit: 2015-02-18 | Discharge: 2015-02-18 | Disposition: A | Discharge status: Home / Self Care | Payer: Medicare Other | Source: Ambulatory Visit | Attending: Psychiatry | Admitting: Psychiatry

## 2015-02-18 DIAGNOSIS — R531 Weakness: Secondary | ICD-10-CM

## 2015-02-18 MED ORDER — GADOBENATE DIMEGLUMINE 529 MG/ML IV SOLN
17.0000 mL | Freq: Once | INTRAVENOUS | Status: AC | PRN
  Administered 2015-02-18: 17 mL via INTRAVENOUS

## 2015-02-24 ENCOUNTER — Telehealth: Payer: Self-pay | Admitting: Diagnostic Neuroimaging

## 2015-02-24 DIAGNOSIS — M25562 Pain in left knee: Secondary | ICD-10-CM | POA: Diagnosis not present

## 2015-02-24 NOTE — Telephone Encounter (Signed)
LVM for patient suggesting she call hre PCP first to be seen. Otherwise she may call and schedule FU with Dr Leta Baptist. Left this caller's name, number. Phone staff may schedule FU if patient calls back.

## 2015-02-24 NOTE — Telephone Encounter (Signed)
Pt called this afternoon complaining of searing pain in her left arm and fingers.It comes and goes at  time but will use ice to help reduce the sensation as well taking Ibuprofen.She feels stress may make it worse.  She says she is noticing skin coloration changes. Red splotches all over her arm . She has started to use a cream, clinque moisturizer to help with the dryness   She says she is extremely hot/cold all the time and will wake in the night having to drink lots of water. She also says she has an increase in thirst as well. She is very confused about what is going on . She feels that it may be autoimmune. Is there some testing she can do? She would like to come in to talk with Dr. Leta Baptist if need be. Please call and advise [phone]

## 2015-02-25 NOTE — Telephone Encounter (Signed)
Spoke with patient to FU on previous call. She stated she has appointment with her PCP tomorrow and appt soon in pain clinic. She also has appt at [facility] in May for a second opinion. She stated she continues to have rash, redness, burning that comes and goes on her arms. She will discuss possible tests with her PCP and call this office back for future needs. She verbalized understanding and appreciation of call today.

## 2015-02-26 ENCOUNTER — Ambulatory Visit (INDEPENDENT_AMBULATORY_CARE_PROVIDER_SITE_OTHER): Payer: Medicare Other | Admitting: Internal Medicine

## 2015-02-26 ENCOUNTER — Other Ambulatory Visit: Payer: Self-pay | Admitting: Internal Medicine

## 2015-02-26 ENCOUNTER — Other Ambulatory Visit (INDEPENDENT_AMBULATORY_CARE_PROVIDER_SITE_OTHER): Payer: Medicare Other

## 2015-02-26 ENCOUNTER — Encounter: Payer: Self-pay | Admitting: Internal Medicine

## 2015-02-26 VITALS — BP 134/84 | HR 80 | Temp 98.5°F | Resp 12 | Ht 61.0 in | Wt 186.1 lb

## 2015-02-26 DIAGNOSIS — R200 Anesthesia of skin: Secondary | ICD-10-CM

## 2015-02-26 DIAGNOSIS — E559 Vitamin D deficiency, unspecified: Secondary | ICD-10-CM | POA: Diagnosis not present

## 2015-02-26 DIAGNOSIS — E042 Nontoxic multinodular goiter: Secondary | ICD-10-CM | POA: Diagnosis not present

## 2015-02-26 DIAGNOSIS — R202 Paresthesia of skin: Secondary | ICD-10-CM

## 2015-02-26 DIAGNOSIS — F418 Other specified anxiety disorders: Secondary | ICD-10-CM

## 2015-02-26 DIAGNOSIS — F5105 Insomnia due to other mental disorder: Secondary | ICD-10-CM

## 2015-02-26 DIAGNOSIS — F341 Dysthymic disorder: Secondary | ICD-10-CM | POA: Diagnosis not present

## 2015-02-26 DIAGNOSIS — R209 Unspecified disturbances of skin sensation: Secondary | ICD-10-CM | POA: Diagnosis not present

## 2015-02-26 LAB — TSH: TSH: 2.48 u[IU]/mL (ref 0.35–4.50)

## 2015-02-26 LAB — COMPREHENSIVE METABOLIC PANEL
ALBUMIN: 4 g/dL (ref 3.5–5.2)
ALK PHOS: 71 U/L (ref 39–117)
ALT: 13 U/L (ref 0–35)
AST: 16 U/L (ref 0–37)
BILIRUBIN TOTAL: 0.5 mg/dL (ref 0.2–1.2)
BUN: 20 mg/dL (ref 6–23)
CALCIUM: 9.8 mg/dL (ref 8.4–10.5)
CO2: 30 mEq/L (ref 19–32)
CREATININE: 0.71 mg/dL (ref 0.40–1.20)
Chloride: 105 mEq/L (ref 96–112)
GFR: 86.48 mL/min (ref >60.00)
Glucose, Bld: 98 mg/dL (ref 70–99)
Potassium: 4 mEq/L (ref 3.5–5.1)
Sodium: 141 mEq/L (ref 135–145)
TOTAL PROTEIN: 6.5 g/dL (ref 6.0–8.3)

## 2015-02-26 LAB — T4, FREE: FREE T4: 1.19 ng/dL (ref 0.60–1.60)

## 2015-02-26 LAB — CBC
HCT: 39.7 % (ref 36.0–46.0)
Hemoglobin: 13.3 g/dL (ref 12.0–15.0)
MCHC: 33.4 g/dL (ref 30.0–36.0)
MCV: 82.6 fl (ref 78.0–100.0)
PLATELETS: 255 10*3/uL (ref 150.0–400.0)
RBC: 4.8 Mil/uL (ref 3.87–5.11)
RDW: 13.2 % (ref 11.5–15.5)
WBC: 7.2 10*3/uL (ref 4.0–10.5)

## 2015-02-26 LAB — RHEUMATOID FACTOR

## 2015-02-26 LAB — VITAMIN D 25 HYDROXY (VIT D DEFICIENCY, FRACTURES): VITD: 25.79 ng/mL — ABNORMAL LOW (ref 30.00–100.00)

## 2015-02-26 MED ORDER — ESCITALOPRAM OXALATE 5 MG PO TABS: 5.0000 mg | ORAL_TABLET | Freq: Every day | ORAL | Status: DC

## 2015-02-26 NOTE — Assessment & Plan Note (Signed)
Starting lexapro 5 mg daily and advance very slowly.

## 2015-02-26 NOTE — Patient Instructions (Signed)
We will check the blood work today and call you back with the results.   We have sent in the lowest strength of the lexapro 5 mg. This is half the normal starting dose so you may not notice a lot of benefit with it but the goal is to not have side effects with it. We can slowly increase if you are doing okay with it. Call us in 1-2 weeks and we can increase if you are doing well.

## 2015-02-26 NOTE — Assessment & Plan Note (Signed)
Checking TSH and free T4.  

## 2015-02-26 NOTE — Progress Notes (Signed)
Pre visit review using our clinic review tool, if applicable. No additional management support is needed unless otherwise documented below in the visit note. 

## 2015-02-26 NOTE — Assessment & Plan Note (Signed)
Checking autoimmune workup today for the ongoing neurological symptoms. Rechecking zinc as previously low and vitamin D.

## 2015-02-26 NOTE — Progress Notes (Signed)
   Subjective:    Patient ID: Rhonda Henry, female    DOB: 1945-12-10, 70 y.o.   MRN: [id number]  HPI The patient is a 70 YO female coming in for numbness and stinging pains in her arms, leg, and her face. Back in October she came off lyrica which was helping with her facial pain but was expensive. Did well off for several months. Started back again and she went back on lyrica but it it did not help. Saw neurology and no etiology discovered. Tried on cymbalta and got nerve pains 1 day into treatment. She has a visit with a pain specialist in about 2 weeks. Is beside herself with depression over her symptoms and pain and lack of understanding of what is happening. Recent LE arterial doppler with adequate blood flow to her legs. No weight change, some skin rash on her arms and more photosensitive.   Review of Systems  Constitutional: Negative for fever, activity change, appetite change, fatigue and unexpected weight change.  Respiratory: Negative for cough, chest tightness, shortness of breath and wheezing.   Cardiovascular: Negative for chest pain, palpitations and leg swelling.  Gastrointestinal: Negative for abdominal pain, diarrhea, constipation and abdominal distention.  Musculoskeletal: Positive for myalgias, back pain and arthralgias.  Skin: Negative.   Neurological: Positive for numbness. Negative for dizziness, weakness and headaches.  Psychiatric/Behavioral: Positive for sleep disturbance, dysphoric mood and decreased concentration. Negative for suicidal ideas and self-injury. The patient is nervous/anxious.       Objective:   Physical Exam  Constitutional: She is oriented to person, place, and time. She appears well-developed and well-nourished. She appears distressed.  HENT:  Head: Normocephalic and atraumatic.  Eyes: EOM are normal.  Neck: Normal range of motion.  Cardiovascular: Normal rate and regular rhythm.   Pulmonary/Chest: Effort normal and breath sounds normal. She  has no wheezes.  Abdominal: Bowel sounds are normal. She exhibits no distension. There is no tenderness. There is no rebound.  Neurological: She is alert and oriented to person, place, and time. Coordination normal.  Skin: Skin is warm and dry.  Psychiatric:  Distraught during visit and tearful   Filed Vitals:   02/26/15 0804  BP: 134/84  Pulse: 80  Temp: 98.5 F (36.9 C)  TempSrc: Oral  Resp: 12  Height: 5\' 1"  (1.549 m)  Weight: 186 lb 1.9 oz (84.423 kg)  SpO2: 98%      Assessment & Plan:

## 2015-02-27 LAB — ANA: ANA: NEGATIVE

## 2015-02-28 ENCOUNTER — Encounter: Payer: Self-pay | Admitting: Nurse Practitioner

## 2015-02-28 ENCOUNTER — Other Ambulatory Visit (HOSPITAL_COMMUNITY): Payer: Self-pay | Admitting: Psychiatry

## 2015-02-28 ENCOUNTER — Ambulatory Visit (INDEPENDENT_AMBULATORY_CARE_PROVIDER_SITE_OTHER): Payer: Medicare Other | Admitting: Nurse Practitioner

## 2015-02-28 ENCOUNTER — Telehealth: Payer: Self-pay | Admitting: Internal Medicine

## 2015-02-28 DIAGNOSIS — R002 Palpitations: Secondary | ICD-10-CM | POA: Insufficient documentation

## 2015-02-28 DIAGNOSIS — Z9189 Other specified personal risk factors, not elsewhere classified: Secondary | ICD-10-CM | POA: Diagnosis not present

## 2015-02-28 NOTE — Assessment & Plan Note (Signed)
Since new medication (nortriptyline) puts pt at risk for arrhythmias and MI, Dr. Casimiro Needle wants an EKG on her.  NSR with slight RSR (abscence of symptoms and found in healthy individuals). Dr. Casimiro Needle was called and notified. She is okay to start the medication. Patient was notified in the office before leaving that she may start when he calls it in.  Pt verbalized understanding before leaving.

## 2015-02-28 NOTE — Progress Notes (Signed)
Patient ID: Rhonda Henry, female    DOB: [birth year]  Age: 70 y.o. MRN: [id number]  CC: No chief complaint on file.   HPI Rhonda Henry presents for EKG  Starting on Nortriptyline No recent EKG EKG today was NSR  Pt was seen by Dr. Casimiro Needle and he wanted a call returned after EKG was completed.  After speaking with him about NSR results, he was okay to start this medication.     History Rhonda Henry has a past medical history of Arthritis; Complication of anesthesia; Recurrent UTI; Microscopic hematuria; Anxiety; Difficulty sleeping; Abdominal pain, acute, right upper quadrant; Bloating; Constipation; Nausea; Depression; Neuromuscular disorder ([place]); and Nerve damage.   She has past surgical history that includes Facial cosmetic surgery (7-10); breast biopsies; Breast lumpectomy; Refractive surgery (1997); Lumbar epidural injection (Jan 15 ); Cyst excision; c sections; and Cholecystectomy (N/A, 03/13/2013).   Her family history includes Glaucoma in her father; Heart disease in her mother; Hypertension in her mother and other; Macular degeneration in her mother; Stroke in her mother; Thyroid cancer in her mother. There is no history of Colon cancer.She reports that she has never smoked. She has never used smokeless tobacco. She reports that she drinks alcohol. She reports that she does not use illicit drugs.  Outpatient Prescriptions Prior to Visit  Medication Sig Dispense Refill  . Ascorbic Acid (VITAMIN C) 1000 MG tablet Take 2,000 mg by mouth daily.    Marland Kitchen b complex vitamins tablet Take 1 tablet by mouth daily.    . clindamycin (CLEOCIN) 150 MG capsule Take 150 mg by mouth as needed. Take after sex    . diazepam (VALIUM) 5 MG tablet TAKE 1 TABLET BY MOUTH EVERY 12 HOURS AS NEEDED FOR ANXIETY 30 tablet 3  . diclofenac sodium (VOLTAREN) 1 % GEL Apply 1 application topically daily as needed.  1  . diphenhydrAMINE (BENADRYL) 25 MG tablet Take 25 mg by mouth every 6 (six) hours as needed.     Marland Kitchen EPINEPHrine (EPIPEN 2-PAK) 0.3 mg/0.3 mL IJ SOAJ injection Inject 0.3 mLs (0.3 mg total) into the muscle once. 1 Device 3  . escitalopram (LEXAPRO) 5 MG tablet TAKE 1 TABLET(5 MG) BY MOUTH DAILY 90 tablet 0  . esomeprazole (NEXIUM) 20 MG capsule Take 20 mg by mouth daily at 12 noon.    Marland Kitchen ibuprofen (ADVIL,MOTRIN) 200 MG tablet Take 600 mg by mouth every 6 (six) hours as needed for headache or moderate pain.    . Lidocaine 0.5 % GEL Apply topically.    Marland Kitchen losartan (COZAAR) 50 MG tablet TAKE 1 TABLET BY MOUTH DAILY 90 tablet 0  . Magnesium 250 MG TABS Take by mouth.    . Multiple Vitamins-Minerals (ZINC PO) Take by mouth.    . naproxen (NAPROSYN) 500 MG tablet Take 500 mg by mouth 2 (two) times daily with a meal.    . Potassium Gluconate 550 MG TABS Take by mouth.    Marland Kitchen VITAMIN D, CHOLECALCIFEROL, PO Take 5,000 Units by mouth.     No facility-administered medications prior to visit.    ROS Review of Systems  Constitutional: Negative for fever, chills, diaphoresis and fatigue.  Psychiatric/Behavioral: Negative for confusion and agitation. The patient is nervous/anxious.     Objective:  BP 136/80 mmHg  Pulse 79  Wt 186 lb (84.369 kg)  SpO2 97%  Physical Exam  Constitutional: She is oriented to person, place, and time. She appears well-developed and well-nourished. No distress.  Cardiovascular: Normal rate and regular rhythm.  Pulmonary/Chest: Effort normal and breath sounds normal.  Neurological: She is alert and oriented to person, place, and time.  Skin: She is not diaphoretic.  Psychiatric:  Not tearful, Good eye contact      Assessment & Plan:   Diagnoses and all orders for this visit:  At high risk for cardiac arrhythmia -     EKG 12-Lead   I am having [name] maintain her clindamycin, EPINEPHrine, diazepam, vitamin C, esomeprazole, ibuprofen, diclofenac sodium, b complex vitamins, Lidocaine, Potassium Gluconate, Magnesium, Multiple Vitamins-Minerals (ZINC PO),  (VITAMIN D, CHOLECALCIFEROL, PO), naproxen, losartan, diphenhydrAMINE, and escitalopram.  No orders of the defined types were placed in this encounter.     Follow-up: Return if symptoms worsen or fail to improve.

## 2015-02-28 NOTE — Progress Notes (Signed)
Pre visit review using our clinic review tool, if applicable. No additional management support is needed unless otherwise documented below in the visit note. 

## 2015-02-28 NOTE — Telephone Encounter (Signed)
i have talked with dr Casimiro Needle (psychologist) and i have advised carrie doss, np----patient is being worked into schedule today before noon as soon as patient can come, to have ekg---carrie doss to contact dr Casimiro Needle with ekg results and then advise if ok for patient to start a new medication----jennifer/front desk to advise when patient arrives, carrie doss will work patient in and do ekg and advise patient if ok to start new med from dr Casimiro Needle, carrie doss to call dr Casimiro Needle back with ekg results---dr crawford advised, and she has also talked with dr Casimiro Needle

## 2015-02-28 NOTE — Telephone Encounter (Signed)
Pt called stated her psychologist request for her to and EKG done before he can prescribe certain medication to her. Pt stated Dr. Sharlet Salina gave her medication the other day and she has bad reaction to it. Please advise, she wants it done Today.   The was on the phone with Tammy wait to speak to Dr. Sharlet Salina but he hung up before we can get back with him.

## 2015-03-01 LAB — ZINC: Zinc: 68 ug/dL (ref 60–130)

## 2015-03-10 ENCOUNTER — Other Ambulatory Visit (HOSPITAL_COMMUNITY): Payer: Self-pay | Admitting: Psychiatry

## 2015-03-10 DIAGNOSIS — Z79899 Other long term (current) drug therapy: Secondary | ICD-10-CM | POA: Diagnosis not present

## 2015-03-10 DIAGNOSIS — M79642 Pain in left hand: Secondary | ICD-10-CM | POA: Diagnosis not present

## 2015-03-10 DIAGNOSIS — Z79891 Long term (current) use of opiate analgesic: Secondary | ICD-10-CM | POA: Diagnosis not present

## 2015-03-10 DIAGNOSIS — G894 Chronic pain syndrome: Secondary | ICD-10-CM | POA: Diagnosis not present

## 2015-03-10 DIAGNOSIS — M79672 Pain in left foot: Secondary | ICD-10-CM | POA: Diagnosis not present

## 2015-03-11 ENCOUNTER — Ambulatory Visit: Payer: Medicare Other | Admitting: Diagnostic Neuroimaging

## 2015-03-24 DIAGNOSIS — G894 Chronic pain syndrome: Secondary | ICD-10-CM | POA: Diagnosis not present

## 2015-03-24 DIAGNOSIS — M79672 Pain in left foot: Secondary | ICD-10-CM | POA: Diagnosis not present

## 2015-03-24 DIAGNOSIS — M79642 Pain in left hand: Secondary | ICD-10-CM | POA: Diagnosis not present

## 2015-03-24 DIAGNOSIS — Z79899 Other long term (current) drug therapy: Secondary | ICD-10-CM | POA: Diagnosis not present

## 2015-03-31 DIAGNOSIS — F4322 Adjustment disorder with anxiety: Secondary | ICD-10-CM | POA: Diagnosis not present

## 2015-04-08 DIAGNOSIS — M79641 Pain in right hand: Secondary | ICD-10-CM | POA: Diagnosis not present

## 2015-04-08 DIAGNOSIS — M79642 Pain in left hand: Secondary | ICD-10-CM | POA: Diagnosis not present

## 2015-04-08 DIAGNOSIS — G894 Chronic pain syndrome: Secondary | ICD-10-CM | POA: Diagnosis not present

## 2015-04-08 DIAGNOSIS — Z79899 Other long term (current) drug therapy: Secondary | ICD-10-CM | POA: Diagnosis not present

## 2015-04-12 ENCOUNTER — Other Ambulatory Visit: Payer: Self-pay | Admitting: Internal Medicine

## 2015-04-13 DIAGNOSIS — R05 Cough: Secondary | ICD-10-CM | POA: Diagnosis not present

## 2015-04-13 DIAGNOSIS — J208 Acute bronchitis due to other specified organisms: Secondary | ICD-10-CM | POA: Diagnosis not present

## 2015-04-16 ENCOUNTER — Ambulatory Visit: Payer: Medicare Other | Admitting: Nurse Practitioner

## 2015-04-16 DIAGNOSIS — Z0289 Encounter for other administrative examinations: Secondary | ICD-10-CM

## 2015-04-20 ENCOUNTER — Encounter: Payer: Self-pay | Admitting: Internal Medicine

## 2015-04-30 ENCOUNTER — Telehealth: Payer: Self-pay

## 2015-04-30 DIAGNOSIS — M25562 Pain in left knee: Secondary | ICD-10-CM | POA: Diagnosis not present

## 2015-04-30 NOTE — Telephone Encounter (Signed)
Patient is having a knee surgery in may. She wanted to know if you could clear her for surgery or if she needs to come in to be seen. Please advise.

## 2015-05-01 NOTE — Telephone Encounter (Signed)
Can be cleared, would need to get form to the office before May 2nd if needing before May 19th.

## 2015-05-01 NOTE — Telephone Encounter (Signed)
Patient aware.

## 2015-05-07 DIAGNOSIS — Z79899 Other long term (current) drug therapy: Secondary | ICD-10-CM | POA: Diagnosis not present

## 2015-05-07 DIAGNOSIS — G894 Chronic pain syndrome: Secondary | ICD-10-CM | POA: Diagnosis not present

## 2015-05-07 DIAGNOSIS — M79641 Pain in right hand: Secondary | ICD-10-CM | POA: Diagnosis not present

## 2015-05-07 DIAGNOSIS — M79642 Pain in left hand: Secondary | ICD-10-CM | POA: Diagnosis not present

## 2015-05-08 ENCOUNTER — Telehealth: Payer: Self-pay | Admitting: Cardiovascular Disease

## 2015-05-08 NOTE — Telephone Encounter (Signed)
Walk in pt Form- [facility] Clearance-dropped off gave to [employer]

## 2015-05-15 DIAGNOSIS — G609 Hereditary and idiopathic neuropathy, unspecified: Secondary | ICD-10-CM | POA: Diagnosis not present

## 2015-05-16 DIAGNOSIS — M1712 Unilateral primary osteoarthritis, left knee: Secondary | ICD-10-CM | POA: Diagnosis not present

## 2015-05-20 ENCOUNTER — Ambulatory Visit: Payer: Medicare Other | Admitting: Internal Medicine

## 2015-05-21 DIAGNOSIS — M79642 Pain in left hand: Secondary | ICD-10-CM | POA: Diagnosis not present

## 2015-05-21 DIAGNOSIS — M79641 Pain in right hand: Secondary | ICD-10-CM | POA: Diagnosis not present

## 2015-05-21 DIAGNOSIS — G894 Chronic pain syndrome: Secondary | ICD-10-CM | POA: Diagnosis not present

## 2015-05-21 DIAGNOSIS — Z79899 Other long term (current) drug therapy: Secondary | ICD-10-CM | POA: Diagnosis not present

## 2015-05-22 DIAGNOSIS — G609 Hereditary and idiopathic neuropathy, unspecified: Secondary | ICD-10-CM | POA: Diagnosis not present

## 2015-06-04 DIAGNOSIS — M25562 Pain in left knee: Secondary | ICD-10-CM | POA: Diagnosis not present

## 2015-06-04 DIAGNOSIS — M1712 Unilateral primary osteoarthritis, left knee: Secondary | ICD-10-CM | POA: Diagnosis not present

## 2015-06-05 NOTE — H&P (Signed)
TOTAL KNEE ADMISSION H&P  Patient is being admitted for left total knee arthroplasty.  Subjective:  Chief Complaint:    Left knee primary OA / pain  HPI: Rhonda Henry, 70 y.o. female, has a history of pain and functional disability in the left knee due to arthritis and has failed non-surgical conservative treatments for greater than 12 weeks to include NSAID's and/or analgesics, corticosteriod injections and activity modification.  Onset of symptoms was abrupt, starting 6 month ago with rapidlly worsening course since that time. The patient noted no past surgery on the left knee(s).  Patient currently rates pain in the left knee(s) at 9 out of 10 with activity. Patient has night pain, worsening of pain with activity and weight bearing, pain that interferes with activities of daily living, pain with passive range of motion, crepitus and joint swelling.  Patient has evidence of periarticular osteophytes and joint space narrowing by imaging studies. There is no active infection.  Risks, benefits and expectations were discussed with the patient.  Risks including but not limited to the risk of anesthesia, blood clots, nerve damage, blood vessel damage, failure of the prosthesis, infection and up to and including death.  Patient understand the risks, benefits and expectations and wishes to proceed with surgery.   PCP: Hoyt Koch, MD  D/C Plans:      Home  with OPPT  Post-op Meds:       No Rx given  Tranexamic Acid:      To be given - IV   Decadron:      Is to be given  FYI:     ASA  Norco  Given Rx for DME to be obtained prior to surgery    Patient Active Problem List   Diagnosis Date Noted  . At high risk for cardiac arrhythmia 02/28/2015  . Numbness and tingling in left arm 02/26/2015  . Rectal bleeding 06/12/2014  . Constipation 06/12/2014  . Thrush 02/15/2014  . Allergic rhinitis 02/15/2014  . Dental disease 11/25/2013  . Obesity 09/02/2013  . Multinodular goiter  02/26/2013  . Liver hemangioma 08/09/2012  . Hematuria 08/09/2012  . Lumbar herniated disc 05/02/2012  . 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 VITAMIN D DEFICIENCY 05/26/2009  . Essential hypertension 07/30/2006  . UTI'S, HX OF 07/30/2006   Past Medical History  Diagnosis Date  . Arthritis   . Complication of anesthesia     "I shake like crazy"  . Recurrent UTI   . Microscopic hematuria      x 30 yrs   . Anxiety   . Difficulty sleeping     no issues now  . Abdominal pain, acute, right upper quadrant     had gall bladder removed and now no issues   . Bloating   . Constipation     not now  . Nausea   . Depression     no issues currently   . Neuromuscular disorder ([place])   . Nerve damage     face -1 yr ago and now almost resolved     Past Surgical History  Procedure Laterality Date  . Facial cosmetic surgery  7-10    compl by thick scars and a scar necrosis on R cheek  . Breast biopsies    . Breast lumpectomy      x 2  . Refractive surgery  1997  . Lumbar epidural injection  Jan 15   . Cyst excision  removed from back and left arm  . C sections      x2  . Cholecystectomy N/A 03/13/2013    Procedure: LAPAROSCOPIC CHOLECYSTECTOMY WITH INTRAOPERATIVE CHOLANGIOGRAM;  Surgeon: [name]. [name], MD;  Location: [facility];  Service: General;  Laterality: N/A;    No prescriptions prior to admission   Allergies  Allergen Reactions  . Other Other (See Comments)    Glue - may cause hives?  . Desipramine Hcl Rash  . Penicillins Hives and Rash     Has patient had a PCN reaction causing immediate rash, facial/tongue/throat swelling, SOB or lightheadedness with hypotension: Yes Has patient had a PCN reaction causing severe rash involving mucus membranes or skin necrosis: Yes Has patient had a PCN reaction that required hospitalization No Has patient had a PCN reaction occurring within the last 10 years: No If  all of the above answers are "NO", then may proceed with Cephalosporin use.   . Tape Hives    Social History  Substance Use Topics  . Smoking status: Never Smoker   . Smokeless tobacco: Never Used  . Alcohol Use: 0.0 oz/week    0 Standard drinks or equivalent per week     Comment: socially 1-2 glasses weekly    Family History  Problem Relation Age of Onset  . Hypertension Other   . Thyroid cancer Mother   . Heart disease Mother   . Macular degeneration Mother   . Stroke Mother   . Hypertension Mother   . Glaucoma Father   . Colon cancer Neg Hx      Review of Systems  Constitutional: Negative.   HENT: Negative.   Eyes: Negative.   Respiratory: Negative.   Cardiovascular: Negative.   Gastrointestinal: Positive for heartburn and constipation.  Genitourinary: Negative.   Musculoskeletal: Positive for joint pain.  Skin: Negative.   Neurological: Negative.   Endo/Heme/Allergies: Positive for environmental allergies.  Psychiatric/Behavioral: Positive for depression. The patient is nervous/anxious and has insomnia.     Objective:  Physical Exam  Constitutional: She is oriented to person, place, and time. She appears well-developed.  HENT:  Head: Normocephalic.  Eyes: Pupils are equal, round, and reactive to light.  Neck: Neck supple. No JVD present. No tracheal deviation present. No thyromegaly present.  Cardiovascular: Normal rate, regular rhythm and intact distal pulses.   Murmur heard. Respiratory: Effort normal and breath sounds normal. No stridor. No respiratory distress. She has no wheezes.  GI: Soft. There is no tenderness. There is no guarding.  Musculoskeletal:       Left knee: She exhibits decreased range of motion, swelling and bony tenderness. She exhibits no ecchymosis, no deformity, no laceration and no erythema. Tenderness found.  Lymphadenopathy:    She has no cervical adenopathy.  Neurological: She is alert and oriented to person, place, and time. A  sensory deficit (Left LE numbness;  Right UE numbness in arm and hand) is present.  Skin: Skin is warm and dry.  Psychiatric: She has a normal mood and affect.      Labs:  Estimated body mass index is 35.16 kg/(m^2) as calculated from the following:   Height as of 02/26/15: 5\' 1"  (1.549 m).   Weight as of 02/28/15: 84.369 kg (186 lb).   Imaging Review Plain radiographs demonstrate severe degenerative joint disease of the left knee(s).  The bone quality appears to be good for age and reported activity level.  Assessment/Plan:  End stage arthritis, left knee   The patient history, physical examination,  clinical judgment of the provider and imaging studies are consistent with end stage degenerative joint disease of the left knee(s) and total knee arthroplasty is deemed medically necessary. The treatment options including medical management, injection therapy arthroscopy and arthroplasty were discussed at length. The risks and benefits of total knee arthroplasty were presented and reviewed. The risks due to aseptic loosening, infection, stiffness, patella tracking problems, thromboembolic complications and other imponderables were discussed. The patient acknowledged the explanation, agreed to proceed with the plan and consent was signed. Patient is being admitted for inpatient treatment for surgery, pain control, PT, OT, prophylactic antibiotics, VTE prophylaxis, progressive ambulation and ADL's and discharge planning. The patient is planning to be discharged home with home health services.    [place] Pugh Miral Hoopes   PA-C  06/05/2015, 8:54 AM

## 2015-06-06 ENCOUNTER — Encounter: Payer: Self-pay | Admitting: Internal Medicine

## 2015-06-06 ENCOUNTER — Ambulatory Visit (INDEPENDENT_AMBULATORY_CARE_PROVIDER_SITE_OTHER): Payer: Medicare Other | Admitting: Internal Medicine

## 2015-06-06 ENCOUNTER — Telehealth: Payer: Self-pay

## 2015-06-06 VITALS — BP 132/82 | HR 89 | Temp 98.7°F | Resp 14 | Ht 61.0 in | Wt 185.0 lb

## 2015-06-06 DIAGNOSIS — Z0000 Encounter for general adult medical examination without abnormal findings: Secondary | ICD-10-CM

## 2015-06-06 DIAGNOSIS — Z23 Encounter for immunization: Secondary | ICD-10-CM

## 2015-06-06 MED ORDER — LIDOCAINE 5 % EX PTCH: 1.0000 | MEDICATED_PATCH | Freq: Every day | CUTANEOUS | Status: DC | PRN

## 2015-06-06 NOTE — Assessment & Plan Note (Signed)
Recent labs reviewed and no indication for more today. She has had bone density many years ago and does not wish another at this time. Reports it was normal. Declines hep c screening today. Given prevnar 13 to update immunizations. Colonoscopy due later this year and reminded. Counseled about the need for exercise to keep her healthy. Given 10 year screening recommendations.

## 2015-06-06 NOTE — Telephone Encounter (Signed)
DENIED - Lidoderm patch is not approved by Medicare Part D as FDA listed treatment of Intervertebral Disc Displacement, Lumbar Region.

## 2015-06-06 NOTE — Patient Instructions (Signed)
We have given you the prevnar 13 shot today and we are not changing the medicines today.   Health Maintenance, Female Adopting a healthy lifestyle and getting preventive care can go a long way to promote health and wellness. Talk with your health care provider about what schedule of regular examinations is right for you. This is a good chance for you to check in with your provider about disease prevention and staying healthy. In between checkups, there are plenty of things you can do on your own. Experts have done a lot of research about which lifestyle changes and preventive measures are most likely to keep you healthy. Ask your health care provider for more information. WEIGHT AND DIET  Eat a healthy diet  Be sure to include plenty of vegetables, fruits, low-fat dairy products, and lean protein.  Do not eat a lot of foods high in solid fats, added sugars, or salt.  Get regular exercise. This is one of the most important things you can do for your health.  Most adults should exercise for at least 150 minutes each week. The exercise should increase your heart rate and make you sweat (moderate-intensity exercise).  Most adults should also do strengthening exercises at least twice a week. This is in addition to the moderate-intensity exercise.  Maintain a healthy weight  Body mass index (BMI) is a measurement that can be used to identify possible weight problems. It estimates body fat based on height and weight. Your health care provider can help determine your BMI and help you achieve or maintain a healthy weight.  For females 6 years of age and older:   A BMI below 18.5 is considered underweight.  A BMI of 18.5 to 24.9 is normal.  A BMI of 25 to 29.9 is considered overweight.  A BMI of 30 and above is considered obese.  Watch levels of cholesterol and blood lipids  You should start having your blood tested for lipids and cholesterol at 70 years of age, then have this test every 5  years.  You may need to have your cholesterol levels checked more often if:  Your lipid or cholesterol levels are high.  You are older than 70 years of age.  You are at high risk for heart disease.  CANCER SCREENING   Lung Cancer  Lung cancer screening is recommended for adults 88-68 years old who are at high risk for lung cancer because of a history of smoking.  A yearly low-dose CT scan of the lungs is recommended for people who:  Currently smoke.  Have quit within the past 15 years.  Have at least a 30-pack-year history of smoking. A pack year is smoking an average of one pack of cigarettes a day for 1 year.  Yearly screening should continue until it has been 15 years since you quit.  Yearly screening should stop if you develop a health problem that would prevent you from having lung cancer treatment.  Breast Cancer  Practice breast self-awareness. This means understanding how your breasts normally appear and feel.  It also means doing regular breast self-exams. Let your health care provider know about any changes, no matter how small.  If you are in your 20s or 30s, you should have a clinical breast exam (CBE) by a health care provider every 1-3 years as part of a regular health exam.  If you are 98 or older, have a CBE every year. Also consider having a breast X-ray (mammogram) every year.  If you  have a family history of breast cancer, talk to your health care provider about genetic screening.  If you are at high risk for breast cancer, talk to your health care provider about having an MRI and a mammogram every year.  Breast cancer gene (BRCA) assessment is recommended for women who have family members with BRCA-related cancers. BRCA-related cancers include:  Breast.  Ovarian.  Tubal.  Peritoneal cancers.  Results of the assessment will determine the need for genetic counseling and BRCA1 and BRCA2 testing. Cervical Cancer Your health care provider may  recommend that you be screened regularly for cancer of the pelvic organs (ovaries, uterus, and vagina). This screening involves a pelvic examination, including checking for microscopic changes to the surface of your cervix (Pap test). You may be encouraged to have this screening done every 3 years, beginning at age 50.  For women ages 72-65, health care providers may recommend pelvic exams and Pap testing every 3 years, or they may recommend the Pap and pelvic exam, combined with testing for human papilloma virus (HPV), every 5 years. Some types of HPV increase your risk of cervical cancer. Testing for HPV may also be done on women of any age with unclear Pap test results.  Other health care providers may not recommend any screening for nonpregnant women who are considered low risk for pelvic cancer and who do not have symptoms. Ask your health care provider if a screening pelvic exam is right for you.  If you have had past treatment for cervical cancer or a condition that could lead to cancer, you need Pap tests and screening for cancer for at least 20 years after your treatment. If Pap tests have been discontinued, your risk factors (such as having a new sexual partner) need to be reassessed to determine if screening should resume. Some women have medical problems that increase the chance of getting cervical cancer. In these cases, your health care provider may recommend more frequent screening and Pap tests. Colorectal Cancer  This type of cancer can be detected and often prevented.  Routine colorectal cancer screening usually begins at 70 years of age and continues through 70 years of age.  Your health care provider may recommend screening at an earlier age if you have risk factors for colon cancer.  Your health care provider may also recommend using home test kits to check for hidden blood in the stool.  A small camera at the end of a tube can be used to examine your colon directly  (sigmoidoscopy or colonoscopy). This is done to check for the earliest forms of colorectal cancer.  Routine screening usually begins at age 43.  Direct examination of the colon should be repeated every 5-10 years through 70 years of age. However, you may need to be screened more often if early forms of precancerous polyps or small growths are found. Skin Cancer  Check your skin from head to toe regularly.  Tell your health care provider about any new moles or changes in moles, especially if there is a change in a mole's shape or color.  Also tell your health care provider if you have a mole that is larger than the size of a pencil eraser.  Always use sunscreen. Apply sunscreen liberally and repeatedly throughout the day.  Protect yourself by wearing long sleeves, pants, a wide-brimmed hat, and sunglasses whenever you are outside. HEART DISEASE, DIABETES, AND HIGH BLOOD PRESSURE   High blood pressure causes heart disease and increases the risk of stroke.  High blood pressure is more likely to develop in:  People who have blood pressure in the high end of the normal range (130-139/85-89 mm Hg).  People who are overweight or obese.  People who are African American.  If you are 42-104 years of age, have your blood pressure checked every 3-5 years. If you are 60 years of age or older, have your blood pressure checked every year. You should have your blood pressure measured twice--once when you are at a hospital or clinic, and once when you are not at a hospital or clinic. Record the average of the two measurements. To check your blood pressure when you are not at a hospital or clinic, you can use:  An automated blood pressure machine at a pharmacy.  A home blood pressure monitor.  If you are between 18 years and 59 years old, ask your health care provider if you should take aspirin to prevent strokes.  Have regular diabetes screenings. This involves taking a blood sample to check your  fasting blood sugar level.  If you are at a normal weight and have a low risk for diabetes, have this test once every three years after 70 years of age.  If you are overweight and have a high risk for diabetes, consider being tested at a younger age or more often. PREVENTING INFECTION  Hepatitis B  If you have a higher risk for hepatitis B, you should be screened for this virus. You are considered at high risk for hepatitis B if:  You were born in a country where hepatitis B is common. Ask your health care provider which countries are considered high risk.  Your parents were born in a high-risk country, and you have not been immunized against hepatitis B (hepatitis B vaccine).  You have HIV or AIDS.  You use needles to inject street drugs.  You live with someone who has hepatitis B.  You have had sex with someone who has hepatitis B.  You get hemodialysis treatment.  You take certain medicines for conditions, including cancer, organ transplantation, and autoimmune conditions. Hepatitis C  Blood testing is recommended for:  Everyone born from [birth year] through 1965.  Anyone with known risk factors for hepatitis C. Sexually transmitted infections (STIs)  You should be screened for sexually transmitted infections (STIs) including gonorrhea and chlamydia if:  You are sexually active and are younger than 70 years of age.  You are older than 70 years of age and your health care provider tells you that you are at risk for this type of infection.  Your sexual activity has changed since you were last screened and you are at an increased risk for chlamydia or gonorrhea. Ask your health care provider if you are at risk.  If you do not have HIV, but are at risk, it may be recommended that you take a prescription medicine daily to prevent HIV infection. This is called pre-exposure prophylaxis (PrEP). You are considered at risk if:  You are sexually active and do not regularly use condoms or  know the HIV status of your partner(s).  You take drugs by injection.  You are sexually active with a partner who has HIV. Talk with your health care provider about whether you are at high risk of being infected with HIV. If you choose to begin PrEP, you should first be tested for HIV. You should then be tested every 3 months for as long as you are taking PrEP.  PREGNANCY   If you  are premenopausal and you may become pregnant, ask your health care provider about preconception counseling.  If you may become pregnant, take 400 to 800 micrograms (mcg) of folic acid every day.  If you want to prevent pregnancy, talk to your health care provider about birth control (contraception). OSTEOPOROSIS AND MENOPAUSE   Osteoporosis is a disease in which the bones lose minerals and strength with aging. This can result in serious bone fractures. Your risk for osteoporosis can be identified using a bone density scan.  If you are 38 years of age or older, or if you are at risk for osteoporosis and fractures, ask your health care provider if you should be screened.  Ask your health care provider whether you should take a calcium or vitamin D supplement to lower your risk for osteoporosis.  Menopause may have certain physical symptoms and risks.  Hormone replacement therapy may reduce some of these symptoms and risks. Talk to your health care provider about whether hormone replacement therapy is right for you.  HOME CARE INSTRUCTIONS   Schedule regular health, dental, and eye exams.  Stay current with your immunizations.   Do not use any tobacco products including cigarettes, chewing tobacco, or electronic cigarettes.  If you are pregnant, do not drink alcohol.  If you are breastfeeding, limit how much and how often you drink alcohol.  Limit alcohol intake to no more than 1 drink per day for nonpregnant women. One drink equals 12 ounces of beer, 5 ounces of wine, or 1 ounces of hard liquor.  Do  not use street drugs.  Do not share needles.  Ask your health care provider for help if you need support or information about quitting drugs.  Tell your health care provider if you often feel depressed.  Tell your health care provider if you have ever been abused or do not feel safe at home.   This information is not intended to replace advice given to you by your health care provider. Make sure you discuss any questions you have with your health care provider.   Document Released: 07/06/2010 Document Revised: 01/11/2014 Document Reviewed: 11/22/2012 Elsevier Interactive Patient Education [employer].

## 2015-06-06 NOTE — Telephone Encounter (Signed)
PA initiated via CoverMyMeds key [id number]

## 2015-06-06 NOTE — Progress Notes (Signed)
   Subjective:    Patient ID: Rhonda Henry, female    DOB: 23-Aug-1945, 70 y.o.   MRN: [id number]  HPI Here for medicare wellness, no new complaints. Please see A/P for status and treatment of chronic medical problems.   Diet: heart healthy Physical activity: sedentary Depression/mood screen: negative Hearing: intact to whispered voice Visual acuity: grossly normal with lens, performs annual eye exam  ADLs: capable Fall risk: none Home safety: good Cognitive evaluation: intact to orientation, naming, recall and repetition EOL planning: adv directives discussed  I have personally reviewed and have noted 1. The patient's medical and social history - reviewed today no changes 2. Their use of alcohol, tobacco or illicit drugs 3. Their current medications and supplements 4. The patient's functional ability including ADL's, fall risks, home safety risks and hearing or visual impairment. 5. Diet and physical activities 6. Evidence for depression or mood disorders 7. Care team reviewed and updated (available in snapshot)  Review of Systems  Constitutional: Negative for fever, activity change, appetite change, fatigue and unexpected weight change.  HENT: Negative.   Eyes: Negative.   Respiratory: Negative for cough, chest tightness, shortness of breath and wheezing.   Cardiovascular: Negative for chest pain, palpitations and leg swelling.  Gastrointestinal: Negative for abdominal pain, diarrhea, constipation and abdominal distention.  Musculoskeletal: Positive for myalgias and arthralgias.  Skin: Negative.   Neurological: Positive for numbness. Negative for dizziness, weakness and headaches.  Psychiatric/Behavioral: Positive for sleep disturbance and dysphoric mood. Negative for suicidal ideas and self-injury.      Objective:   Physical Exam  Constitutional: She is oriented to person, place, and time. She appears well-developed and well-nourished.  HENT:  Head: Normocephalic and  atraumatic.  Eyes: EOM are normal.  Neck: Normal range of motion.  Cardiovascular: Normal rate and regular rhythm.   Pulmonary/Chest: Effort normal and breath sounds normal. She has no wheezes.  Abdominal: Bowel sounds are normal. She exhibits no distension. There is no tenderness. There is no rebound.  Musculoskeletal: She exhibits no edema.  Neurological: She is alert and oriented to person, place, and time. Coordination normal.  Skin: Skin is warm and dry.      Filed Vitals:   06/06/15 0809  BP: 132/82  Pulse: 89  Temp: 98.7 F (37.1 C)  TempSrc: Oral  Resp: 14  Height: 5\' 1"  (1.549 m)  Weight: 185 lb (83.915 kg)  SpO2: 95%   Assessment & Plan:  Prevnar 13 given at visit.

## 2015-06-06 NOTE — Progress Notes (Signed)
Pre visit review using our clinic review tool, if applicable. No additional management support is needed unless otherwise documented below in the visit note. 

## 2015-06-09 NOTE — Telephone Encounter (Signed)
Indication is trigeminal neuralgia.

## 2015-06-09 NOTE — Telephone Encounter (Signed)
PA updated and resent via CoverMyMeds Key [id number]

## 2015-06-10 NOTE — Telephone Encounter (Signed)
Updated PA with Dx of Trigeminal Neuralgia DENIED - [employer] Medicare indicates that medication is only approved for use in Post Herpetic Neuralgia, Diabetic Neuropathy and Cancer/Chmo related Nerve Pain

## 2015-06-10 NOTE — Telephone Encounter (Signed)
Spoke with Rhonda Henry at [facility] [phone] and was advised that there are no exception on the covered medicare diagnosis for Lidoderm patches or its generic.  I also advised that per MD, pt has tried and failed OTC Lidocaine 4% patches but was again advised that appeal would be denied.

## 2015-06-10 NOTE — Telephone Encounter (Signed)
Okay, please call and inform patient. Can we request exception?

## 2015-06-10 NOTE — Telephone Encounter (Signed)
Ok thanks 

## 2015-06-10 NOTE — Patient Instructions (Signed)
Rhonda Henry  06/10/2015   Your procedure is scheduled on: 06/17/2015    Report to [facility] Main  Entrance take [place]  elevators to 3rd floor to  [place] at   [place] AM.  Call this number if you have problems the morning of surgery [phone]   Remember: ONLY 1 PERSON MAY GO WITH YOU TO SHORT STAY TO GET  READY MORNING OF YOUR SURGERY.  Do not eat food or drink liquids :After Midnight.     Take these medicines the morning of surgery with A SIP OF WATER: nexium                                 You may not have any metal on your body including hair pins and              piercings  Do not wear jewelry, make-up, lotions, powders or perfumes, deodorant             Do not wear nail polish.  Do not shave  48 hours prior to surgery.                Do not bring valuables to the hospital. [place].  Contacts, dentures or bridgework may not be worn into surgery.  Leave suitcase in the car. After surgery it may be brought to your room.         Special Instructions: coughing and deep breathing exercises, leg exercises               Please read over the following fact sheets you were given: _____________________________________________________________________             [facility] - Preparing for Surgery Before surgery, you can play an important role.  Because skin is not sterile, your skin needs to be as free of germs as possible.  You can reduce the number of germs on your skin by washing with CHG (chlorahexidine gluconate) soap before surgery.  CHG is an antiseptic cleaner which kills germs and bonds with the skin to continue killing germs even after washing. Please DO NOT use if you have an allergy to CHG or antibacterial soaps.  If your skin becomes reddened/irritated stop using the CHG and inform your nurse when you arrive at Short Stay. Do not shave (including legs and underarms) for at least 48  hours prior to the first CHG shower.  You may shave your face/neck. Please follow these instructions carefully:  1.  Shower with CHG Soap the night before surgery and the  morning of Surgery.  2.  If you choose to wash your hair, wash your hair first as usual with your  normal  shampoo.  3.  After you shampoo, rinse your hair and body thoroughly to remove the  shampoo.                           4.  Use CHG as you would any other liquid soap.  You can apply chg directly  to the skin and wash                       Gently  with a scrungie or clean washcloth.  5.  Apply the CHG Soap to your body ONLY FROM THE NECK DOWN.   Do not use on face/ open                           Wound or open sores. Avoid contact with eyes, ears mouth and genitals (private parts).                       Wash face,  Genitals (private parts) with your normal soap.             6.  Wash thoroughly, paying special attention to the area where your surgery  will be performed.  7.  Thoroughly rinse your body with warm water from the neck down.  8.  DO NOT shower/wash with your normal soap after using and rinsing off  the CHG Soap.                9.  Pat yourself dry with a clean towel.            10.  Wear clean pajamas.            11.  Place clean sheets on your bed the night of your first shower and do not  sleep with pets. Day of Surgery : Do not apply any lotions/deodorants the morning of surgery.  Please wear clean clothes to the hospital/surgery center.  FAILURE TO FOLLOW THESE INSTRUCTIONS MAY RESULT IN THE CANCELLATION OF YOUR SURGERY PATIENT SIGNATURE_________________________________  NURSE SIGNATURE__________________________________  ________________________________________________________________________  WHAT IS A BLOOD TRANSFUSION? Blood Transfusion Information  A transfusion is the replacement of blood or some of its parts. Blood is made up of multiple cells which provide different functions.  Red blood cells  carry oxygen and are used for blood loss replacement.  White blood cells fight against infection.  Platelets control bleeding.  Plasma helps clot blood.  Other blood products are available for specialized needs, such as hemophilia or other clotting disorders. BEFORE THE TRANSFUSION  Who gives blood for transfusions?   Healthy volunteers who are fully evaluated to make sure their blood is safe. This is blood bank blood. Transfusion therapy is the safest it has ever been in the practice of medicine. Before blood is taken from a donor, a complete history is taken to make sure that person has no history of diseases nor engages in risky social behavior (examples are intravenous drug use or sexual activity with multiple partners). The donor's travel history is screened to minimize risk of transmitting infections, such as malaria. The donated blood is tested for signs of infectious diseases, such as HIV and hepatitis. The blood is then tested to be sure it is compatible with you in order to minimize the chance of a transfusion reaction. If you or a relative donates blood, this is often done in anticipation of surgery and is not appropriate for emergency situations. It takes many days to process the donated blood. RISKS AND COMPLICATIONS Although transfusion therapy is very safe and saves many lives, the main dangers of transfusion include:  1. Getting an infectious disease. 2. Developing a transfusion reaction. This is an allergic reaction to something in the blood you were given. Every precaution is taken to prevent this. The decision to have a blood transfusion has been considered carefully by your caregiver before blood is given. Blood is not given unless the benefits outweigh the  risks. AFTER THE TRANSFUSION  Right after receiving a blood transfusion, you will usually feel much better and more energetic. This is especially true if your red blood cells have gotten low (anemic). The transfusion  raises the level of the red blood cells which carry oxygen, and this usually causes an energy increase.  The nurse administering the transfusion will monitor you carefully for complications. HOME CARE INSTRUCTIONS  No special instructions are needed after a transfusion. You may find your energy is better. Speak with your caregiver about any limitations on activity for underlying diseases you may have. SEEK MEDICAL CARE IF:   Your condition is not improving after your transfusion.  You develop redness or irritation at the intravenous (IV) site. SEEK IMMEDIATE MEDICAL CARE IF:  Any of the following symptoms occur over the next 12 hours:  Shaking chills.  You have a temperature by mouth above 102 F (38.9 C), not controlled by medicine.  Chest, back, or muscle pain.  People around you feel you are not acting correctly or are confused.  Shortness of breath or difficulty breathing.  Dizziness and fainting.  You get a rash or develop hives.  You have a decrease in urine output.  Your urine turns a dark color or changes to pink, red, or brown. Any of the following symptoms occur over the next 10 days:  You have a temperature by mouth above 102 F (38.9 C), not controlled by medicine.  Shortness of breath.  Weakness after normal activity.  The white part of the eye turns yellow (jaundice).  You have a decrease in the amount of urine or are urinating less often.  Your urine turns a dark color or changes to pink, red, or brown. Document Released: 12/19/1999 Document Revised: 03/15/2011 Document Reviewed: 08/07/2007 ExitCare Patient Information 2014 [place].  _______________________________________________________________________  Incentive Spirometer  An incentive spirometer is a tool that can help keep your lungs clear and active. This tool measures how well you are filling your lungs with each breath. Taking long deep breaths may help reverse or decrease the chance  of developing breathing (pulmonary) problems (especially infection) following:  A long period of time when you are unable to move or be active. BEFORE THE PROCEDURE   If the spirometer includes an indicator to show your best effort, your nurse or respiratory therapist will set it to a desired goal.  If possible, sit up straight or lean slightly forward. Try not to slouch.  Hold the incentive spirometer in an upright position. INSTRUCTIONS FOR USE  3. Sit on the edge of your bed if possible, or sit up as far as you can in bed or on a chair. 4. Hold the incentive spirometer in an upright position. 5. Breathe out normally. 6. Place the mouthpiece in your mouth and seal your lips tightly around it. 7. Breathe in slowly and as deeply as possible, raising the piston or the ball toward the top of the column. 8. Hold your breath for 3-5 seconds or for as long as possible. Allow the piston or ball to fall to the bottom of the column. 9. Remove the mouthpiece from your mouth and breathe out normally. 10. Rest for a few seconds and repeat Steps 1 through 7 at least 10 times every 1-2 hours when you are awake. Take your time and take a few normal breaths between deep breaths. 11. The spirometer may include an indicator to show your best effort. Use the indicator as a goal to work toward  during each repetition. 12. After each set of 10 deep breaths, practice coughing to be sure your lungs are clear. If you have an incision (the cut made at the time of surgery), support your incision when coughing by placing a pillow or rolled up towels firmly against it. Once you are able to get out of bed, walk around indoors and cough well. You may stop using the incentive spirometer when instructed by your caregiver.  RISKS AND COMPLICATIONS  Take your time so you do not get dizzy or light-headed.  If you are in pain, you may need to take or ask for pain medication before doing incentive spirometry. It is harder to  take a deep breath if you are having pain. AFTER USE  Rest and breathe slowly and easily.  It can be helpful to keep track of a log of your progress. Your caregiver can provide you with a simple table to help with this. If you are using the spirometer at home, follow these instructions: [place] IF:   You are having difficultly using the spirometer.  You have trouble using the spirometer as often as instructed.  Your pain medication is not giving enough relief while using the spirometer.  You develop fever of 100.5 F (38.1 C) or higher. SEEK IMMEDIATE MEDICAL CARE IF:   You cough up bloody sputum that had not been present before.  You develop fever of 102 F (38.9 C) or greater.  You develop worsening pain at or near the incision site. MAKE SURE YOU:   Understand these instructions.  Will watch your condition.  Will get help right away if you are not doing well or get worse. Document Released: 05/03/2006 Document Revised: 03/15/2011 Document Reviewed: 07/04/2006 [facility] Patient Information 2014 [place], [place].   ________________________________________________________________________

## 2015-06-11 ENCOUNTER — Encounter (HOSPITAL_COMMUNITY): Payer: Self-pay

## 2015-06-11 ENCOUNTER — Encounter (HOSPITAL_COMMUNITY)
Admission: RE | Admit: 2015-06-11 | Discharge: 2015-06-11 | Disposition: A | Discharge status: Home / Self Care | Payer: Medicare Other | Source: Ambulatory Visit | Attending: Orthopedic Surgery | Admitting: Orthopedic Surgery

## 2015-06-11 DIAGNOSIS — Z01812 Encounter for preprocedural laboratory examination: Secondary | ICD-10-CM | POA: Diagnosis not present

## 2015-06-11 DIAGNOSIS — M1712 Unilateral primary osteoarthritis, left knee: Secondary | ICD-10-CM | POA: Insufficient documentation

## 2015-06-11 DIAGNOSIS — Z0183 Encounter for blood typing: Secondary | ICD-10-CM | POA: Diagnosis not present

## 2015-06-11 HISTORY — DX: Gastro-esophageal reflux disease without esophagitis: K21.9

## 2015-06-11 HISTORY — DX: Essential (primary) hypertension: I10

## 2015-06-11 HISTORY — DX: Cardiac murmur, unspecified: R01.1

## 2015-06-11 LAB — BASIC METABOLIC PANEL WITH GFR
Anion gap: 6 (ref 5–15)
BUN: 19 mg/dL (ref 6–20)
CO2: 29 mmol/L (ref 22–32)
Calcium: 9.6 mg/dL (ref 8.9–10.3)
Chloride: 106 mmol/L (ref 101–111)
Creatinine, Ser: 0.62 mg/dL (ref 0.44–1.00)
GFR calc Af Amer: >60 mL/min
GFR calc non Af Amer: >60 mL/min
Glucose, Bld: 80 mg/dL (ref 65–99)
Potassium: 4.2 mmol/L (ref 3.5–5.1)
Sodium: 141 mmol/L (ref 135–145)

## 2015-06-11 LAB — CBC
HCT: 40.7 % (ref 36.0–46.0)
Hemoglobin: 13.1 g/dL (ref 12.0–15.0)
MCH: 26.8 pg (ref 26.0–34.0)
MCHC: 32.2 g/dL (ref 30.0–36.0)
MCV: 83.4 fL (ref 78.0–100.0)
Platelets: 297 K/uL (ref 150–400)
RBC: 4.88 MIL/uL (ref 3.87–5.11)
RDW: 13.6 % (ref 11.5–15.5)
WBC: 6.3 K/uL (ref 4.0–10.5)

## 2015-06-11 LAB — SURGICAL PCR SCREEN
MRSA, PCR: NEGATIVE
Staphylococcus aureus: NEGATIVE

## 2015-06-11 LAB — ABO/RH: ABO/RH(D): O POS

## 2015-06-11 NOTE — Progress Notes (Signed)
EKG-02/28/15- EPIC  5/276/2016- LOV- Cardiology- EPIC  Clearance- 05/31/14 on chart  Clearance- DR Sharlet Salina 02/26/15- on chart

## 2015-06-14 ENCOUNTER — Telehealth: Payer: Self-pay | Admitting: Internal Medicine

## 2015-06-14 ENCOUNTER — Encounter: Payer: Self-pay | Admitting: Family Medicine

## 2015-06-14 ENCOUNTER — Ambulatory Visit (INDEPENDENT_AMBULATORY_CARE_PROVIDER_SITE_OTHER): Payer: Medicare Other | Admitting: Family Medicine

## 2015-06-14 VITALS — BP 140/80 | HR 85 | Temp 98.5°F | Resp 20 | Ht 61.0 in | Wt 184.5 lb

## 2015-06-14 DIAGNOSIS — Z889 Allergy status to unspecified drugs, medicaments and biological substances status: Secondary | ICD-10-CM | POA: Diagnosis not present

## 2015-06-14 DIAGNOSIS — L03114 Cellulitis of left upper limb: Secondary | ICD-10-CM

## 2015-06-14 DIAGNOSIS — W57XXXA Bitten or stung by nonvenomous insect and other nonvenomous arthropods, initial encounter: Secondary | ICD-10-CM

## 2015-06-14 DIAGNOSIS — T148 Other injury of unspecified body region: Secondary | ICD-10-CM | POA: Diagnosis not present

## 2015-06-14 DIAGNOSIS — T7840XA Allergy, unspecified, initial encounter: Secondary | ICD-10-CM

## 2015-06-14 MED ORDER — DOXYCYCLINE HYCLATE 100 MG PO TABS: 100.0000 mg | ORAL_TABLET | Freq: Two times a day (BID) | ORAL | Status: DC

## 2015-06-14 NOTE — Progress Notes (Signed)
   Subjective:    Patient ID: Rhonda Henry, female    DOB: [birth year], 70 y.o.   MRN: [id number]  HPI  70 year old female presents following PCV13 injection on 06/06/2015.  24 hours later noted small red area that disappeared. Also same day she had bed delivered. She reports   Appearance of several raise red lesions in right anterior upper arm. Very itchy, applying cortisone cream  Then new area of redness and soreness in left deltoid where injection was appeared this AM. Not spreading, mildly warm.   She feels tired and mild sore throat. No fever, no N/V/D/V.  No new body aches or flu like symptoms.   Social History /Family History/Past Medical History reviewed and updated if needed.   Review of Systems  Constitutional: Negative for fever and fatigue.  HENT: Negative for ear pain.   Eyes: Negative for pain.  Respiratory: Negative for chest tightness and shortness of breath.   Cardiovascular: Negative for chest pain, palpitations and leg swelling.  Gastrointestinal: Negative for abdominal pain.  Genitourinary: Negative for dysuria.       Objective:   Physical Exam  Constitutional: Vital signs are normal. She appears well-developed and well-nourished. She is cooperative.  Non-toxic appearance. She does not appear ill. No distress.  HENT:  Head: Normocephalic.  Right Ear: Hearing, tympanic membrane, external ear and ear canal normal. Tympanic membrane is not erythematous, not retracted and not bulging.  Left Ear: Hearing, tympanic membrane, external ear and ear canal normal. Tympanic membrane is not erythematous, not retracted and not bulging.  Nose: No mucosal edema or rhinorrhea. Right sinus exhibits no maxillary sinus tenderness and no frontal sinus tenderness. Left sinus exhibits no maxillary sinus tenderness and no frontal sinus tenderness.  Mouth/Throat: Uvula is midline, oropharynx is clear and moist and mucous membranes are normal.  Eyes: Conjunctivae, EOM and lids are  normal. Pupils are equal, round, and reactive to light. Lids are everted and swept, no foreign bodies found.  Neck: Trachea normal and normal range of motion. Neck supple. Carotid bruit is not present. No thyroid mass and no thyromegaly present.  Cardiovascular: Normal rate, regular rhythm, S1 normal, S2 normal, normal heart sounds, intact distal pulses and normal pulses.  Exam reveals no gallop and no friction rub.   No murmur heard. Pulmonary/Chest: Effort normal and breath sounds normal. No tachypnea. No respiratory distress. She has no decreased breath sounds. She has no wheezes. She has no rhonchi. She has no rales.  Abdominal: Soft. Normal appearance and bowel sounds are normal. There is no tenderness.  Neurological: She is alert.  Skin: Skin is warm, dry and intact. No rash noted.  4 dar red, papule on right upper arm, no warmth  Left upper deltoid with ttp and well circumscribed raised macule 3 cm x 3 cm, no streaking, but smaller patch medial with irregular edges, red, warm.  Psychiatric: Her speech is normal and behavior is normal. Judgment and thought content normal. Her mood appears not anxious. Cognition and memory are normal. She does not exhibit a depressed mood.          Assessment & Plan:

## 2015-06-14 NOTE — Progress Notes (Signed)
Pre visit review using our clinic review tool, if applicable. No additional management support is needed unless otherwise documented below in the visit note. 

## 2015-06-14 NOTE — Telephone Encounter (Signed)
Pt came in to be seen at [facility]. Pt brought in forms for an appeal for a medication from her insurance company for PCP. Placed forms on Tammy's desk for forwarding to pt's PCP.

## 2015-06-14 NOTE — Patient Instructions (Signed)
Complete course of antibiotics for possible mild cellulitis after  Prevnar injection.  Use antihistamine to  cover possibility of mild local allergic reaction to Prevnar.  Call if redness spreading or fever.  For bites on right arm.. Continue topical steroid over the counter.

## 2015-06-14 NOTE — Assessment & Plan Note (Signed)
Apply topical steroid to rash on right upper arm.

## 2015-06-14 NOTE — Assessment & Plan Note (Signed)
Given upcoming surgery... willcover for possible bacterial infeciton at site of vaccine.

## 2015-06-14 NOTE — Assessment & Plan Note (Signed)
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  pt refuses steroid inj or oral course given past SE. No red flags or suggestion of anaphylactic reaction.  Use antihistimine.

## 2015-06-16 NOTE — Anesthesia Preprocedure Evaluation (Addendum)
Anesthesia Evaluation  Patient identified by MRN, date of birth, ID band Patient awake    Reviewed: Allergy & Precautions, H&P , NPO status , Patient's Chart, lab work & pertinent test results  History of Anesthesia Complications Negative for: history of anesthetic complications  Airway Mallampati: II  TM Distance: >3 FB Neck ROM: Full    Dental no notable dental hx. (+) Teeth Intact, Dental Advisory Given   Pulmonary neg pulmonary ROS,    Pulmonary exam normal breath sounds clear to auscultation       Cardiovascular hypertension, Pt. on medications negative cardio ROS Normal cardiovascular exam Rhythm:Regular Rate:Normal     Neuro/Psych Anxiety Depression negative neurological ROS  negative psych ROS   GI/Hepatic negative GI ROS, Neg liver ROS, GERD  Medicated and Controlled,  Endo/Other  negative endocrine ROS  Renal/GU negative Renal ROS  negative genitourinary   Musculoskeletal negative musculoskeletal ROS (+)   Abdominal   Peds negative pediatric ROS (+)  Hematology negative hematology ROS (+)   Anesthesia Other Findings   Reproductive/Obstetrics negative OB ROS                            Anesthesia Physical Anesthesia Plan  ASA: II  Anesthesia Plan: Spinal   Post-op Pain Management:    Induction:   Airway Management Planned:   Additional Equipment:   Intra-op Plan:   Post-operative Plan:   Informed Consent:   Plan Discussed with:   Anesthesia Plan Comments:         Anesthesia Quick Evaluation

## 2015-06-17 ENCOUNTER — Inpatient Hospital Stay (HOSPITAL_COMMUNITY): Payer: Medicare Other | Admitting: Anesthesiology

## 2015-06-17 ENCOUNTER — Encounter (HOSPITAL_COMMUNITY): Payer: Self-pay

## 2015-06-17 ENCOUNTER — Encounter (HOSPITAL_COMMUNITY)
Admission: RE | Disposition: A | Discharge status: Home / Self Care | Payer: Self-pay | Source: Ambulatory Visit | Attending: Orthopedic Surgery

## 2015-06-17 ENCOUNTER — Inpatient Hospital Stay (HOSPITAL_COMMUNITY)
Admission: RE | Admit: 2015-06-17 | Discharge: 2015-06-18 | DRG: 470 | Disposition: A | Discharge status: Home / Self Care | Payer: Medicare Other | Source: Ambulatory Visit | Attending: Orthopedic Surgery | Admitting: Orthopedic Surgery

## 2015-06-17 DIAGNOSIS — Z9109 Other allergy status, other than to drugs and biological substances: Secondary | ICD-10-CM | POA: Diagnosis not present

## 2015-06-17 DIAGNOSIS — M659 Synovitis and tenosynovitis, unspecified: Secondary | ICD-10-CM | POA: Diagnosis present

## 2015-06-17 DIAGNOSIS — E669 Obesity, unspecified: Secondary | ICD-10-CM | POA: Diagnosis present

## 2015-06-17 DIAGNOSIS — Z880 Allergy status to penicillin: Secondary | ICD-10-CM | POA: Diagnosis not present

## 2015-06-17 DIAGNOSIS — M25462 Effusion, left knee: Secondary | ICD-10-CM | POA: Diagnosis not present

## 2015-06-17 DIAGNOSIS — Z6834 Body mass index (BMI) 34.0-34.9, adult: Secondary | ICD-10-CM

## 2015-06-17 DIAGNOSIS — Z87440 Personal history of urinary (tract) infections: Secondary | ICD-10-CM | POA: Diagnosis not present

## 2015-06-17 DIAGNOSIS — I10 Essential (primary) hypertension: Secondary | ICD-10-CM | POA: Diagnosis present

## 2015-06-17 DIAGNOSIS — Z8249 Family history of ischemic heart disease and other diseases of the circulatory system: Secondary | ICD-10-CM | POA: Diagnosis not present

## 2015-06-17 DIAGNOSIS — Z823 Family history of stroke: Secondary | ICD-10-CM | POA: Diagnosis not present

## 2015-06-17 DIAGNOSIS — Z888 Allergy status to other drugs, medicaments and biological substances status: Secondary | ICD-10-CM | POA: Diagnosis not present

## 2015-06-17 DIAGNOSIS — M1712 Unilateral primary osteoarthritis, left knee: Secondary | ICD-10-CM | POA: Diagnosis not present

## 2015-06-17 DIAGNOSIS — Z96659 Presence of unspecified artificial knee joint: Secondary | ICD-10-CM

## 2015-06-17 DIAGNOSIS — Z808 Family history of malignant neoplasm of other organs or systems: Secondary | ICD-10-CM

## 2015-06-17 DIAGNOSIS — M25762 Osteophyte, left knee: Secondary | ICD-10-CM | POA: Diagnosis present

## 2015-06-17 DIAGNOSIS — Z96652 Presence of left artificial knee joint: Secondary | ICD-10-CM

## 2015-06-17 DIAGNOSIS — M179 Osteoarthritis of knee, unspecified: Secondary | ICD-10-CM | POA: Diagnosis not present

## 2015-06-17 DIAGNOSIS — M25562 Pain in left knee: Secondary | ICD-10-CM | POA: Diagnosis not present

## 2015-06-17 HISTORY — PX: TOTAL KNEE ARTHROPLASTY: SHX125

## 2015-06-17 LAB — TYPE AND SCREEN
ABO/RH(D): O POS
ANTIBODY SCREEN: NEGATIVE

## 2015-06-17 SURGERY — ARTHROPLASTY, KNEE, TOTAL
Anesthesia: Spinal | Site: Knee | Laterality: Left

## 2015-06-17 MED ORDER — DEXAMETHASONE SODIUM PHOSPHATE 10 MG/ML IJ SOLN
10.0000 mg | Freq: Once | INTRAMUSCULAR | Status: DC
  Filled 2015-06-17: qty 1

## 2015-06-17 MED ORDER — DIAZEPAM 2 MG PO TABS
5.0000 mg | ORAL_TABLET | Freq: Two times a day (BID) | ORAL | Status: DC | PRN
  Administered 2015-06-18: 5 mg via ORAL
  Filled 2015-06-17 (×2): qty 3

## 2015-06-17 MED ORDER — METOCLOPRAMIDE HCL 5 MG/ML IJ SOLN
INTRAMUSCULAR | Status: DC | PRN
  Administered 2015-06-17: 10 mg via INTRAVENOUS

## 2015-06-17 MED ORDER — KETOROLAC TROMETHAMINE 30 MG/ML IJ SOLN
INTRAMUSCULAR | Status: AC
  Filled 2015-06-17: qty 1

## 2015-06-17 MED ORDER — DEXAMETHASONE SODIUM PHOSPHATE 10 MG/ML IJ SOLN
INTRAMUSCULAR | Status: DC | PRN
  Administered 2015-06-17: 10 mg via INTRAVENOUS

## 2015-06-17 MED ORDER — HYDROCODONE-ACETAMINOPHEN 7.5-325 MG PO TABS
1.0000 | ORAL_TABLET | ORAL | Status: DC
  Administered 2015-06-17 (×3): 1 via ORAL
  Administered 2015-06-17 – 2015-06-18 (×5): 2 via ORAL
  Filled 2015-06-17 (×5): qty 2
  Filled 2015-06-17: qty 1
  Filled 2015-06-17: qty 2

## 2015-06-17 MED ORDER — LORATADINE 10 MG PO TABS
10.0000 mg | ORAL_TABLET | Freq: Every day | ORAL | Status: DC
  Filled 2015-06-17 (×2): qty 1

## 2015-06-17 MED ORDER — DEXAMETHASONE SODIUM PHOSPHATE 10 MG/ML IJ SOLN: 10.0000 mg | Freq: Once | INTRAMUSCULAR | Status: DC

## 2015-06-17 MED ORDER — NORTRIPTYLINE HCL 10 MG PO CAPS
10.0000 mg | ORAL_CAPSULE | Freq: Every day | ORAL | Status: DC
  Filled 2015-06-17: qty 1

## 2015-06-17 MED ORDER — DOCUSATE SODIUM 100 MG PO CAPS
100.0000 mg | ORAL_CAPSULE | Freq: Two times a day (BID) | ORAL | Status: DC
  Administered 2015-06-17 – 2015-06-18 (×2): 100 mg via ORAL
  Filled 2015-06-17 (×3): qty 1

## 2015-06-17 MED ORDER — HYDROMORPHONE HCL 1 MG/ML IJ SOLN
0.2500 mg | INTRAMUSCULAR | Status: DC | PRN
  Administered 2015-06-17: 0.5 mg via INTRAVENOUS

## 2015-06-17 MED ORDER — FENTANYL CITRATE (PF) 100 MCG/2ML IJ SOLN
INTRAMUSCULAR | Status: AC
  Filled 2015-06-17: qty 2

## 2015-06-17 MED ORDER — SODIUM CHLORIDE 0.9 % IR SOLN
Status: DC | PRN
Start: 2015-06-17 — End: 2015-06-17
  Administered 2015-06-17: 1000 mL

## 2015-06-17 MED ORDER — VANCOMYCIN HCL IN DEXTROSE 1-5 GM/200ML-% IV SOLN
INTRAVENOUS | Status: AC
  Filled 2015-06-17: qty 200

## 2015-06-17 MED ORDER — LIDOCAINE HCL (CARDIAC) 20 MG/ML IV SOLN
INTRAVENOUS | Status: AC
  Filled 2015-06-17: qty 5

## 2015-06-17 MED ORDER — HYDROMORPHONE HCL 1 MG/ML IJ SOLN
INTRAMUSCULAR | Status: AC
  Filled 2015-06-17: qty 1

## 2015-06-17 MED ORDER — CELECOXIB 200 MG PO CAPS
200.0000 mg | ORAL_CAPSULE | Freq: Two times a day (BID) | ORAL | Status: DC
  Administered 2015-06-17 – 2015-06-18 (×2): 200 mg via ORAL
  Filled 2015-06-17 (×3): qty 1

## 2015-06-17 MED ORDER — LACTATED RINGERS IV SOLN: INTRAVENOUS | Status: DC

## 2015-06-17 MED ORDER — PROMETHAZINE HCL 25 MG PO TABS
12.5000 mg | ORAL_TABLET | Freq: Four times a day (QID) | ORAL | Status: DC | PRN
  Administered 2015-06-17: 12.5 mg via ORAL
  Filled 2015-06-17: qty 1

## 2015-06-17 MED ORDER — BISACODYL 10 MG RE SUPP: 10.0000 mg | Freq: Every day | RECTAL | Status: DC | PRN

## 2015-06-17 MED ORDER — ONDANSETRON HCL 4 MG/2ML IJ SOLN: 4.0000 mg | Freq: Four times a day (QID) | INTRAMUSCULAR | Status: DC | PRN

## 2015-06-17 MED ORDER — ONDANSETRON HCL 4 MG/2ML IJ SOLN
INTRAMUSCULAR | Status: AC
  Filled 2015-06-17: qty 2

## 2015-06-17 MED ORDER — POLYETHYLENE GLYCOL 3350 17 G PO PACK
17.0000 g | PACK | Freq: Two times a day (BID) | ORAL | Status: DC
  Administered 2015-06-18: 17 g via ORAL
  Filled 2015-06-17 (×3): qty 1

## 2015-06-17 MED ORDER — CHLORHEXIDINE GLUCONATE 4 % EX LIQD: 60.0000 mL | Freq: Once | CUTANEOUS | Status: DC

## 2015-06-17 MED ORDER — 0.9 % SODIUM CHLORIDE (POUR BTL) OPTIME
TOPICAL | Status: DC | PRN
  Administered 2015-06-17: 1000 mL

## 2015-06-17 MED ORDER — LIDOCAINE 5 % EX PTCH: 1.0000 | MEDICATED_PATCH | Freq: Every day | CUTANEOUS | Status: DC | PRN

## 2015-06-17 MED ORDER — KETAMINE HCL 10 MG/ML IJ SOLN
INTRAMUSCULAR | Status: AC
  Filled 2015-06-17: qty 1

## 2015-06-17 MED ORDER — BUPIVACAINE-EPINEPHRINE 0.25% -1:200000 IJ SOLN
INTRAMUSCULAR | Status: AC
  Filled 2015-06-17: qty 1

## 2015-06-17 MED ORDER — KETAMINE HCL 10 MG/ML IJ SOLN
INTRAMUSCULAR | Status: DC | PRN
  Administered 2015-06-17: 20 mg via INTRAVENOUS

## 2015-06-17 MED ORDER — PROPOFOL 10 MG/ML IV BOLUS
INTRAVENOUS | Status: AC
  Filled 2015-06-17: qty 20

## 2015-06-17 MED ORDER — SODIUM CHLORIDE 0.9 % IV SOLN
INTRAVENOUS | Status: DC
  Administered 2015-06-17: 100 mL/h via INTRAVENOUS
  Administered 2015-06-18: 1000 mL via INTRAVENOUS

## 2015-06-17 MED ORDER — SODIUM CHLORIDE 0.9 % IJ SOLN
INTRAMUSCULAR | Status: DC | PRN
  Administered 2015-06-17: 30 mL

## 2015-06-17 MED ORDER — VANCOMYCIN HCL IN DEXTROSE 1-5 GM/200ML-% IV SOLN
1000.0000 mg | Freq: Two times a day (BID) | INTRAVENOUS | Status: AC
  Administered 2015-06-17: 1000 mg via INTRAVENOUS
  Filled 2015-06-17: qty 200

## 2015-06-17 MED ORDER — BUPIVACAINE-EPINEPHRINE (PF) 0.25% -1:200000 IJ SOLN
INTRAMUSCULAR | Status: DC | PRN
  Administered 2015-06-17: 30 mL

## 2015-06-17 MED ORDER — MIDAZOLAM HCL 5 MG/5ML IJ SOLN
INTRAMUSCULAR | Status: DC | PRN
  Administered 2015-06-17 (×3): 1 mg via INTRAVENOUS

## 2015-06-17 MED ORDER — METOCLOPRAMIDE HCL 5 MG/ML IJ SOLN
5.0000 mg | Freq: Three times a day (TID) | INTRAMUSCULAR | Status: DC | PRN
  Administered 2015-06-17: 10 mg via INTRAVENOUS
  Filled 2015-06-17: qty 2

## 2015-06-17 MED ORDER — PROPOFOL 10 MG/ML IV BOLUS
INTRAVENOUS | Status: DC | PRN
  Administered 2015-06-17: 100 mg via INTRAVENOUS
  Administered 2015-06-17: 20 mg via INTRAVENOUS

## 2015-06-17 MED ORDER — HYDROMORPHONE HCL 1 MG/ML IJ SOLN: 0.5000 mg | INTRAMUSCULAR | Status: DC | PRN

## 2015-06-17 MED ORDER — ASPIRIN EC 325 MG PO TBEC
325.0000 mg | DELAYED_RELEASE_TABLET | Freq: Two times a day (BID) | ORAL | Status: DC
  Administered 2015-06-18: 325 mg via ORAL
  Filled 2015-06-17 (×3): qty 1

## 2015-06-17 MED ORDER — ONDANSETRON HCL 4 MG PO TABS: 4.0000 mg | ORAL_TABLET | Freq: Four times a day (QID) | ORAL | Status: DC | PRN

## 2015-06-17 MED ORDER — HYDROMORPHONE HCL 2 MG/ML IJ SOLN
INTRAMUSCULAR | Status: AC
  Filled 2015-06-17: qty 1

## 2015-06-17 MED ORDER — TRANEXAMIC ACID 1000 MG/10ML IV SOLN
1000.0000 mg | Freq: Once | INTRAVENOUS | Status: AC
  Administered 2015-06-17: 1000 mg via INTRAVENOUS
  Filled 2015-06-17: qty 10

## 2015-06-17 MED ORDER — PROMETHAZINE HCL 25 MG/ML IJ SOLN
6.2500 mg | Freq: Four times a day (QID) | INTRAMUSCULAR | Status: DC | PRN
  Filled 2015-06-17: qty 1

## 2015-06-17 MED ORDER — LABETALOL HCL 5 MG/ML IV SOLN
INTRAVENOUS | Status: AC
  Filled 2015-06-17: qty 4

## 2015-06-17 MED ORDER — METOCLOPRAMIDE HCL 5 MG PO TABS
5.0000 mg | ORAL_TABLET | Freq: Three times a day (TID) | ORAL | Status: DC | PRN
  Filled 2015-06-17: qty 2

## 2015-06-17 MED ORDER — FERROUS SULFATE 325 (65 FE) MG PO TABS
325.0000 mg | ORAL_TABLET | Freq: Three times a day (TID) | ORAL | Status: DC
  Filled 2015-06-17 (×6): qty 1

## 2015-06-17 MED ORDER — ONDANSETRON HCL 4 MG/2ML IJ SOLN
INTRAMUSCULAR | Status: DC | PRN
Start: 2015-06-17 — End: 2015-06-17
  Administered 2015-06-17: 4 mg via INTRAVENOUS

## 2015-06-17 MED ORDER — NORTRIPTYLINE HCL 25 MG PO CAPS
35.0000 mg | ORAL_CAPSULE | Freq: Every day | ORAL | Status: DC
  Administered 2015-06-17: 35 mg via ORAL
  Filled 2015-06-17 (×2): qty 1

## 2015-06-17 MED ORDER — MIDAZOLAM HCL 2 MG/2ML IJ SOLN
INTRAMUSCULAR | Status: AC
  Filled 2015-06-17: qty 2

## 2015-06-17 MED ORDER — PROPOFOL 500 MG/50ML IV EMUL
INTRAVENOUS | Status: DC | PRN
  Administered 2015-06-17: 25 ug/kg/min via INTRAVENOUS

## 2015-06-17 MED ORDER — FENTANYL CITRATE (PF) 100 MCG/2ML IJ SOLN
INTRAMUSCULAR | Status: DC | PRN
  Administered 2015-06-17: 75 ug via INTRAVENOUS
  Administered 2015-06-17: 25 ug via INTRAVENOUS
  Administered 2015-06-17 (×2): 50 ug via INTRAVENOUS

## 2015-06-17 MED ORDER — METHOCARBAMOL 1000 MG/10ML IJ SOLN
500.0000 mg | Freq: Four times a day (QID) | INTRAVENOUS | Status: DC | PRN
  Administered 2015-06-17: 500 mg via INTRAVENOUS
  Filled 2015-06-17: qty 550
  Filled 2015-06-17: qty 5

## 2015-06-17 MED ORDER — VANCOMYCIN HCL IN DEXTROSE 1-5 GM/200ML-% IV SOLN
1000.0000 mg | INTRAVENOUS | Status: AC
  Administered 2015-06-17: 1000 mg via INTRAVENOUS
  Filled 2015-06-17: qty 200

## 2015-06-17 MED ORDER — METOCLOPRAMIDE HCL 5 MG/ML IJ SOLN
INTRAMUSCULAR | Status: AC
  Filled 2015-06-17: qty 2

## 2015-06-17 MED ORDER — KETOROLAC TROMETHAMINE 30 MG/ML IJ SOLN
INTRAMUSCULAR | Status: DC | PRN
  Administered 2015-06-17: 30 mg

## 2015-06-17 MED ORDER — LABETALOL HCL 5 MG/ML IV SOLN
INTRAVENOUS | Status: DC | PRN
  Administered 2015-06-17: 2.5 mg via INTRAVENOUS

## 2015-06-17 MED ORDER — NORTRIPTYLINE HCL 25 MG PO CAPS
25.0000 mg | ORAL_CAPSULE | Freq: Every day | ORAL | Status: DC
  Filled 2015-06-17: qty 1

## 2015-06-17 MED ORDER — ESOMEPRAZOLE MAGNESIUM 20 MG PO CPDR
20.0000 mg | DELAYED_RELEASE_CAPSULE | Freq: Every day | ORAL | Status: DC
  Administered 2015-06-18: 20 mg via ORAL
  Filled 2015-06-17 (×2): qty 1

## 2015-06-17 MED ORDER — SODIUM CHLORIDE 0.9 % IJ SOLN
INTRAMUSCULAR | Status: AC
  Filled 2015-06-17: qty 50

## 2015-06-17 MED ORDER — MAGNESIUM CITRATE PO SOLN: 1.0000 | Freq: Once | ORAL | Status: DC | PRN

## 2015-06-17 MED ORDER — DIAZEPAM 2 MG PO TABS: 2.0000 mg | ORAL_TABLET | ORAL | Status: DC

## 2015-06-17 MED ORDER — METHOCARBAMOL 500 MG PO TABS
500.0000 mg | ORAL_TABLET | Freq: Four times a day (QID) | ORAL | Status: DC | PRN
  Administered 2015-06-17: 500 mg via ORAL
  Filled 2015-06-17: qty 1

## 2015-06-17 MED ORDER — PHENOL 1.4 % MT LIQD: 1.0000 | OROMUCOSAL | Status: DC | PRN

## 2015-06-17 MED ORDER — HYDROMORPHONE HCL 1 MG/ML IJ SOLN
INTRAMUSCULAR | Status: DC | PRN
  Administered 2015-06-17: 1 mg via INTRAVENOUS

## 2015-06-17 MED ORDER — SUCCINYLCHOLINE CHLORIDE 20 MG/ML IJ SOLN
INTRAMUSCULAR | Status: DC | PRN
  Administered 2015-06-17: 100 mg via INTRAVENOUS

## 2015-06-17 MED ORDER — DIPHENHYDRAMINE HCL 25 MG PO CAPS: 25.0000 mg | ORAL_CAPSULE | Freq: Four times a day (QID) | ORAL | Status: DC | PRN

## 2015-06-17 MED ORDER — ALUM & MAG HYDROXIDE-SIMETH 200-200-20 MG/5ML PO SUSP: 30.0000 mL | ORAL | Status: DC | PRN

## 2015-06-17 MED ORDER — DEXAMETHASONE SODIUM PHOSPHATE 10 MG/ML IJ SOLN
INTRAMUSCULAR | Status: AC
  Filled 2015-06-17: qty 1

## 2015-06-17 MED ORDER — MENTHOL 3 MG MT LOZG: 1.0000 | LOZENGE | OROMUCOSAL | Status: DC | PRN

## 2015-06-17 MED ORDER — LOSARTAN POTASSIUM 50 MG PO TABS
50.0000 mg | ORAL_TABLET | Freq: Every day | ORAL | Status: DC
  Administered 2015-06-17 – 2015-06-18 (×2): 50 mg via ORAL
  Filled 2015-06-17 (×2): qty 1

## 2015-06-17 MED ORDER — PROPOFOL 10 MG/ML IV BOLUS
INTRAVENOUS | Status: AC
  Filled 2015-06-17: qty 40

## 2015-06-17 MED ORDER — LIDOCAINE HCL (CARDIAC) 20 MG/ML IV SOLN
INTRAVENOUS | Status: DC | PRN
  Administered 2015-06-17 (×2): 50 mg via INTRAVENOUS

## 2015-06-17 MED ORDER — LACTATED RINGERS IV SOLN
INTRAVENOUS | Status: DC | PRN
  Administered 2015-06-17 (×2): via INTRAVENOUS

## 2015-06-17 SURGICAL SUPPLY — 46 items
BAG SPEC THK2 15X12 ZIP CLS (MISCELLANEOUS) ×1
BAG ZIPLOCK 12X15 (MISCELLANEOUS) ×2 IMPLANT
BANDAGE ACE 6X5 VEL STRL LF (GAUZE/BANDAGES/DRESSINGS) ×3 IMPLANT
BLADE SAW SGTL 13.0X1.19X90.0M (BLADE) ×3 IMPLANT
BOWL SMART MIX CTS (DISPOSABLE) ×3 IMPLANT
CAPT KNEE TOTAL 3 ATTUNE ×2 IMPLANT
CEMENT HV SMART SET (Cement) ×4 IMPLANT
CLOTH BEACON ORANGE TIMEOUT ST (SAFETY) ×3 IMPLANT
CUFF TOURN SGL QUICK 34 (TOURNIQUET CUFF) ×3
CUFF TRNQT CYL 34X4X40X1 (TOURNIQUET CUFF) ×1 IMPLANT
DECANTER SPIKE VIAL GLASS SM (MISCELLANEOUS) ×3 IMPLANT
DRAPE U-SHAPE 47X51 STRL (DRAPES) ×3 IMPLANT
DRESSING AQUACEL AG SP 3.5X10 (GAUZE/BANDAGES/DRESSINGS) ×1 IMPLANT
DRSG AQUACEL AG ADV 3.5X10 (GAUZE/BANDAGES/DRESSINGS) ×2 IMPLANT
DRSG AQUACEL AG SP 3.5X10 (GAUZE/BANDAGES/DRESSINGS) ×3
DURAPREP 26ML APPLICATOR (WOUND CARE) ×6 IMPLANT
ELECT REM PT RETURN 9FT ADLT (ELECTROSURGICAL) ×3
ELECTRODE REM PT RTRN 9FT ADLT (ELECTROSURGICAL) ×1 IMPLANT
GLOVE BIOGEL PI IND STRL 7.5 (GLOVE) ×1 IMPLANT
GLOVE BIOGEL PI IND STRL 8 (GLOVE) IMPLANT
GLOVE BIOGEL PI IND STRL 8.5 (GLOVE) ×1 IMPLANT
GLOVE BIOGEL PI INDICATOR 7.5 (GLOVE) ×6
GLOVE BIOGEL PI INDICATOR 8 (GLOVE) ×2
GLOVE BIOGEL PI INDICATOR 8.5 (GLOVE) ×4
GLOVE ECLIPSE 8.0 STRL XLNG CF (GLOVE) ×3 IMPLANT
GLOVE ORTHO TXT STRL SZ7.5 (GLOVE) ×6 IMPLANT
GLOVE SURG SS PI 8.0 STRL IVOR (GLOVE) ×2 IMPLANT
GOWN STRL REUS W/TWL 2XL LVL3 (GOWN DISPOSABLE) ×2 IMPLANT
GOWN STRL REUS W/TWL LRG LVL3 (GOWN DISPOSABLE) ×3 IMPLANT
GOWN STRL REUS W/TWL XL LVL3 (GOWN DISPOSABLE) ×5 IMPLANT
HANDPIECE INTERPULSE COAX TIP (DISPOSABLE) ×3
LIQUID BAND (GAUZE/BANDAGES/DRESSINGS) ×3 IMPLANT
MANIFOLD NEPTUNE II (INSTRUMENTS) ×3 IMPLANT
PACK TOTAL KNEE CUSTOM (KITS) ×3 IMPLANT
POSITIONER SURGICAL ARM (MISCELLANEOUS) ×3 IMPLANT
SET HNDPC FAN SPRY TIP SCT (DISPOSABLE) ×1 IMPLANT
SET PAD KNEE POSITIONER (MISCELLANEOUS) ×3 IMPLANT
SUT MNCRL AB 4-0 PS2 18 (SUTURE) ×3 IMPLANT
SUT VIC AB 1 CT1 36 (SUTURE) ×3 IMPLANT
SUT VIC AB 2-0 CT1 27 (SUTURE) ×9
SUT VIC AB 2-0 CT1 TAPERPNT 27 (SUTURE) ×3 IMPLANT
SUT VLOC 180 0 24IN GS25 (SUTURE) ×3 IMPLANT
SYR 50ML LL SCALE MARK (SYRINGE) ×3 IMPLANT
TRAY FOLEY W/METER SILVER 14FR (SET/KITS/TRAYS/PACK) ×3 IMPLANT
WRAP KNEE MAXI GEL POST OP (GAUZE/BANDAGES/DRESSINGS) ×3 IMPLANT
YANKAUER SUCT BULB TIP 10FT TU (MISCELLANEOUS) ×3 IMPLANT

## 2015-06-17 NOTE — Interval H&P Note (Signed)
History and Physical Interval Note:  06/17/2015 7:05 AM  Rhonda Henry  has presented today for surgery, with the diagnosis of left knee osteoarthritis  The various methods of treatment have been discussed with the patient and family. After consideration of risks, benefits and other options for treatment, the patient has consented to  Procedure(s): LEFT TOTAL KNEE ARTHROPLASTY (Left) as a surgical intervention .  The patient's history has been reviewed, patient examined, no change in status, stable for surgery.  I have reviewed the patient's chart and labs.  Questions were answered to the patient's satisfaction.     Mauri Pole

## 2015-06-17 NOTE — Anesthesia Postprocedure Evaluation (Signed)
Anesthesia Post Note  Patient: Rhonda Henry  Procedure(s) Performed: Procedure(s) (LRB): LEFT TOTAL KNEE ARTHROPLASTY (Left)  Patient location during evaluation: PACU Anesthesia Type: General Level of consciousness: awake and alert Pain management: pain level controlled Vital Signs Assessment: post-procedure vital signs reviewed and stable Respiratory status: spontaneous breathing, nonlabored ventilation, respiratory function stable and patient connected to nasal cannula oxygen Cardiovascular status: blood pressure returned to baseline and stable Postop Assessment: no signs of nausea or vomiting Anesthetic complications: no    Last Vitals:  Filed Vitals:   06/17/15 1123 06/17/15 1202  BP: 148/74 150/80  Pulse: 84 78  Temp: 36.4 C   Resp: 12     Last Pain:  Filed Vitals:   06/17/15 1202  PainSc: 2                  Taiki Buckwalter L

## 2015-06-17 NOTE — Anesthesia Procedure Notes (Addendum)
Spinal Patient location during procedure: OR Start time: 06/17/2015 7:27 AM End time: 06/17/2015 7:40 AM Staffing Performed by: anesthesiologist  Preanesthetic Checklist Completed: patient identified, site marked, surgical consent, pre-op evaluation, timeout performed, IV checked, risks and benefits discussed and monitors and equipment checked Spinal Block Patient position: sitting Prep: Betadine Patient monitoring: heart rate, continuous pulse ox and blood pressure Location: L3-4 Injection technique: single-shot Needle Needle type: Quincke  Needle gauge: 22 G Needle length: 9 cm Additional Notes Expiration date of kit checked and confirmed. Patient tolerated procedure well, without complications.  Tried multiple times paramedian and midline without success.  Aborted spinal and proceeded to general anesthesia.    Procedure Name: Intubation Date/Time: 06/17/2015 7:42 AM Performed by: Deliah Boston Pre-anesthesia Checklist: Patient identified, Emergency Drugs available, Suction available and Patient being monitored Patient Re-evaluated:Patient Re-evaluated prior to inductionOxygen Delivery Method: Circle system utilized Preoxygenation: Pre-oxygenation with 100% oxygen Intubation Type: IV induction Ventilation: Mask ventilation without difficulty Laryngoscope Size: Mac and 3 Grade View: Grade I Tube type: Oral Tube size: 7.0 mm Number of attempts: 1 Airway Equipment and Method: Oral airway Placement Confirmation: ETT inserted through vocal cords under direct vision,  positive ETCO2 and breath sounds checked- equal and bilateral Secured at: 20 cm Tube secured with: Tape Dental Injury: Teeth and Oropharynx as per pre-operative assessment

## 2015-06-17 NOTE — Evaluation (Signed)
Physical Therapy Evaluation Patient Details Name: Rhonda Henry MRN: [id number] DOB: Sep 04, 1945 Today's Date: 06/17/2015   History of Present Illness  70 yo female s/p L TKA 06/17/15  Clinical Impression  On eval POD 0, pt required Min assist for mobility-walked ~20 feet. Pain ~3/10 with activity. Will follow and progress activity as tolerated.     Follow Up Recommendations Outpatient PT    Equipment Recommendations  None recommended by PT    Recommendations for Other Services       Precautions / Restrictions Precautions Precautions: Fall Restrictions Weight Bearing Restrictions: No LLE Weight Bearing: Weight bearing as tolerated      Mobility  Bed Mobility Overal bed mobility: Needs Assistance Bed Mobility: Supine to Sit     Supine to sit: Min assist;HOB elevated     General bed mobility comments: Assist for L LE.   Transfers Overall transfer level: Needs assistance Equipment used: Rolling walker (2 wheeled) Transfers: Sit to/from Stand Sit to Stand: Min assist         General transfer comment: Assist to rise, stabilize, control descent. VCs safety, technique, hand/LE placement.   Ambulation/Gait Ambulation/Gait assistance: Min assist Ambulation Distance (Feet): 20 Feet Assistive device: Rolling walker (2 wheeled) Gait Pattern/deviations: Step-to pattern;Antalgic     General Gait Details: Assist to stabilize. LOB x1 requiring external assist to prevent fall.   Stairs            Wheelchair Mobility    Modified Rankin (Stroke Patients Only)       Balance                                             Pertinent Vitals/Pain Pain Assessment: 0-10 Pain Score: 3  Pain Location: L knee Pain Descriptors / Indicators: Aching;Sore Pain Intervention(s): Monitored during session;Repositioned    Home Living Family/patient expects to be discharged to:: Private residence Living Arrangements: Spouse/significant other   Type of  Home: House Home Access: Stairs to enter Entrance Stairs-Rails: Right Entrance Stairs-Number of Steps: 7 Home Layout: Two level;Able to live on main level with bedroom/bathroom Home Equipment: Bedside commode;Walker - 2 wheels;Shower seat      Prior Function Level of Independence: Independent               Hand Dominance        Extremity/Trunk Assessment   Upper Extremity Assessment: Defer to OT evaluation           Lower Extremity Assessment: LLE deficits/detail   LLE Deficits / Details: moves ankle well  Cervical / Trunk Assessment: Normal  Communication   Communication: No difficulties  Cognition Arousal/Alertness: Awake/alert Behavior During Therapy: WFL for tasks assessed/performed Overall Cognitive Status: Within Functional Limits for tasks assessed                      General Comments      Exercises        Assessment/Plan    PT Assessment Patient needs continued PT services  PT Diagnosis Difficulty walking;Acute pain   PT Problem List Decreased strength;Decreased range of motion;Decreased activity tolerance;Decreased balance;Decreased mobility;Decreased knowledge of use of DME;Pain  PT Treatment Interventions DME instruction;Gait training;Stair training;Functional mobility training;Therapeutic activities;Patient/family education;Balance training;Therapeutic exercise   PT Goals (Current goals can be found in the Care Plan section) Acute Rehab PT Goals Patient Stated Goal: to be able to  get up steps to get into home PT Goal Formulation: With patient Time For Goal Achievement: 07/01/15 Potential to Achieve Goals: Good    Frequency 7X/week   Barriers to discharge        Co-evaluation               End of Session   Activity Tolerance: Patient tolerated treatment well Patient left: in chair;with call bell/phone within reach;with family/visitor present           Time: [id number] PT Time Calculation (min) (ACUTE ONLY): 14  min   Charges:   PT Evaluation $PT Eval Low Complexity: 1 Procedure     PT G Codes:        Weston Anna, MPT Pager: [phone]

## 2015-06-17 NOTE — Discharge Instructions (Signed)

## 2015-06-17 NOTE — Op Note (Signed)
NAME:  [place] RECORD NO.:  [id number]                             FACILITY:  [facility]      PHYSICIAN:  Pietro Cassis. Alvan Dame, M.D.  DATE OF BIRTH:  11-Dec-1945      DATE OF PROCEDURE:  06/17/2015                                     OPERATIVE REPORT         PREOPERATIVE DIAGNOSIS:  Left knee osteoarthritis.      POSTOPERATIVE DIAGNOSIS:  Left knee osteoarthritis.      FINDINGS:  The patient was noted to have complete loss of cartilage and   bone-on-bone arthritis with associated osteophytes in the medial and patellofemoral compartments of   the knee with a significant synovitis and associated effusion.      PROCEDURE:  Left total knee replacement.      COMPONENTS USED:  DePuy Attune rotating platform posterior stabilized knee   system, a size 5 N femur, 4 tibia, size 7 mm PS AOX insert, and 32 anatomic patellar   button.      SURGEON:  Pietro Cassis. Alvan Dame, M.D.      ASSISTANT:  Danae Orleans, PA-C.      ANESTHESIA:  General.      SPECIMENS:  None.      COMPLICATION:  None.      DRAINS:  None  EBL: <50cc      TOURNIQUET TIME:   Total Tourniquet Time Documented: Thigh (Left) - 27 minutes Total: Thigh (Left) - 27 minutes  .      The patient was stable to the recovery room.      INDICATION FOR PROCEDURE:  Rhonda Henry is a 70 y.o. female patient of   mine.  The patient had been seen, evaluated, and treated conservatively in the   office with medication, activity modification, and injections.  The patient had   radiographic changes of bone-on-bone arthritis with endplate sclerosis and osteophytes noted.      The patient failed conservative measures including medication, injections, and activity modification, and at this point was ready for more definitive measures.   Based on the radiographic changes and failed conservative measures, the patient   decided to proceed with total knee replacement.  Risks of infection,   DVT, component  failure, need for revision surgery, postop course, and   expectations were all   discussed and reviewed.  Consent was obtained for benefit of pain   relief.      PROCEDURE IN DETAIL:  The patient was brought to the operative theater.   Once adequate anesthesia, preoperative antibiotics, 1 gm of Vancomycin, 1 gm of Tranexamic Acid, and 10 mg of Decadron administered, the patient was positioned supine with the left thigh tourniquet placed.  The  left lower extremity was prepped and draped in sterile fashion.  A time-   out was performed identifying the patient, planned procedure, and   extremity.      The left lower extremity was placed in the [facility] leg holder.  The leg was   exsanguinated, tourniquet elevated to 250 mmHg.  A midline incision was  made followed by median parapatellar arthrotomy.  Following initial   exposure, attention was first directed to the patella.  Precut   measurement was noted to be 21 mm.  I resected down to 14 mm and used a   32 patellar button to restore patellar height as well as cover the cut   surface.      The lug holes were drilled and a metal shim was placed to protect the   patella from retractors and saw blades.      At this point, attention was now directed to the femur.  The femoral   canal was opened with a drill, irrigated to try to prevent fat emboli.  An   intramedullary rod was passed at 3 degrees valgus, 9 mm of bone was   resected off the distal femur.  Following this resection, the tibia was   subluxated anteriorly.  Using the extramedullary guide, 2 mm of bone was resected off   the proximal medial tibia.  We confirmed the gap would be   stable medially and laterally with a size 6 mm insert as well as confirmed   the cut was perpendicular in the coronal plane, checking with an alignment rod.      Once this was done, I sized the femur to be a size 5 in the anterior-   posterior dimension, chose a narrow component based on medial and    lateral dimension.  The size 5 rotation block was then pinned in   position anterior referenced using the C-clamp to set rotation.  The   anterior, posterior, and  chamfer cuts were made without difficulty nor   notching making certain that I was along the anterior cortex to help   with flexion gap stability.      The final box cut was made off the lateral aspect of distal femur.      At this point, the tibia was sized to be a size 4, the size 4 tray was   then pinned in position through the medial third of the tubercle,   drilled, and keel punched.  Trial reduction was now carried with a 5 femur,  4 tibia, a size 6 then 7 mm PS insert, and the 32 anatomic patella botton.  The knee was brought to   extension, full extension with good flexion stability with the patella   tracking through the trochlea without application of pressure.  Given   all these findings the femoral lug holes were drilled the trial components removed.  Final components were   opened and cement was mixed.  The knee was irrigated with normal saline   solution and pulse lavage.  The synovial lining was   then injected with 30 cc of 0.25% Marcaine with epinephrine and 1 cc of Toradol plus 30 cc of NS for a   total of 61 cc.      The knee was irrigated.  Final implants were then cemented onto clean and   dried cut surfaces of bone with the knee brought to extension with a size 7 mm trial insert.      Once the cement had fully cured, the excess cement was removed   throughout the knee.  I confirmed I was satisfied with the range of   motion and stability, and the final size 7 mm PS AOX insert was chosen.  It was   placed into the knee.      The tourniquet had been let down at  27 minutes.  No significant   hemostasis required.  The   extensor mechanism was then reapproximated using #1 Vicryl and #0 V-lock sutures with the knee   in flexion.  The   remaining wound was closed with 2-0 Vicryl and running 4-0 Monocryl.    The knee was cleaned, dried, dressed sterilely using Dermabond and   Aquacel dressing.  The patient was then   brought to recovery room in stable condition, tolerating the procedure   well.   Please note that Physician Assistant, Danae Orleans, PA-C, was present for the entirety of the case, and was utilized for pre-operative positioning, peri-operative retractor management, general facilitation of the procedure.  He was also utilized for primary wound closure at the end of the case.              Pietro Cassis Alvan Dame, M.D.    06/17/2015 8:52 AM

## 2015-06-17 NOTE — Transfer of Care (Signed)
Immediate Anesthesia Transfer of Care Note  Patient: Rhonda Henry  Procedure(s) Performed: Procedure(s) with comments: LEFT TOTAL KNEE ARTHROPLASTY (Left) - Spinal to General  Patient Location: PACU  Anesthesia Type:General  Level of Consciousness: Patient easily awoken, sedated, comfortable, cooperative, following commands, responds to stimulation.   Airway & Oxygen Therapy: Patient spontaneously breathing, ventilating well, oxygen via simple oxygen mask.  Post-op Assessment: Report given to PACU RN, vital signs reviewed and stable, moving all extremities.   Post vital signs: Reviewed and stable.  Complications: No apparent anesthesia complications

## 2015-06-18 DIAGNOSIS — E669 Obesity, unspecified: Secondary | ICD-10-CM | POA: Diagnosis present

## 2015-06-18 LAB — BASIC METABOLIC PANEL
ANION GAP: 4 — AB (ref 5–15)
BUN: 14 mg/dL (ref 6–20)
CALCIUM: 8.5 mg/dL — AB (ref 8.9–10.3)
CO2: 28 mmol/L (ref 22–32)
Chloride: 104 mmol/L (ref 101–111)
Creatinine, Ser: 0.61 mg/dL (ref 0.44–1.00)
GLUCOSE: 113 mg/dL — AB (ref 65–99)
Potassium: 4.5 mmol/L (ref 3.5–5.1)
SODIUM: 136 mmol/L (ref 135–145)

## 2015-06-18 LAB — CBC
HCT: 32.2 % — ABNORMAL LOW (ref 36.0–46.0)
Hemoglobin: 10.4 g/dL — ABNORMAL LOW (ref 12.0–15.0)
MCH: 26.5 pg (ref 26.0–34.0)
MCHC: 32.3 g/dL (ref 30.0–36.0)
MCV: 81.9 fL (ref 78.0–100.0)
PLATELETS: 246 10*3/uL (ref 150–400)
RBC: 3.93 MIL/uL (ref 3.87–5.11)
RDW: 13.4 % (ref 11.5–15.5)
WBC: 10.6 10*3/uL — AB (ref 4.0–10.5)

## 2015-06-18 MED ORDER — FERROUS SULFATE 325 (65 FE) MG PO TABS: 325.0000 mg | ORAL_TABLET | Freq: Three times a day (TID) | ORAL | Status: DC

## 2015-06-18 MED ORDER — CELECOXIB 200 MG PO CAPS: 200.0000 mg | ORAL_CAPSULE | Freq: Two times a day (BID) | ORAL | Status: DC

## 2015-06-18 MED ORDER — PROMETHAZINE HCL 12.5 MG PO TABS: 12.5000 mg | ORAL_TABLET | Freq: Four times a day (QID) | ORAL | Status: DC | PRN

## 2015-06-18 MED ORDER — ASPIRIN 325 MG PO TBEC: 325.0000 mg | DELAYED_RELEASE_TABLET | Freq: Two times a day (BID) | ORAL | Status: AC

## 2015-06-18 MED ORDER — HYDROCODONE-ACETAMINOPHEN 7.5-325 MG PO TABS: 1.0000 | ORAL_TABLET | ORAL | Status: DC | PRN

## 2015-06-18 MED ORDER — POLYETHYLENE GLYCOL 3350 17 G PO PACK: 17.0000 g | PACK | Freq: Two times a day (BID) | ORAL | Status: DC

## 2015-06-18 MED ORDER — DOCUSATE SODIUM 100 MG PO CAPS: 100.0000 mg | ORAL_CAPSULE | Freq: Two times a day (BID) | ORAL | Status: DC

## 2015-06-18 NOTE — Progress Notes (Addendum)
Physical Therapy Treatment Patient Details Name: Rhonda Henry MRN: [id number] DOB: 12/23/1945 Today's Date: 06/18/2015    History of Present Illness 69 yo female s/p L TKA 06/17/15    PT Comments    Progressing well with mobility. 2nd session to practice stair negotiation . Issued HEP and instructed pt to perform 2x/day. All education completed. Ready to d/c from PT standpoint.   Follow Up Recommendations  Outpatient PT     Equipment Recommendations  None recommended by PT    Recommendations for Other Services       Precautions / Restrictions Precautions Precautions: Fall Restrictions Weight Bearing Restrictions: No LLE Weight Bearing: Weight bearing as tolerated    Mobility  Bed Mobility               General bed mobility comments: oob in recliner  Transfers Overall transfer level: Needs assistance Equipment used: Rolling walker (2 wheeled) Transfers: Sit to/from Stand Sit to Stand: Supervision            Ambulation/Gait Ambulation/Gait assistance: Min guard Ambulation Distance (Feet): 50 Feet Assistive device: Rolling walker (2 wheeled) Gait Pattern/deviations: Step-through pattern;Decreased stride length     General Gait Details: close guard for safety. slow gait speed. VCs safety, sequence   Stairs Stairs: Yes Stairs assistance: Min guard Stair Management: One rail Left;Step to pattern;With crutches;Forwards Number of Stairs: 9 General stair comments: close guard for safety. VCs safety, technique, sequence. Husband present to observe/guard as well. Verbally reviewed technique for 1 entry step (walker>strong leg>surgical leg)  Wheelchair Mobility     Modified Rankin (Stroke Patients Only)       Balance                                    Cognition Arousal/Alertness: Awake/alert Behavior During Therapy: WFL for tasks assessed/performed Overall Cognitive Status: Within Functional Limits for tasks assessed                       Exercises      General Comments        Pertinent Vitals/Pain Pain Assessment: 0-10 Pain Score: 7  Pain Location: L knee Pain Descriptors / Indicators: Aching;Sore Pain Intervention(s): Monitored during session;Repositioned    Home Living Family/patient expects to be discharged to:: Private residence Living Arrangements: Spouse/significant other Available Help at Discharge: Family Type of Home: House Home Access: Stairs to enter Entrance Stairs-Rails: Right Home Layout: Two level;Able to live on main level with bedroom/bathroom Home Equipment: [place] - 2 wheels;Bedside commode;Shower seat - built in;Grab bars - toilet      Prior Function Level of Independence: Independent          PT Goals (current goals can now be found in the care plan section) Acute Rehab PT Goals Patient Stated Goal: to be able to get up steps to get into home Progress towards PT goals: Progressing toward goals    Frequency  7X/week    PT Plan Current plan remains appropriate    Co-evaluation             End of Session Equipment Utilized During Treatment: Gait belt Activity Tolerance: Patient tolerated treatment well Patient left: in chair;with call bell/phone within reach;with family/visitor present     Time: [id number] PT Time Calculation (min) (ACUTE ONLY): 23 min  Charges:  $Gait Training: 8-22 mins  G Codes:      Weston Anna, MPT Pager: [phone]

## 2015-06-18 NOTE — Care Management Note (Signed)
Case Management Note  Patient Details  Name: Rhonda Henry MRN: [id number] Date of Birth: 29-Apr-1945  Subjective/Objective:                  LEFT TOTAL KNEE ARTHROPLASTY (Left)  Action/Plan: Discharge planning  Expected Discharge Date:  06/18/15              Expected Discharge Plan:  Home/Self Care  In-House Referral:     Discharge planning Services  CM Consult  Post Acute Care Choice:    Choice offered to:  Patient  DME Arranged:  N/A DME Agency:  NA  HH Arranged:  PT [place] Agency:  Other - See comment  Status of Service:  Completed, signed off  Medicare Important Message Given:    Date Medicare IM Given:    Medicare IM give by:    Date Additional Medicare IM Given:    Additional Medicare Important Message give by:     If discussed at [place] of Stay Meetings, dates discussed:    Additional Comments: Cm met with pt to confirm plan is for Outpt PT; pt confirms plan. Pt states she has all DME needed at home (3n1, RW and Shr Stool).  No other CM needs were communicated. Dellie Catholic, RN 06/18/2015, 10:51 AM

## 2015-06-18 NOTE — Progress Notes (Signed)
Occupational Therapy Evaluation Patient Details Name: Rhonda Henry MRN: [id number] DOB: 1945/01/19 Today's Date: 06/18/2015    History of Present Illness 70 yo female s/p L TKA 06/17/15   Clinical Impression   All OT education completed and pt questions answered. No further OT needs; will sign off.    Follow Up Recommendations  No OT follow up;Supervision/Assistance - 24 hour    Equipment Recommendations  None recommended by OT    Recommendations for Other Services       Precautions / Restrictions Precautions Precautions: Fall Restrictions Weight Bearing Restrictions: No LLE Weight Bearing: Weight bearing as tolerated      Mobility Bed Mobility               General bed mobility comments: oob in recliner  Transfers Overall transfer level: Needs assistance Equipment used: Rolling walker (2 wheeled) Transfers: Sit to/from Stand Sit to Stand: Supervision             Balance                                            ADL Overall ADL's : Needs assistance/impaired     Grooming: Wash/dry hands;Supervision/safety;Standing           Upper Body Dressing : Set up;Sitting   Lower Body Dressing: Minimal assistance;Sit to/from stand   Toilet Transfer: Supervision/safety;Ambulation;BSC;RW   Toileting- Clothing Manipulation and Hygiene: Supervision/safety;Sit to/from stand   Tub/ Shower Transfer: Walk-in shower;Min guard;Ambulation;Shower seat;Rolling walker   Functional mobility during ADLs: Supervision/safety;Rolling walker General ADL Comments: Patient had numerous questions for OT. Answered all.     Vision     Perception     Praxis      Pertinent Vitals/Pain Pain Assessment: 0-10 Pain Score: 7  Pain Location: L knee Pain Descriptors / Indicators: Aching;Sore Pain Intervention(s): Monitored during session;Repositioned     Hand Dominance     Extremity/Trunk Assessment Upper Extremity Assessment Upper Extremity  Assessment: Overall WFL for tasks assessed   Lower Extremity Assessment Lower Extremity Assessment: Defer to PT evaluation   Cervical / Trunk Assessment Cervical / Trunk Assessment: Normal   Communication Communication Communication: No difficulties   Cognition Arousal/Alertness: Awake/alert Behavior During Therapy: WFL for tasks assessed/performed Overall Cognitive Status: Within Functional Limits for tasks assessed                     General Comments       Exercises       Shoulder Instructions      Home Living Family/patient expects to be discharged to:: Private residence Living Arrangements: Spouse/significant other Available Help at Discharge: Family Type of Home: House Home Access: Stairs to enter Technical brewer of Steps: 7 Entrance Stairs-Rails: Right Home Layout: Two level;Able to live on main level with bedroom/bathroom     Bathroom Shower/Tub: Occupational psychologist: Standard Bathroom Accessibility: Yes How Accessible: Accessible via walker Home Equipment: [place] - 2 wheels;Bedside commode;Shower seat - built in;Grab bars - toilet          Prior Functioning/Environment Level of Independence: Independent             OT Diagnosis: Acute pain   OT Problem List: Decreased strength;Decreased range of motion;Decreased activity tolerance;Decreased knowledge of use of DME or AE;Pain   OT Treatment/Interventions:      OT Goals(Current goals can be  found in the care plan section) Acute Rehab OT Goals Patient Stated Goal: to be able to get up steps to get into home OT Goal Formulation: All assessment and education complete, DC therapy  OT Frequency:     Barriers to D/C:            Co-evaluation              End of Session Equipment Utilized During Treatment: Rolling walker Nurse Communication: Mobility status  Activity Tolerance: Patient tolerated treatment well Patient left: in chair;with call bell/phone within  reach;with family/visitor present   Time: 1229-1252 OT Time Calculation (min): 23 min Charges:  OT General Charges $OT Visit: 1 Procedure OT Evaluation $OT Eval Low Complexity: 1 Procedure OT Treatments $Self Care/Home Management : 8-22 mins G-Codes:    Amatullah Christy A Jul 03, 2015, 1:18 PM

## 2015-06-18 NOTE — Progress Notes (Signed)
Physical Therapy Treatment Patient Details Name: MARLETH MCKENDALL MRN: [id number] DOB: 1945-09-08 Today's Date: 06/18/2015    History of Present Illness 70 yo female s/p L TKA 06/17/15    PT Comments    Progressing well with mobility. Will have a 2nd session to practice stair negotiation prior to d/c home today  Follow Up Recommendations  Outpatient PT     Equipment Recommendations  None recommended by PT    Recommendations for Other Services       Precautions / Restrictions Precautions Precautions: Fall Restrictions Weight Bearing Restrictions: No LLE Weight Bearing: Weight bearing as tolerated    Mobility  Bed Mobility               General bed mobility comments: oob in recliner  Transfers Overall transfer level: Needs assistance Equipment used: Rolling walker (2 wheeled) Transfers: Sit to/from Stand Sit to Stand: Min guard         General transfer comment: close guard for safety. VCs safety, hand/LE placement  Ambulation/Gait Ambulation/Gait assistance: Min guard Ambulation Distance (Feet): 55 Feet Assistive device: Rolling walker (2 wheeled) Gait Pattern/deviations: Step-to pattern;Trunk flexed;Antalgic     General Gait Details: close guard for safety. slow gait speed. VCs safety, technique, sequence.    Stairs            Wheelchair Mobility    Modified Rankin (Stroke Patients Only)       Balance                                    Cognition Arousal/Alertness: Awake/alert Behavior During Therapy: WFL for tasks assessed/performed Overall Cognitive Status: Within Functional Limits for tasks assessed                      Exercises Total Joint Exercises Ankle Circles/Pumps: AROM;Both;10 reps;Supine Quad Sets: AROM;Both;10 reps;Supine Hip ABduction/ADduction: AROM;Left;10 reps;Supine Straight Leg Raises: AROM;Left;10 reps;Supine Knee Flexion: AAROM;Left;10 reps;Seated Goniometric ROM: ~10-65 degrees     General Comments        Pertinent Vitals/Pain Pain Assessment: 0-10 Pain Score: 5  Pain Location: L knee Pain Descriptors / Indicators: Aching;Sore Pain Intervention(s): Monitored during session;Repositioned;Ice applied    Home Living                      Prior Function            PT Goals (current goals can now be found in the care plan section) Progress towards PT goals: Progressing toward goals    Frequency  7X/week    PT Plan Current plan remains appropriate    Co-evaluation             End of Session Equipment Utilized During Treatment: Gait belt Activity Tolerance: Patient tolerated treatment well Patient left: in chair;with call bell/phone within reach;with family/visitor present     Time: [id number] PT Time Calculation (min) (ACUTE ONLY): 28 min  Charges:  $Gait Training: 8-22 mins $Therapeutic Exercise: 8-22 mins                    G Codes:      Weston Anna, MPT Pager: [phone]

## 2015-06-18 NOTE — Progress Notes (Signed)
     Subjective: 1 Day Post-Op Procedure(s) (LRB): LEFT TOTAL KNEE ARTHROPLASTY (Left)    Seen by Dr. Alvan Dame. Patient reports pain as mild, pain controlled. No events throughout the night.  Worked well with PT yesterday.  Ready to be discharged home if she continues to do well with PT.   Objective:   VITALS:   Filed Vitals:   06/18/15 0124 06/18/15 0612  BP: 106/64 126/72  Pulse: 100 87  Temp: 98 F (36.7 C) 98.2 F (36.8 C)  Resp: 16 16    Dorsiflexion/Plantar flexion intact Incision: dressing C/D/I No cellulitis present Compartment soft  LABS  Recent Labs  06/18/15 0420  HGB 10.4*  HCT 32.2*  WBC 10.6*  PLT 246     Recent Labs  06/18/15 0420  NA 136  K 4.5  BUN 14  CREATININE 0.61  GLUCOSE 113*     Assessment/Plan: 1 Day Post-Op Procedure(s) (LRB): LEFT TOTAL KNEE ARTHROPLASTY (Left) Foley cath d/c'ed Advance diet Up with therapy D/C IV fluids Discharge home with home health  Follow up in 2 weeks at [facility]. Follow up with OLIN,Crysta Gulick D in 2 weeks.  Contact information:  [facility] [address], [place] [id number]    Obese (BMI 30-39.9) Estimated body mass index is 34.78 kg/(m^2) as calculated from the following:   Height as of this encounter: 5\' 1"  (1.549 m).   Weight as of this encounter: 83.462 kg (184 lb). Patient also counseled that weight may inhibit the healing process Patient counseled that losing weight will help with future health issues         West Pugh. Juletta Berhe   PAC  06/18/2015, 8:31 AM

## 2015-06-19 ENCOUNTER — Telehealth: Payer: Self-pay | Admitting: *Deleted

## 2015-06-19 NOTE — Telephone Encounter (Signed)
Pt was on TCM list admitted for osteoarthritis in (L) Knee . Had a Total (L) knee arthroplasty . Pt will be f/u w/Dr. Alvan Dame in 2 weeks.Marland KitchenJohny Chess

## 2015-06-20 DIAGNOSIS — M1712 Unilateral primary osteoarthritis, left knee: Secondary | ICD-10-CM | POA: Diagnosis not present

## 2015-06-23 DIAGNOSIS — M1712 Unilateral primary osteoarthritis, left knee: Secondary | ICD-10-CM | POA: Diagnosis not present

## 2015-06-23 NOTE — Discharge Summary (Signed)
Physician Discharge Summary  Patient ID: Rhonda Henry MRN: [id number] DOB/AGE: 70-Mar-1947 70 y.o.  Admit date: 06/17/2015 Discharge date: 06/18/2015   Procedures:  Procedure(s) (LRB): LEFT TOTAL KNEE ARTHROPLASTY (Left)  Attending Physician:  Dr. Paralee Cancel   Admission Diagnoses:   Left knee primary OA / pain  Discharge Diagnoses:  Principal Problem:   S/P left TKA Active Problems:   S/P knee replacement   Obese  Past Medical History  Diagnosis Date  . Arthritis   . Complication of anesthesia     "I shake like crazy"  . Recurrent UTI   . Microscopic hematuria      x 30 yrs   . Anxiety   . Difficulty sleeping     no issues now  . Abdominal pain, acute, right upper quadrant     had gall bladder removed and now no issues   . Bloating   . Constipation     not now  . Nausea   . Depression     no issues currently   . Neuromuscular disorder ([place])   . Nerve damage     face -1 yr ago and now almost resolved   . Hypertension   . Heart murmur   . GERD (gastroesophageal reflux disease)     HPI:    Rhonda Henry, 70 y.o. female, has a history of pain and functional disability in the left knee due to arthritis and has failed non-surgical conservative treatments for greater than 12 weeks to include NSAID's and/or analgesics, corticosteriod injections and activity modification. Onset of symptoms was abrupt, starting 6 month ago with rapidlly worsening course since that time. The patient noted no past surgery on the left knee(s). Patient currently rates pain in the left knee(s) at 9 out of 10 with activity. Patient has night pain, worsening of pain with activity and weight bearing, pain that interferes with activities of daily living, pain with passive range of motion, crepitus and joint swelling. Patient has evidence of periarticular osteophytes and joint space narrowing by imaging studies. There is no active infection. Risks, benefits and expectations were discussed  with the patient. Risks including but not limited to the risk of anesthesia, blood clots, nerve damage, blood vessel damage, failure of the prosthesis, infection and up to and including death. Patient understand the risks, benefits and expectations and wishes to proceed with surgery.   PCP: Hoyt Koch, MD   Discharged Condition: good  Hospital Course:  Patient underwent the above stated procedure on 06/17/2015. Patient tolerated the procedure well and brought to the recovery room in good condition and subsequently to the floor.  POD #1 BP: 126/72 ; Pulse: 87 ; Temp: 98.2 F (36.8 C) ; Resp: 16 Patient reports pain as mild, pain controlled. No events throughout the night. Worked well with PT yesterday. Ready to be discharged home. Dorsiflexion/plantar flexion intact, incision: dressing C/D/I, no cellulitis present and compartment soft.   LABS  Basename    HGB     10.4  HCT     32.2    Discharge Exam: General appearance: alert, cooperative and no distress Extremities: Homans sign is negative, no sign of DVT, no edema, redness or tenderness in the calves or thighs and no ulcers, gangrene or trophic changes  Disposition: Home with follow up in 2 weeks   Follow-up Information    Follow up with Mauri Pole, MD. Schedule an appointment as soon as possible for a visit in 2 weeks.   Specialty:  Orthopedic Surgery   Contact information:   [address] [place] [address] [id number]       Discharge Instructions    Call MD / Call 911    Complete by:  As directed   If you experience chest pain or shortness of breath, CALL 911 and be transported to the hospital emergency room.  If you develope a fever above 101 F, pus (white drainage) or increased drainage or redness at the wound, or calf pain, call your surgeon's office.     Change dressing    Complete by:  As directed   Maintain surgical dressing until follow up in the clinic. If the edges start to  pull up, may reinforce with tape. If the dressing is no longer working, may remove and cover with gauze and tape, but must keep the area dry and clean.  Call with any questions or concerns.     Constipation Prevention    Complete by:  As directed   Drink plenty of fluids.  Prune juice may be helpful.  You may use a stool softener, such as Colace (over the counter) 100 mg twice a day.  Use MiraLax (over the counter) for constipation as needed.     Diet - low sodium heart healthy    Complete by:  As directed      Discharge instructions    Complete by:  As directed   Maintain surgical dressing until follow up in the clinic. If the edges start to pull up, may reinforce with tape. If the dressing is no longer working, may remove and cover with gauze and tape, but must keep the area dry and clean.  Follow up in 2 weeks at [facility]. Call with any questions or concerns.     Increase activity slowly as tolerated    Complete by:  As directed   Weight bearing as tolerated with assist device (walker, cane, etc) as directed, use it as long as suggested by your surgeon or therapist, typically at least 4-6 weeks.     TED hose    Complete by:  As directed   Use stockings (TED hose) for 2 weeks on both leg(s).  You may remove them at night for sleeping.             Medication List    STOP taking these medications        acetaminophen 500 MG tablet  Commonly known as:  TYLENOL     diclofenac sodium 1 % Gel  Commonly known as:  VOLTAREN     ibuprofen 200 MG tablet  Commonly known as:  ADVIL,MOTRIN     MIRALAX powder  Generic drug:  polyethylene glycol powder  Replaced by:  polyethylene glycol packet      TAKE these medications        Alpha-Lipoic Acid 600 MG Caps  Take 600 mg by mouth 3 (three) times daily.     aspirin 325 MG EC tablet  Take 1 tablet (325 mg total) by mouth 2 (two) times daily.     BENADRYL 25 MG tablet  Generic drug:  diphenhydrAMINE  Take 25 mg by mouth  every 6 (six) hours as needed for itching or allergies. Reported on 06/06/2015     celecoxib 200 MG capsule  Commonly known as:  CELEBREX  Take 1 capsule (200 mg total) by mouth every 12 (twelve) hours.     clindamycin 150 MG capsule  Commonly known as:  CLEOCIN  Take  150 mg by mouth as needed (Take after sex.).     diazepam 5 MG tablet  Commonly known as:  VALIUM  TAKE 1 TABLET BY MOUTH EVERY 12 HOURS AS NEEDED FOR ANXIETY     docusate sodium 100 MG capsule  Commonly known as:  COLACE  Take 1 capsule (100 mg total) by mouth 2 (two) times daily.     doxycycline 100 MG tablet  Commonly known as:  VIBRA-TABS  Take 1 tablet (100 mg total) by mouth 2 (two) times daily.     EPINEPHrine 0.3 mg/0.3 mL Soaj injection  Commonly known as:  EPIPEN 2-PAK  Inject 0.3 mLs (0.3 mg total) into the muscle once.     esomeprazole 20 MG capsule  Commonly known as:  NEXIUM  Take 20 mg by mouth daily.     ferrous sulfate 325 (65 FE) MG tablet  Take 1 tablet (325 mg total) by mouth 3 (three) times daily after meals.     fexofenadine 180 MG tablet  Commonly known as:  ALLEGRA  Take 180 mg by mouth daily.     HYDROcodone-acetaminophen 7.5-325 MG tablet  Commonly known as:  NORCO  Take 1-2 tablets by mouth every 4 (four) hours as needed for moderate pain.     Lidocaine 0.5 % Gel  Apply 1 application topically daily as needed (Applies to arms for burning.).     lidocaine 5 %  Commonly known as:  LIDODERM  Place 1 patch onto the skin daily as needed (For pain.). Reported on 06/06/2015     losartan 50 MG tablet  Commonly known as:  COZAAR  TAKE 1 TABLET BY MOUTH DAILY     nortriptyline 10 MG capsule  Commonly known as:  PAMELOR  Take 10 mg by mouth at bedtime.     nortriptyline 25 MG capsule  Commonly known as:  PAMELOR  Take 25 mg by mouth at bedtime.     OVER THE COUNTER MEDICATION  Take 1 capsule by mouth daily. Biotin 2565mcg     OVER THE COUNTER MEDICATION  Take 538 mg by mouth  daily. Turmeric 538mg      OVER THE COUNTER MEDICATION  Cannabinoid - 2 drops daily  Patient stopped approximately 05/19/15 for surgery     OVER THE COUNTER MEDICATION  Avocado soy 300 mg two times daily  Patient stopped for surgery on approximately 05/19/2015     polyethylene glycol packet  Commonly known as:  MIRALAX / GLYCOLAX  Take 17 g by mouth 2 (two) times daily.     promethazine 12.5 MG tablet  Commonly known as:  PHENERGAN  Take 1 tablet (12.5 mg total) by mouth every 6 (six) hours as needed for nausea.     Vitamin D-3 1000 units Caps  Take 1,000 Units by mouth daily.         Signed: [place] Pugh. Matthan Sledge   PA-C  06/23/2015, 2:40 PM

## 2015-06-25 DIAGNOSIS — M1712 Unilateral primary osteoarthritis, left knee: Secondary | ICD-10-CM | POA: Diagnosis not present

## 2015-06-27 DIAGNOSIS — M1712 Unilateral primary osteoarthritis, left knee: Secondary | ICD-10-CM | POA: Diagnosis not present

## 2015-06-30 ENCOUNTER — Telehealth: Payer: Self-pay | Admitting: Internal Medicine

## 2015-06-30 DIAGNOSIS — M1712 Unilateral primary osteoarthritis, left knee: Secondary | ICD-10-CM | POA: Diagnosis not present

## 2015-06-30 NOTE — Telephone Encounter (Signed)
Patient is out of hydrocodone.  Spouse has called in.  States that sports med doctor wrote a script but that [facility] states they would have to send a PA to Dr. Charlynne Cousins office.  Spouse does not want a call back to himself or wife.  He is requesting a call go to [facility] as to if this can be done or not.

## 2015-06-30 NOTE — Telephone Encounter (Signed)
Tried to call pharmacy for 10 minutes and could not get through the recording to speak with anyone.  The touch tone was not working.  I did speak with Odis Hollingshead and found out that PA would need to go to sports med phy.  Did call patient back and notified.

## 2015-07-02 DIAGNOSIS — Z471 Aftercare following joint replacement surgery: Secondary | ICD-10-CM | POA: Diagnosis not present

## 2015-07-02 DIAGNOSIS — M1712 Unilateral primary osteoarthritis, left knee: Secondary | ICD-10-CM | POA: Diagnosis not present

## 2015-07-02 DIAGNOSIS — Z96652 Presence of left artificial knee joint: Secondary | ICD-10-CM | POA: Diagnosis not present

## 2015-07-04 DIAGNOSIS — M1712 Unilateral primary osteoarthritis, left knee: Secondary | ICD-10-CM | POA: Diagnosis not present

## 2015-07-07 ENCOUNTER — Other Ambulatory Visit: Payer: Self-pay | Admitting: Internal Medicine

## 2015-07-09 DIAGNOSIS — M1712 Unilateral primary osteoarthritis, left knee: Secondary | ICD-10-CM | POA: Diagnosis not present

## 2015-07-11 DIAGNOSIS — M1712 Unilateral primary osteoarthritis, left knee: Secondary | ICD-10-CM | POA: Diagnosis not present

## 2015-07-15 DIAGNOSIS — M1712 Unilateral primary osteoarthritis, left knee: Secondary | ICD-10-CM | POA: Diagnosis not present

## 2015-07-17 DIAGNOSIS — M1712 Unilateral primary osteoarthritis, left knee: Secondary | ICD-10-CM | POA: Diagnosis not present

## 2015-07-18 ENCOUNTER — Ambulatory Visit
Admission: RE | Admit: 2015-07-18 | Discharge: 2015-07-18 | Disposition: A | Discharge status: Home / Self Care | Payer: Medicare Other | Source: Ambulatory Visit | Attending: Internal Medicine | Admitting: Internal Medicine

## 2015-07-18 ENCOUNTER — Ambulatory Visit (INDEPENDENT_AMBULATORY_CARE_PROVIDER_SITE_OTHER): Payer: Medicare Other | Admitting: Internal Medicine

## 2015-07-18 ENCOUNTER — Other Ambulatory Visit: Payer: Self-pay | Admitting: Internal Medicine

## 2015-07-18 ENCOUNTER — Encounter: Payer: Self-pay | Admitting: Internal Medicine

## 2015-07-18 ENCOUNTER — Other Ambulatory Visit (INDEPENDENT_AMBULATORY_CARE_PROVIDER_SITE_OTHER): Payer: Medicare Other

## 2015-07-18 VITALS — BP 120/82 | HR 87 | Temp 98.1°F | Wt 178.0 lb

## 2015-07-18 DIAGNOSIS — R0602 Shortness of breath: Secondary | ICD-10-CM | POA: Insufficient documentation

## 2015-07-18 DIAGNOSIS — R7989 Other specified abnormal findings of blood chemistry: Secondary | ICD-10-CM

## 2015-07-18 DIAGNOSIS — R42 Dizziness and giddiness: Secondary | ICD-10-CM

## 2015-07-18 LAB — COMPREHENSIVE METABOLIC PANEL
ALK PHOS: 88 U/L (ref 39–117)
ALT: 13 U/L (ref 0–35)
AST: 16 U/L (ref 0–37)
Albumin: 4.1 g/dL (ref 3.5–5.2)
BILIRUBIN TOTAL: 0.5 mg/dL (ref 0.2–1.2)
BUN: 19 mg/dL (ref 6–23)
CALCIUM: 9.8 mg/dL (ref 8.4–10.5)
CO2: 27 mEq/L (ref 19–32)
Chloride: 105 mEq/L (ref 96–112)
Creatinine, Ser: 0.69 mg/dL (ref 0.40–1.20)
GFR: 89.28 mL/min (ref >60.00)
Glucose, Bld: 91 mg/dL (ref 70–99)
Potassium: 4.2 mEq/L (ref 3.5–5.1)
Sodium: 139 mEq/L (ref 135–145)
TOTAL PROTEIN: 6.7 g/dL (ref 6.0–8.3)

## 2015-07-18 LAB — CBC
HCT: 38.3 % (ref 36.0–46.0)
Hemoglobin: 12.6 g/dL (ref 12.0–15.0)
MCHC: 33 g/dL (ref 30.0–36.0)
MCV: 82.8 fl (ref 78.0–100.0)
PLATELETS: 304 10*3/uL (ref 150.0–400.0)
RBC: 4.63 Mil/uL (ref 3.87–5.11)
RDW: 14.8 % (ref 11.5–15.5)
WBC: 6.9 10*3/uL (ref 4.0–10.5)

## 2015-07-18 LAB — BRAIN NATRIURETIC PEPTIDE: Pro B Natriuretic peptide (BNP): 13 pg/mL (ref 0.0–100.0)

## 2015-07-18 LAB — D-DIMER, QUANTITATIVE (NOT AT ARMC): D DIMER QUANT: 3.48 ug{FEU}/mL — AB (ref <0.50)

## 2015-07-18 MED ORDER — IOPAMIDOL (ISOVUE-370) INJECTION 76%
100.0000 mL | Freq: Once | INTRAVENOUS | Status: AC | PRN
  Administered 2015-07-18: 100 mL via INTRAVENOUS

## 2015-07-18 NOTE — Assessment & Plan Note (Signed)
Ears normal on exam, orthostatics negative for hypotension on exam. Checking labs CBC, CMP, BNP, d-dimer.

## 2015-07-18 NOTE — Progress Notes (Signed)
Pre visit review using our clinic review tool, if applicable. No additional management support is needed unless otherwise documented below in the visit note. 

## 2015-07-18 NOTE — Progress Notes (Signed)
   Subjective:    Patient ID: Rhonda Henry, female    DOB: 03/27/1945, 70 y.o.   MRN: [id number]  HPI The patient is a 69 YO female coming in for follow up after knee surgery. She is having some new dizziness since the surgery. Having some lightheaded feeling with standing or exertion. Some SOB on exertion as well. Denies swelling in her legs or calves. Some swelling around the left knee where she had the surgery. Denies losing blood. No fevers or chills. No cough or sinus pressure or drainage. No ear pain or hearing changes. She is taking hydrocodone but does not relate the symptoms to taking it as she had not taken it all day yesterday and still was having symptoms. Did not take any anticoagulation after the surgery.   Review of Systems  Constitutional: Negative for fever, activity change, appetite change, fatigue and unexpected weight change.  HENT: Negative.   Eyes: Negative.   Respiratory: Positive for shortness of breath. Negative for cough, chest tightness and wheezing.   Cardiovascular: Positive for leg swelling. Negative for chest pain and palpitations.  Gastrointestinal: Negative for abdominal pain, diarrhea, constipation and abdominal distention.  Musculoskeletal: Positive for myalgias and arthralgias.  Skin: Negative.   Neurological: Positive for dizziness, light-headedness and numbness. Negative for weakness and headaches.  Psychiatric/Behavioral: Positive for sleep disturbance and dysphoric mood. Negative for suicidal ideas and self-injury.      Objective:   Physical Exam  Constitutional: She is oriented to person, place, and time. She appears well-developed and well-nourished.  HENT:  Head: Normocephalic and atraumatic.  Eyes: EOM are normal.  Neck: Normal range of motion.  Cardiovascular: Normal rate and regular rhythm.   Pulmonary/Chest: Effort normal and breath sounds normal. She has no wheezes.  Abdominal: Bowel sounds are normal. She exhibits no distension. There  is no tenderness. There is no rebound.  Musculoskeletal: She exhibits no edema.  Knee with scar healing well from recent surgery. No calf swelling or calf tenderness left.   Neurological: She is alert and oriented to person, place, and time. Coordination normal.  Skin: Skin is warm and dry.   Filed Vitals:   07/18/15 1105  BP: 120/82  Pulse: 87  Temp: 98.1 F (36.7 C)  TempSrc: Oral  Weight: 178 lb (80.74 kg)  SpO2: 98%  Standing after 4 minutes without any changes of orthostatic hypotension.     Assessment & Plan:

## 2015-07-18 NOTE — Patient Instructions (Signed)
We are checking blood work on you today to check for the cause of the breathing problems and the dizziness.   The blood pressure was the same with standing so it is not caused by low blood pressure. We will recommend that you try to drink some more fluids to make sure it is not some dehydration.   We will call you back with the lab results.

## 2015-07-18 NOTE — Assessment & Plan Note (Signed)
S/P recent left knee TKA and no calf tenderness or swelling. Checking BNP, D-dimer (low risk PE), CBC, CMP. Orthostatic vital signs without suggestion of hypotension in the exam today and BP normal during exam today. Treat as appropriate. No anticoagulation after surgery.

## 2015-07-21 DIAGNOSIS — M79641 Pain in right hand: Secondary | ICD-10-CM | POA: Diagnosis not present

## 2015-07-21 DIAGNOSIS — M79642 Pain in left hand: Secondary | ICD-10-CM | POA: Diagnosis not present

## 2015-07-21 DIAGNOSIS — Z79899 Other long term (current) drug therapy: Secondary | ICD-10-CM | POA: Diagnosis not present

## 2015-07-21 DIAGNOSIS — G894 Chronic pain syndrome: Secondary | ICD-10-CM | POA: Diagnosis not present

## 2015-07-21 DIAGNOSIS — Z79891 Long term (current) use of opiate analgesic: Secondary | ICD-10-CM | POA: Diagnosis not present

## 2015-07-22 DIAGNOSIS — M1712 Unilateral primary osteoarthritis, left knee: Secondary | ICD-10-CM | POA: Diagnosis not present

## 2015-07-29 DIAGNOSIS — M1712 Unilateral primary osteoarthritis, left knee: Secondary | ICD-10-CM | POA: Diagnosis not present

## 2015-07-30 DIAGNOSIS — Z96652 Presence of left artificial knee joint: Secondary | ICD-10-CM | POA: Diagnosis not present

## 2015-07-30 DIAGNOSIS — Z471 Aftercare following joint replacement surgery: Secondary | ICD-10-CM | POA: Diagnosis not present

## 2015-07-31 DIAGNOSIS — M1712 Unilateral primary osteoarthritis, left knee: Secondary | ICD-10-CM | POA: Diagnosis not present

## 2015-08-04 DIAGNOSIS — M1712 Unilateral primary osteoarthritis, left knee: Secondary | ICD-10-CM | POA: Diagnosis not present

## 2015-08-07 DIAGNOSIS — M1712 Unilateral primary osteoarthritis, left knee: Secondary | ICD-10-CM | POA: Diagnosis not present

## 2015-08-12 DIAGNOSIS — M1712 Unilateral primary osteoarthritis, left knee: Secondary | ICD-10-CM | POA: Diagnosis not present

## 2015-08-19 DIAGNOSIS — M1712 Unilateral primary osteoarthritis, left knee: Secondary | ICD-10-CM | POA: Diagnosis not present

## 2015-08-21 DIAGNOSIS — M1712 Unilateral primary osteoarthritis, left knee: Secondary | ICD-10-CM | POA: Diagnosis not present

## 2015-08-22 DIAGNOSIS — M1712 Unilateral primary osteoarthritis, left knee: Secondary | ICD-10-CM | POA: Diagnosis not present

## 2015-08-25 DIAGNOSIS — M1712 Unilateral primary osteoarthritis, left knee: Secondary | ICD-10-CM | POA: Diagnosis not present

## 2015-08-28 DIAGNOSIS — M1712 Unilateral primary osteoarthritis, left knee: Secondary | ICD-10-CM | POA: Diagnosis not present

## 2015-09-09 ENCOUNTER — Other Ambulatory Visit: Payer: Self-pay | Admitting: Internal Medicine

## 2015-09-09 DIAGNOSIS — R42 Dizziness and giddiness: Secondary | ICD-10-CM

## 2015-09-10 DIAGNOSIS — Z471 Aftercare following joint replacement surgery: Secondary | ICD-10-CM | POA: Diagnosis not present

## 2015-09-10 DIAGNOSIS — Z96652 Presence of left artificial knee joint: Secondary | ICD-10-CM | POA: Diagnosis not present

## 2015-09-17 ENCOUNTER — Encounter (HOSPITAL_COMMUNITY): Payer: Medicare Other

## 2015-09-24 DIAGNOSIS — Z471 Aftercare following joint replacement surgery: Secondary | ICD-10-CM | POA: Diagnosis not present

## 2015-09-24 DIAGNOSIS — Z96652 Presence of left artificial knee joint: Secondary | ICD-10-CM | POA: Diagnosis not present

## 2015-09-25 ENCOUNTER — Other Ambulatory Visit: Payer: Self-pay | Admitting: Internal Medicine

## 2015-09-25 DIAGNOSIS — Z1231 Encounter for screening mammogram for malignant neoplasm of breast: Secondary | ICD-10-CM

## 2015-10-09 ENCOUNTER — Telehealth: Payer: Self-pay | Admitting: Internal Medicine

## 2015-10-09 ENCOUNTER — Encounter: Payer: Self-pay | Admitting: Internal Medicine

## 2015-10-09 ENCOUNTER — Ambulatory Visit (INDEPENDENT_AMBULATORY_CARE_PROVIDER_SITE_OTHER): Payer: Medicare Other | Admitting: Internal Medicine

## 2015-10-09 ENCOUNTER — Other Ambulatory Visit: Payer: Self-pay | Admitting: Internal Medicine

## 2015-10-09 VITALS — BP 152/92 | HR 88 | Temp 98.2°F | Resp 14 | Wt 177.0 lb

## 2015-10-09 DIAGNOSIS — R300 Dysuria: Secondary | ICD-10-CM

## 2015-10-09 DIAGNOSIS — N3000 Acute cystitis without hematuria: Secondary | ICD-10-CM | POA: Diagnosis not present

## 2015-10-09 LAB — POCT URINALYSIS DIPSTICK
BILIRUBIN UA: NEGATIVE
GLUCOSE UA: NEGATIVE
KETONES UA: NEGATIVE
Nitrite, UA: NEGATIVE
Spec Grav, UA: >=1.03
Urobilinogen, UA: NEGATIVE
pH, UA: 6

## 2015-10-09 MED ORDER — SULFAMETHOXAZOLE-TRIMETHOPRIM 800-160 MG PO TABS: 1.0000 | ORAL_TABLET | Freq: Two times a day (BID) | ORAL | 0 refills | Status: DC

## 2015-10-09 NOTE — Progress Notes (Signed)
   Subjective:    Patient ID: Rhonda Henry, female    DOB: 11/17/1945, 70 y.o.   MRN: [id number]  HPI The patient is a 70 YO female coming in for dysuria for 2 days. She is also having frequency. Has not tried anything for the symptoms. Some pressure when she has to urinate. No fevers or chills. Feels like a bladder infection.   Review of Systems  Constitutional: Negative.   Respiratory: Negative.   Cardiovascular: Negative.   Gastrointestinal: Positive for abdominal pain. Negative for abdominal distention, constipation, diarrhea and nausea.  Genitourinary: Positive for dysuria, frequency, pelvic pain and urgency. Negative for difficulty urinating and flank pain.  Musculoskeletal: Negative.   Skin: Negative.       Objective:   Physical Exam  Constitutional: She is oriented to person, place, and time. She appears well-developed and well-nourished.  HENT:  Head: Normocephalic and atraumatic.  Eyes: EOM are normal.  Cardiovascular: Normal rate and regular rhythm.   Pulmonary/Chest: Effort normal and breath sounds normal.  Abdominal: Soft. Bowel sounds are normal. She exhibits no distension. There is tenderness. There is no rebound.  Mild suprapubic tenderness  Neurological: She is alert and oriented to person, place, and time.   Vitals:   10/09/15 1459  BP: (!) 152/92  Pulse: 88  Resp: 14  Temp: 98.2 F (36.8 C)  TempSrc: Oral  SpO2: 98%  Weight: 177 lb (80.3 kg)      Assessment & Plan:

## 2015-10-09 NOTE — Patient Instructions (Signed)
You do have a bladder infection and we have sent in the bactrim medicine. Take 1 pill twice a day for 1 week to clear the infection.   We will send it for culture to make sure it is the right medicine.

## 2015-10-09 NOTE — Telephone Encounter (Signed)
Error

## 2015-10-09 NOTE — Progress Notes (Signed)
Pre visit review using our clinic review tool, if applicable. No additional management support is needed unless otherwise documented below in the visit note. 

## 2015-10-11 NOTE — Assessment & Plan Note (Signed)
Rx for bactrim for the infection. U/A done in office with UTI consistent.

## 2015-10-15 ENCOUNTER — Ambulatory Visit
Admission: RE | Admit: 2015-10-15 | Discharge: 2015-10-15 | Disposition: A | Discharge status: Home / Self Care | Payer: Medicare Other | Source: Ambulatory Visit | Attending: Internal Medicine | Admitting: Internal Medicine

## 2015-10-15 DIAGNOSIS — Z1231 Encounter for screening mammogram for malignant neoplasm of breast: Secondary | ICD-10-CM

## 2015-10-27 DIAGNOSIS — H04123 Dry eye syndrome of bilateral lacrimal glands: Secondary | ICD-10-CM | POA: Diagnosis not present

## 2015-10-27 DIAGNOSIS — H2513 Age-related nuclear cataract, bilateral: Secondary | ICD-10-CM | POA: Diagnosis not present

## 2015-10-27 DIAGNOSIS — H5203 Hypermetropia, bilateral: Secondary | ICD-10-CM | POA: Diagnosis not present

## 2015-10-27 DIAGNOSIS — H52213 Irregular astigmatism, bilateral: Secondary | ICD-10-CM | POA: Diagnosis not present

## 2015-10-28 ENCOUNTER — Telehealth: Payer: Self-pay | Admitting: Emergency Medicine

## 2015-10-28 NOTE — Telephone Encounter (Signed)
Please advise on uti symptoms in Dr. Nathanial Millman absence, thanks

## 2015-10-28 NOTE — Telephone Encounter (Signed)
She will need a follow up UA and culture to confirm she still has an infection. Ok to place UA for her to complete.

## 2015-10-28 NOTE — Telephone Encounter (Signed)
Pt called and stated she would like to get a bone density test done. She is wondering if you can put an order in for one. She also stated she came in for a UTI on 10/5 and is still having symptoms and wants to know what you recommend. Please advise thanks.

## 2015-10-29 ENCOUNTER — Other Ambulatory Visit: Payer: Self-pay | Admitting: Geriatric Medicine

## 2015-10-29 DIAGNOSIS — R309 Painful micturition, unspecified: Secondary | ICD-10-CM

## 2015-10-29 NOTE — Telephone Encounter (Signed)
Patient aware and will come in to leave a sample in the lab. Orders placed for U/C.

## 2015-11-06 DIAGNOSIS — D2371 Other benign neoplasm of skin of right lower limb, including hip: Secondary | ICD-10-CM | POA: Diagnosis not present

## 2015-11-06 DIAGNOSIS — Z86018 Personal history of other benign neoplasm: Secondary | ICD-10-CM | POA: Diagnosis not present

## 2015-11-06 DIAGNOSIS — L821 Other seborrheic keratosis: Secondary | ICD-10-CM | POA: Diagnosis not present

## 2015-11-06 DIAGNOSIS — L720 Epidermal cyst: Secondary | ICD-10-CM | POA: Diagnosis not present

## 2015-11-06 DIAGNOSIS — D225 Melanocytic nevi of trunk: Secondary | ICD-10-CM | POA: Diagnosis not present

## 2015-11-06 DIAGNOSIS — L905 Scar conditions and fibrosis of skin: Secondary | ICD-10-CM | POA: Diagnosis not present

## 2015-11-06 DIAGNOSIS — D485 Neoplasm of uncertain behavior of skin: Secondary | ICD-10-CM | POA: Diagnosis not present

## 2015-11-06 DIAGNOSIS — Z23 Encounter for immunization: Secondary | ICD-10-CM | POA: Diagnosis not present

## 2015-11-06 DIAGNOSIS — L918 Other hypertrophic disorders of the skin: Secondary | ICD-10-CM | POA: Diagnosis not present

## 2015-11-10 DIAGNOSIS — Z96652 Presence of left artificial knee joint: Secondary | ICD-10-CM | POA: Diagnosis not present

## 2015-11-10 DIAGNOSIS — R531 Weakness: Secondary | ICD-10-CM | POA: Diagnosis not present

## 2015-11-10 DIAGNOSIS — R262 Difficulty in walking, not elsewhere classified: Secondary | ICD-10-CM | POA: Diagnosis not present

## 2015-11-10 DIAGNOSIS — M25562 Pain in left knee: Secondary | ICD-10-CM | POA: Diagnosis not present

## 2015-11-13 ENCOUNTER — Other Ambulatory Visit: Payer: Medicare Other

## 2015-11-13 ENCOUNTER — Ambulatory Visit (INDEPENDENT_AMBULATORY_CARE_PROVIDER_SITE_OTHER): Payer: Medicare Other

## 2015-11-13 DIAGNOSIS — R309 Painful micturition, unspecified: Secondary | ICD-10-CM

## 2015-11-13 DIAGNOSIS — Z23 Encounter for immunization: Secondary | ICD-10-CM

## 2015-11-13 DIAGNOSIS — M79642 Pain in left hand: Secondary | ICD-10-CM | POA: Diagnosis not present

## 2015-11-13 DIAGNOSIS — M79641 Pain in right hand: Secondary | ICD-10-CM | POA: Diagnosis not present

## 2015-11-13 DIAGNOSIS — G894 Chronic pain syndrome: Secondary | ICD-10-CM | POA: Diagnosis not present

## 2015-11-13 DIAGNOSIS — Z79899 Other long term (current) drug therapy: Secondary | ICD-10-CM | POA: Diagnosis not present

## 2015-11-15 LAB — CULTURE, URINE COMPREHENSIVE: Organism ID, Bacteria: NO GROWTH

## 2015-11-16 ENCOUNTER — Encounter: Payer: Self-pay | Admitting: Family

## 2015-11-18 ENCOUNTER — Ambulatory Visit (AMBULATORY_SURGERY_CENTER): Payer: Self-pay | Admitting: *Deleted

## 2015-11-18 VITALS — Ht 61.5 in | Wt 182.0 lb

## 2015-11-18 DIAGNOSIS — M25562 Pain in left knee: Secondary | ICD-10-CM | POA: Diagnosis not present

## 2015-11-18 DIAGNOSIS — Z1211 Encounter for screening for malignant neoplasm of colon: Secondary | ICD-10-CM

## 2015-11-18 DIAGNOSIS — R531 Weakness: Secondary | ICD-10-CM | POA: Diagnosis not present

## 2015-11-18 DIAGNOSIS — Z96652 Presence of left artificial knee joint: Secondary | ICD-10-CM | POA: Diagnosis not present

## 2015-11-18 DIAGNOSIS — R262 Difficulty in walking, not elsewhere classified: Secondary | ICD-10-CM | POA: Diagnosis not present

## 2015-11-18 MED ORDER — NA SULFATE-K SULFATE-MG SULF 17.5-3.13-1.6 GM/177ML PO SOLN: 1.0000 | Freq: Once | ORAL | 0 refills | Status: AC

## 2015-11-18 NOTE — Progress Notes (Signed)
No egg or soy allergy known to patient  No issues with past sedation with any surgeries  or procedures, no intubation problems --pt has increased shakes after general anesthesia  No diet pills per patient No home 02 use per patient  No blood thinners per patient  Pt denies issues with constipation as long as she takes her miralax --will instruct to take daily before colon  No A fib or A flutter

## 2015-11-20 DIAGNOSIS — R262 Difficulty in walking, not elsewhere classified: Secondary | ICD-10-CM | POA: Diagnosis not present

## 2015-11-20 DIAGNOSIS — M25562 Pain in left knee: Secondary | ICD-10-CM | POA: Diagnosis not present

## 2015-11-20 DIAGNOSIS — Z96652 Presence of left artificial knee joint: Secondary | ICD-10-CM | POA: Diagnosis not present

## 2015-11-20 DIAGNOSIS — R531 Weakness: Secondary | ICD-10-CM | POA: Diagnosis not present

## 2015-11-25 DIAGNOSIS — R262 Difficulty in walking, not elsewhere classified: Secondary | ICD-10-CM | POA: Diagnosis not present

## 2015-11-25 DIAGNOSIS — R531 Weakness: Secondary | ICD-10-CM | POA: Diagnosis not present

## 2015-11-25 DIAGNOSIS — M25562 Pain in left knee: Secondary | ICD-10-CM | POA: Diagnosis not present

## 2015-11-25 DIAGNOSIS — Z96652 Presence of left artificial knee joint: Secondary | ICD-10-CM | POA: Diagnosis not present

## 2015-12-01 ENCOUNTER — Encounter: Payer: Self-pay | Admitting: Gastroenterology

## 2015-12-01 ENCOUNTER — Ambulatory Visit (AMBULATORY_SURGERY_CENTER): Payer: Medicare Other | Admitting: Gastroenterology

## 2015-12-01 VITALS — BP 147/77 | HR 82 | Temp 98.2°F | Resp 11 | Ht 61.5 in | Wt 182.0 lb

## 2015-12-01 DIAGNOSIS — Z1212 Encounter for screening for malignant neoplasm of rectum: Secondary | ICD-10-CM

## 2015-12-01 DIAGNOSIS — K649 Unspecified hemorrhoids: Secondary | ICD-10-CM | POA: Diagnosis not present

## 2015-12-01 DIAGNOSIS — Z1211 Encounter for screening for malignant neoplasm of colon: Secondary | ICD-10-CM

## 2015-12-01 DIAGNOSIS — K625 Hemorrhage of anus and rectum: Secondary | ICD-10-CM | POA: Diagnosis not present

## 2015-12-01 DIAGNOSIS — K5730 Diverticulosis of large intestine without perforation or abscess without bleeding: Secondary | ICD-10-CM | POA: Diagnosis not present

## 2015-12-01 DIAGNOSIS — E669 Obesity, unspecified: Secondary | ICD-10-CM | POA: Diagnosis not present

## 2015-12-01 MED ORDER — SODIUM CHLORIDE 0.9 % IV SOLN: 500.0000 mL | INTRAVENOUS | Status: DC

## 2015-12-01 NOTE — Progress Notes (Signed)
Report given to PACU RN, vss 

## 2015-12-01 NOTE — Op Note (Signed)
[facility] Patient Name: Rhonda Henry Procedure Date: 12/01/2015 7:55 AM MRN: [id number] Endoscopist: Milus Banister , MD Age: 70 Referring MD:  Date of Birth: 04-05-1945 Gender: Female Account #: [id number] Procedure:                Colonoscopy Indications:              Screening for colorectal malignant neoplasm                            (colonoscopy 2006 Dr. Earlean Shawl, no polyps or cancers) Medicines:                Monitored Anesthesia Care Procedure:                Pre-Anesthesia Assessment:                           - Prior to the procedure, a History and Physical                            was performed, and patient medications and                            allergies were reviewed. The patient's tolerance of                            previous anesthesia was also reviewed. The risks                            and benefits of the procedure and the sedation                            options and risks were discussed with the patient.                            All questions were answered, and informed consent                            was obtained. Prior Anticoagulants: The patient has                            taken no previous anticoagulant or antiplatelet                            agents. ASA Grade Assessment: II - A patient with                            mild systemic disease. After reviewing the risks                            and benefits, the patient was deemed in                            satisfactory condition to undergo the procedure.  After obtaining informed consent, the colonoscope                            was passed under direct vision. Throughout the                            procedure, the patient's blood pressure, pulse, and                            oxygen saturations were monitored continuously. The                            Model CF-HQ190L [id number]) scope was introduced                            through  the anus and advanced to the the cecum,                            identified by appendiceal orifice and ileocecal                            valve. The colonoscopy was performed without                            difficulty. The patient tolerated the procedure                            well. The quality of the bowel preparation was                            excellent. The ileocecal valve, appendiceal                            orifice, and rectum were photographed. Scope In: 7:59:20 AM Scope Out: 8:08:31 AM Scope Withdrawal Time: 0 hours 6 minutes 23 seconds  Total Procedure Duration: 0 hours 9 minutes 11 seconds  Findings:                 Multiple small-mouthed diverticula were found in                            the left colon.                           External and internal hemorrhoids were found. The                            hemorrhoids were small.                           The exam was otherwise without abnormality on                            direct and retroflexion views. Complications:            No immediate complications. Estimated blood  loss:                            None. Estimated Blood Loss:     Estimated blood loss: none. Impression:               - Diverticulosis in the left colon.                           - External and internal hemorrhoids.                           - The examination was otherwise normal on direct                            and retroflexion views.                           - No specimens collected. Recommendation:           - Patient has a contact number available for                            emergencies. The signs and symptoms of potential                            delayed complications were discussed with the                            patient. Return to normal activities tomorrow.                            Written discharge instructions were provided to the                            patient.                           - Resume previous  diet.                           - Continue present medications.                           - Repeat colonoscopy in 10 years for screening                            purposes. There is no need for colon cancer                            screening by any method (including stool testing)                            prior to then.                           - Please start daily fiber supplement such as  citrucel orange powder for your constipation. Milus Banister, MD 12/01/2015 8:12:18 AM This report has been signed electronically.

## 2015-12-01 NOTE — Patient Instructions (Signed)
YOU HAD AN ENDOSCOPIC PROCEDURE TODAY AT [facility]:   Refer to the procedure report that was given to you for any specific questions about what was found during the examination.  If the procedure report does not answer your questions, please call your gastroenterologist to clarify.  If you requested that your care partner not be given the details of your procedure findings, then the procedure report has been included in a sealed envelope for you to review at your convenience later.  YOU SHOULD EXPECT: Some feelings of bloating in the abdomen. Passage of more gas than usual.  Walking can help get rid of the air that was put into your GI tract during the procedure and reduce the bloating. If you had a lower endoscopy (such as a colonoscopy or flexible sigmoidoscopy) you may notice spotting of blood in your stool or on the toilet paper. If you underwent a bowel prep for your procedure, you may not have a normal bowel movement for a few days.  Please Note:  You might notice some irritation and congestion in your nose or some drainage.  This is from the oxygen used during your procedure.  There is no need for concern and it should clear up in a day or so.  SYMPTOMS TO REPORT IMMEDIATELY:   Following lower endoscopy (colonoscopy or flexible sigmoidoscopy):  Excessive amounts of blood in the stool  Significant tenderness or worsening of abdominal pains  Swelling of the abdomen that is new, acute  Fever of 100F or higher  For urgent or emergent issues, a gastroenterologist can be reached at any hour by calling [phone].  DIET:  We do recommend a small meal at first, but then you may proceed to your regular diet.  Drink plenty of fluids but you should avoid alcoholic beverages for 24 hours.  ACTIVITY:  You should plan to take it easy for the rest of today and you should NOT DRIVE or use heavy machinery until tomorrow (because of the sedation medicines used during the test).     FOLLOW UP: Our staff will call the number listed on your records the next business day following your procedure to check on you and address any questions or concerns that you may have regarding the information given to you following your procedure. If we do not reach you, we will leave a message.  However, if you are feeling well and you are not experiencing any problems, there is no need to return our call.  We will assume that you have returned to your regular daily activities without incident.  SIGNATURES/CONFIDENTIALITY: You and/or your care partner have signed paperwork which will be entered into your electronic medical record.  These signatures attest to the fact that that the information above on your After Visit Summary has been reviewed and is understood.  Full responsibility of the confidentiality of this discharge information lies with you and/or your care-partner.  Please read over handouts about diverticulosis, hemorrhoids and high fiber diets  Please continue your normal medications  Please follow Dr. Ardis Hughs' recommendations about fiber supplements

## 2015-12-02 ENCOUNTER — Telehealth: Payer: Self-pay

## 2015-12-02 NOTE — Telephone Encounter (Signed)
  Follow up Call-  Call back number 12/01/2015  Post procedure Call Back phone  # [phone]  Permission to leave phone message Yes  Some recent data might be hidden     Patient questions:  Do you have a fever, pain , or abdominal swelling? No. Pain Score  0 *  Have you tolerated food without any problems? Yes.    Have you been able to return to your normal activities? Yes.    Do you have any questions about your discharge instructions: Diet   No. Medications  No. Follow up visit  No.  Do you have questions or concerns about your Care? No.  Actions: * If pain score is 4 or above: No action needed, pain <4.

## 2015-12-05 ENCOUNTER — Encounter: Payer: Self-pay | Admitting: Internal Medicine

## 2015-12-05 ENCOUNTER — Ambulatory Visit (INDEPENDENT_AMBULATORY_CARE_PROVIDER_SITE_OTHER): Payer: Medicare Other | Admitting: Internal Medicine

## 2015-12-05 DIAGNOSIS — Z7189 Other specified counseling: Secondary | ICD-10-CM | POA: Diagnosis not present

## 2015-12-05 DIAGNOSIS — Z471 Aftercare following joint replacement surgery: Secondary | ICD-10-CM | POA: Diagnosis not present

## 2015-12-05 DIAGNOSIS — Z7184 Encounter for health counseling related to travel: Secondary | ICD-10-CM | POA: Insufficient documentation

## 2015-12-05 DIAGNOSIS — M25561 Pain in right knee: Secondary | ICD-10-CM | POA: Diagnosis not present

## 2015-12-05 DIAGNOSIS — M7632 Iliotibial band syndrome, left leg: Secondary | ICD-10-CM | POA: Diagnosis not present

## 2015-12-05 DIAGNOSIS — Z96652 Presence of left artificial knee joint: Secondary | ICD-10-CM | POA: Diagnosis not present

## 2015-12-05 MED ORDER — TYPHOID VACCINE PO CPDR: 1.0000 | DELAYED_RELEASE_CAPSULE | ORAL | 0 refills | Status: DC

## 2015-12-05 MED ORDER — CIPROFLOXACIN HCL 500 MG PO TABS: 500.0000 mg | ORAL_TABLET | Freq: Two times a day (BID) | ORAL | 0 refills | Status: DC

## 2015-12-05 NOTE — Progress Notes (Signed)
Pre visit review using our clinic review tool, if applicable. No additional management support is needed unless otherwise documented below in the visit note. 

## 2015-12-05 NOTE — Patient Instructions (Signed)
We have given you the hepatitis A shot today.  We have sent in the typhoid vaccine for you to take. It is a pill that you take every other day until gone (4 pills) at least 1 week before you leave. It is good for 5 years.   Have a great trip and happy holidays!

## 2015-12-05 NOTE — Progress Notes (Signed)
   Subjective:    Patient ID: Rhonda Henry, female    DOB: [birth year], 70 y.o.   MRN: [id number]  HPI The patient is a 71 YO female coming in for travel advice. She is going to [place] for about 1 month starting in February. She is going to be at some small towns but no adventurous eating, not staying with relatives. She has already been looking at the cdc website for packing suggestions.   Review of Systems  Constitutional: Negative.   Respiratory: Negative.   Cardiovascular: Negative.   Gastrointestinal: Negative.   Musculoskeletal: Negative.   Skin: Negative.       Objective:   Physical Exam  Constitutional: She is oriented to person, place, and time. She appears well-developed and well-nourished.  HENT:  Head: Normocephalic and atraumatic.  Cardiovascular: Normal rate and regular rhythm.   Pulmonary/Chest: Effort normal and breath sounds normal.  Abdominal: Soft. She exhibits no distension. There is no tenderness. There is no rebound.  Neurological: She is alert and oriented to person, place, and time.  Skin: Skin is warm and dry.   Vitals:   12/05/15 1338  BP: 130/78  Pulse: 87  Resp: 16  Temp: 98.3 F (36.8 C)  TempSrc: Oral  SpO2: 95%  Weight: 182 lb 1.6 oz (82.6 kg)  Height: 5\' 2"  (1.575 m)      Assessment & Plan:  Hep A given at visit.

## 2015-12-05 NOTE — Assessment & Plan Note (Signed)
Given hep a today, hep b as child, other immunizations up to date. Sent in rx for typhoid oral vaccine to take at least 1 week prior to departure. Counseled about mosquito avoidance and safety measures. Sent in rx for cipro for any travel related infections.

## 2016-01-07 DIAGNOSIS — M1712 Unilateral primary osteoarthritis, left knee: Secondary | ICD-10-CM | POA: Diagnosis not present

## 2016-01-12 DIAGNOSIS — M1712 Unilateral primary osteoarthritis, left knee: Secondary | ICD-10-CM | POA: Diagnosis not present

## 2016-01-15 ENCOUNTER — Telehealth: Payer: Self-pay

## 2016-01-15 DIAGNOSIS — M228X1 Other disorders of patella, right knee: Secondary | ICD-10-CM | POA: Diagnosis not present

## 2016-01-15 DIAGNOSIS — M1711 Unilateral primary osteoarthritis, right knee: Secondary | ICD-10-CM | POA: Diagnosis not present

## 2016-01-15 MED ORDER — LOSARTAN POTASSIUM 50 MG PO TABS: 50.0000 mg | ORAL_TABLET | Freq: Every day | ORAL | 1 refills | Status: DC

## 2016-01-15 NOTE — Telephone Encounter (Signed)
[facility] requested refill for losartan 50mg , called patient to confirm pharmacy and medication was refilled

## 2016-01-23 DIAGNOSIS — M1711 Unilateral primary osteoarthritis, right knee: Secondary | ICD-10-CM | POA: Diagnosis not present

## 2016-01-26 DIAGNOSIS — M1712 Unilateral primary osteoarthritis, left knee: Secondary | ICD-10-CM | POA: Diagnosis not present

## 2016-02-03 DIAGNOSIS — M1712 Unilateral primary osteoarthritis, left knee: Secondary | ICD-10-CM | POA: Diagnosis not present

## 2016-02-09 ENCOUNTER — Ambulatory Visit: Payer: Medicare Other | Admitting: Internal Medicine

## 2016-02-10 DIAGNOSIS — L089 Local infection of the skin and subcutaneous tissue, unspecified: Secondary | ICD-10-CM | POA: Diagnosis not present

## 2016-02-10 DIAGNOSIS — R030 Elevated blood-pressure reading, without diagnosis of hypertension: Secondary | ICD-10-CM | POA: Diagnosis not present

## 2016-02-10 DIAGNOSIS — S90812A Abrasion, left foot, initial encounter: Secondary | ICD-10-CM | POA: Diagnosis not present

## 2016-03-15 ENCOUNTER — Encounter: Payer: Self-pay | Admitting: Internal Medicine

## 2016-03-15 ENCOUNTER — Ambulatory Visit (INDEPENDENT_AMBULATORY_CARE_PROVIDER_SITE_OTHER): Payer: Medicare Other | Admitting: Internal Medicine

## 2016-03-15 VITALS — BP 138/78 | HR 82 | Temp 97.3°F | Ht 62.0 in | Wt 186.0 lb

## 2016-03-15 DIAGNOSIS — N3000 Acute cystitis without hematuria: Secondary | ICD-10-CM | POA: Diagnosis not present

## 2016-03-15 DIAGNOSIS — R300 Dysuria: Secondary | ICD-10-CM

## 2016-03-15 LAB — POC URINALSYSI DIPSTICK (AUTOMATED)
BILIRUBIN UA: NEGATIVE
GLUCOSE UA: NEGATIVE
KETONES UA: NEGATIVE
Leukocytes, UA: NEGATIVE
NITRITE UA: NEGATIVE
PH UA: 5.5
Protein, UA: NEGATIVE
Spec Grav, UA: 1.035
Urobilinogen, UA: 0.2

## 2016-03-15 MED ORDER — SULFAMETHOXAZOLE-TRIMETHOPRIM 800-160 MG PO TABS: 1.0000 | ORAL_TABLET | Freq: Two times a day (BID) | ORAL | 0 refills | Status: DC

## 2016-03-15 NOTE — Progress Notes (Signed)
   Subjective:    Patient ID: Rhonda Henry, female    DOB: [birth year], 71 y.o.   MRN: [id number]  HPI The patient is a 71 YO female coming in for UTI symptoms of burning and urgency for about 1 week or so. She just returned from [place] on vacation for about 1 month. She had tried some clindamycin which she had left over from a dental appointment previously. She denies fevers or chills. She denies abdominal pain but some pressure. Overall symptoms are stable to worsening slightly. Trying to drink some water and tried to take some azo while traveling back.   Review of Systems  Constitutional: Positive for activity change. Negative for appetite change, chills, fatigue, fever and unexpected weight change.  Respiratory: Negative.   Cardiovascular: Negative.   Gastrointestinal: Negative.  Negative for abdominal distention, abdominal pain, constipation, diarrhea, nausea and vomiting.  Genitourinary: Positive for dysuria, frequency and urgency. Negative for decreased urine volume, difficulty urinating, flank pain, hematuria, pelvic pain, vaginal bleeding and vaginal discharge.  Musculoskeletal: Negative.   Skin: Negative.       Objective:   Physical Exam  Constitutional: She is oriented to person, place, and time. She appears well-developed and well-nourished.  HENT:  Head: Normocephalic and atraumatic.  Eyes: EOM are normal.  Neck: Normal range of motion.  Cardiovascular: Normal rate and regular rhythm.   Pulmonary/Chest: Effort normal and breath sounds normal.  Abdominal: Soft. Bowel sounds are normal. She exhibits no distension. There is no tenderness. There is no rebound.  Musculoskeletal: She exhibits no edema.  Neurological: She is alert and oriented to person, place, and time.  Skin: Skin is warm and dry.   Vitals:   03/15/16 0945  BP: 138/78  Pulse: 82  Temp: 97.3 F (36.3 C)  TempSrc: Oral  SpO2: 98%  Weight: 186 lb (84.4 kg)  Height: 5\' 2"  (1.575 m)        Assessment & Plan:

## 2016-03-15 NOTE — Patient Instructions (Signed)
We have sent in the bactrim for the bladder. Take 1 pill twice a day for 1 week.

## 2016-03-15 NOTE — Progress Notes (Signed)
Pre visit review using our clinic review tool, if applicable. No additional management support is needed unless otherwise documented below in the visit note. 

## 2016-03-16 NOTE — Assessment & Plan Note (Signed)
Rx for bactrim for 1 week, U/a done in the office with signs of infection.

## 2016-03-26 DIAGNOSIS — H1131 Conjunctival hemorrhage, right eye: Secondary | ICD-10-CM | POA: Diagnosis not present

## 2016-04-30 DIAGNOSIS — G894 Chronic pain syndrome: Secondary | ICD-10-CM | POA: Diagnosis not present

## 2016-04-30 DIAGNOSIS — M79642 Pain in left hand: Secondary | ICD-10-CM | POA: Diagnosis not present

## 2016-04-30 DIAGNOSIS — M79641 Pain in right hand: Secondary | ICD-10-CM | POA: Diagnosis not present

## 2016-04-30 DIAGNOSIS — Z79899 Other long term (current) drug therapy: Secondary | ICD-10-CM | POA: Diagnosis not present

## 2016-05-05 ENCOUNTER — Ambulatory Visit (INDEPENDENT_AMBULATORY_CARE_PROVIDER_SITE_OTHER)
Admission: RE | Admit: 2016-05-05 | Discharge: 2016-05-05 | Disposition: A | Discharge status: Home / Self Care | Payer: Medicare Other | Source: Ambulatory Visit | Attending: Internal Medicine | Admitting: Internal Medicine

## 2016-05-05 ENCOUNTER — Ambulatory Visit (INDEPENDENT_AMBULATORY_CARE_PROVIDER_SITE_OTHER): Payer: Medicare Other | Admitting: Internal Medicine

## 2016-05-05 ENCOUNTER — Encounter: Payer: Self-pay | Admitting: Internal Medicine

## 2016-05-05 VITALS — BP 154/86 | HR 72 | Temp 98.1°F | Resp 14 | Ht 62.0 in | Wt 188.0 lb

## 2016-05-05 DIAGNOSIS — R059 Cough, unspecified: Secondary | ICD-10-CM

## 2016-05-05 DIAGNOSIS — R7301 Impaired fasting glucose: Secondary | ICD-10-CM

## 2016-05-05 DIAGNOSIS — E789 Disorder of lipoprotein metabolism, unspecified: Secondary | ICD-10-CM

## 2016-05-05 DIAGNOSIS — R05 Cough: Secondary | ICD-10-CM

## 2016-05-05 DIAGNOSIS — E538 Deficiency of other specified B group vitamins: Secondary | ICD-10-CM

## 2016-05-05 DIAGNOSIS — R5383 Other fatigue: Secondary | ICD-10-CM | POA: Diagnosis not present

## 2016-05-05 DIAGNOSIS — I10 Essential (primary) hypertension: Secondary | ICD-10-CM

## 2016-05-05 DIAGNOSIS — H60392 Other infective otitis externa, left ear: Secondary | ICD-10-CM | POA: Diagnosis not present

## 2016-05-05 DIAGNOSIS — H6092 Unspecified otitis externa, left ear: Secondary | ICD-10-CM | POA: Insufficient documentation

## 2016-05-05 MED ORDER — LOSARTAN POTASSIUM-HCTZ 50-12.5 MG PO TABS: 1.0000 | ORAL_TABLET | Freq: Every day | ORAL | 3 refills | Status: DC

## 2016-05-05 MED ORDER — NEOMYCIN-POLYMYXIN-HC 3.5-10000-1 OT SUSP: 3.0000 [drp] | Freq: Three times a day (TID) | OTIC | 0 refills | Status: DC

## 2016-05-05 NOTE — Assessment & Plan Note (Addendum)
Checking CXR, reassurance given that CT chest normal about 1 year ago. Not likely to be UACS due to lack of symptoms but could be related to her GERD given that she has changed to nexium every other day in the last 6 months which is when symptoms have started. If CXR normal increase nexium to daily for 1 month to see if this helps.

## 2016-05-05 NOTE — Progress Notes (Signed)
   Subjective:    Patient ID: Rhonda Henry, female    DOB: [birth year], 71 y.o.   MRN: [id number]  HPI The patient is a 71 YO female coming in for several concerns including chronic cough (going on for months, several friends with lung cancer recently, never smoker, has GERD and taking every other day currently with rare symptoms only with foods, denies allergies or post nasal drip, denies SOB), and her blood pressure (has been high several places lately and she feels it getting high at home sometimes, taking losartan 50 mg daily and has been on for a long time, denies headaches, SOB, chest pains), and her left ear discomfort (going on for months and still feels like there is fluid in the ear, no hearing changes, denies allergy drainage, no fevers or chills).   Review of Systems  Constitutional: Positive for fatigue. Negative for activity change, appetite change, diaphoresis, fever and unexpected weight change.  HENT: Positive for ear pain. Negative for congestion, ear discharge, mouth sores, nosebleeds, postnasal drip, rhinorrhea, sore throat, tinnitus and trouble swallowing.   Eyes: Negative.   Respiratory: Positive for cough. Negative for chest tightness, shortness of breath, wheezing and stridor.   Cardiovascular: Negative.   Gastrointestinal: Negative.   Musculoskeletal: Negative.   Skin: Negative.   Neurological: Negative.   Psychiatric/Behavioral: Negative.       Objective:   Physical Exam  Constitutional: She is oriented to person, place, and time. She appears well-developed and well-nourished.  HENT:  Head: Normocephalic and atraumatic.  Right ear TM normal, left ear with otitis externa, oropharynx without redness or drainage.   Eyes: EOM are normal.  Neck: No JVD present.  Cardiovascular: Normal rate and regular rhythm.   Pulmonary/Chest: Effort normal and breath sounds normal. No respiratory distress. She has no wheezes. She has no rales.  Abdominal: Soft.    Musculoskeletal: She exhibits edema.  1+ non-pitting edema.  Lymphadenopathy:    She has no cervical adenopathy.  Neurological: She is alert and oriented to person, place, and time. Coordination normal.  Skin: Skin is warm and dry.   Vitals:   05/05/16 1101  BP: (!) 154/86  Pulse: 72  Resp: 14  Temp: 98.1 F (36.7 C)  TempSrc: Oral  SpO2: 99%  Weight: 188 lb (85.3 kg)  Height: 5\' 2"  (1.575 m)      Assessment & Plan:

## 2016-05-05 NOTE — Progress Notes (Signed)
Pre visit review using our clinic review tool, if applicable. No additional management support is needed unless otherwise documented below in the visit note. 

## 2016-05-05 NOTE — Assessment & Plan Note (Signed)
BP elevated and has been several times lately. Has done well with losartan/hctz in the past and will change to losartan/hctz 50/12.5 mg daily for better control.

## 2016-05-05 NOTE — Assessment & Plan Note (Signed)
Rx for cortisporin for symptoms.

## 2016-05-05 NOTE — Patient Instructions (Addendum)
We have sent in the losartan/hctz to the pharmacy to take 1 pill daily.   We will check the x-ray today and call back with the results. If there is nothing there try taking nexium daily for 1 month to see if this makes a difference.   The ear drops use 3 drops in the ear 3 times a day for 3 days to help.

## 2016-05-06 DIAGNOSIS — Z471 Aftercare following joint replacement surgery: Secondary | ICD-10-CM | POA: Diagnosis not present

## 2016-05-06 DIAGNOSIS — Z96652 Presence of left artificial knee joint: Secondary | ICD-10-CM | POA: Diagnosis not present

## 2016-05-06 DIAGNOSIS — M1711 Unilateral primary osteoarthritis, right knee: Secondary | ICD-10-CM | POA: Diagnosis not present

## 2016-06-24 DIAGNOSIS — M25562 Pain in left knee: Secondary | ICD-10-CM | POA: Diagnosis not present

## 2016-06-24 DIAGNOSIS — G8929 Other chronic pain: Secondary | ICD-10-CM | POA: Diagnosis not present

## 2016-06-27 ENCOUNTER — Other Ambulatory Visit: Payer: Self-pay | Admitting: Internal Medicine

## 2016-06-28 ENCOUNTER — Telehealth: Payer: Self-pay | Admitting: Gastroenterology

## 2016-06-28 NOTE — Telephone Encounter (Signed)
Pt has constipation and has not had a BM in 4 days,  This is not new to her.  She has been taking miralax 1 capful daily.  I advised her to increase to 2 capfuls until she has a bowel movement.  She agreed and will call back if no results.  She was also added to the schedule to see Dr Ardis Hughs 8/14.

## 2016-07-26 ENCOUNTER — Other Ambulatory Visit: Payer: Self-pay

## 2016-08-17 ENCOUNTER — Ambulatory Visit (INDEPENDENT_AMBULATORY_CARE_PROVIDER_SITE_OTHER): Payer: Medicare Other | Admitting: Gastroenterology

## 2016-08-17 ENCOUNTER — Encounter: Payer: Self-pay | Admitting: Gastroenterology

## 2016-08-17 VITALS — BP 124/80 | HR 88 | Ht 60.5 in | Wt 181.4 lb

## 2016-08-17 DIAGNOSIS — E6609 Other obesity due to excess calories: Secondary | ICD-10-CM | POA: Diagnosis not present

## 2016-08-17 DIAGNOSIS — K5900 Constipation, unspecified: Secondary | ICD-10-CM | POA: Diagnosis not present

## 2016-08-17 DIAGNOSIS — Z6834 Body mass index (BMI) 34.0-34.9, adult: Secondary | ICD-10-CM | POA: Diagnosis not present

## 2016-08-17 MED ORDER — LINACLOTIDE 72 MCG PO CAPS: 72.0000 ug | ORAL_CAPSULE | Freq: Every day | ORAL | 11 refills | Status: DC

## 2016-08-17 NOTE — Patient Instructions (Addendum)
New dose of linzess 38mcg pill, take one pill every morning with food. Disp 30 with 11 refills.   Please start taking citrucel (orange flavored) powder fiber supplement.  This may cause some bloating at first but that usually goes away. Begin with a small spoonful and work your way up to a large, heaping spoonful daily over a week.  Call in 4-5 weeks to report on your progress.   You will have labs checked today in the basement lab.  Please head down after you check out with the front desk  (cbc, cmet, TSH, lipid panel).  Normal BMI (Body Mass Index- based on height and weight) is between 23 and 30. Your BMI today is Body mass index is 34.84 kg/m. Marland Kitchen Please consider follow up  regarding your BMI with your Primary Care Provider.

## 2016-08-17 NOTE — Progress Notes (Signed)
Review of pertinent gastrointestinal problems: 1. Routine risk for colon cancer: Colonoscopy Dr. Ardis Hughs 11/2015 found no polyps. Colonoscopy Dr. Earlean Shawl 2006 also found no polyps. 2. Hemorrhoids and left colon diverticulosis noted on colonoscopy 2017. 3. Constipation: trial of linzess 2016 under care of Dr. Deatra Ina.   HPI: This is a very pleasant 71 year old woman whom I last saw at the time of colonoscopy about a year ago.  She has had trouble witn constipation for a long time.    She tried linzess, one pill one time it resulted in extreme loose stools, 145 g strength  She was very constipated in [place] this past winter. Tried a lot of miralax. And Colace and this eventually helped.  She has to really push a lot usually when she is having a bowel movement. She can spend 2-3 hours once she starts finally moving her bowels after a long spell of constipation.  For the past 13 weeks she's been really dieting.  She joined [employer], never lost a single pound.  She retried linzess and that helped her bowels, lost 2 pounds.  She has been excercising a lot, really feels she's only been taking in 1100 calories.  She has "only last 6 pounds" in 13 weeks.  Taking one dose of miralax every day and one colace.  Has started taking a single linzess once a week but this is too potent for her.  Too loose.  She has a 'my fitness pall'  Chief complaint is chronic constipation, inability to lose weight  ROS: complete GI ROS as described in HPI, all other review negative.  Weight is down 8 pounds in two years (same scale here in GI office).  Constitutional:  No unintentional weight loss   Past Medical History:  Diagnosis Date  . Abdominal pain, acute, right upper quadrant    had gall bladder removed and now no issues   . Allergy   . Anxiety   . Arthritis   . Bloating   . Complication of anesthesia    "I shake like crazy"  . Constipation    not now  . Depression    no issues  currently   . Difficulty sleeping    no issues now  . GERD (gastroesophageal reflux disease)   . Heart murmur    no recent mention of this per pt.   . Hypertension   . Microscopic hematuria     x 30 yrs   . Nausea   . Nerve damage    face -1 yr ago and now almost resolved   . Neuromuscular disorder ([place])   . Recurrent UTI     Past Surgical History:  Procedure Laterality Date  . breast biopsies    . BREAST LUMPECTOMY     x 2  . c sections     x2  . CHOLECYSTECTOMY N/A 03/13/2013   Procedure: LAPAROSCOPIC CHOLECYSTECTOMY WITH INTRAOPERATIVE CHOLANGIOGRAM;  Surgeon: [name]. [name], MD;  Location: [facility];  Service: General;  Laterality: N/A;  . COLONOSCOPY    . CYST EXCISION     removed from back and left arm  . FACIAL COSMETIC SURGERY  7-10   compl by thick scars and a scar necrosis on R cheek  . LUMBAR EPIDURAL INJECTION  Jan 15   . [place]  . 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 Alpha-Lipoic Acid 600 MG CAPS Take 1 capsule by mouth daily.    . clindamycin (CLEOCIN) 150 MG capsule Take 150 mg by mouth as needed (Take after sex.).     Marland Kitchen diazepam (VALIUM) 5 MG tablet Take 5 mg by mouth at bedtime as needed.     . docusate sodium (COLACE) 100 MG capsule Take 100 mg by mouth daily.    Marland Kitchen esomeprazole (NEXIUM) 20 MG capsule Take 20 mg by mouth daily.     Marland Kitchen losartan-hydrochlorothiazide (HYZAAR) 50-12.5 MG tablet Take 1 tablet by mouth daily. 90 tablet 3  . nortriptyline (PAMELOR) 10 MG capsule Take 10 mg by mouth at bedtime.  3  . nortriptyline (PAMELOR) 25 MG capsule Take 25 mg by mouth at bedtime.  3  . polyethylene glycol powder (GLYCOLAX/MIRALAX) powder Take 17 g by mouth daily.     No current facility-administered medications for this  visit.     Allergies as of 08/17/2016 - Review Complete 08/17/2016  Allergen Reaction Noted  . Other Other (See Comments) and Hives 05/10/2013  . Desipramine hcl Rash   . Penicillins Hives and Rash   . Tape Hives 03/30/2013    Family History  Problem Relation Age of Onset  . Thyroid cancer Mother   . Heart disease Mother   . Macular degeneration Mother   . Stroke Mother   . Hypertension Mother   . Glaucoma Father   . Hypertension Other   . Stomach cancer Paternal Grandmother   . Colon cancer Neg Hx   . Colon polyps Neg Hx   . Rectal cancer Neg Hx   . Pancreatic cancer Neg Hx     Social History   Social History  . Marital status: Married    Spouse name: Herbie Baltimore  . Number of children: 2  . Years of education: 16   Occupational History  . Publishing rights manager    owns own business   Social History Main Topics  . Smoking status: Never Smoker  . Smokeless tobacco: Never Used  . Alcohol use 0.0 oz/week     Comment: socially 1-2 glasses weekly  . Drug use: No  . Sexual activity: Yes    Birth control/ protection: Other-see comments   Other Topics Concern  . Not on file   Social History Narrative   Lives with husband in a 2 story home.  Has 2 grown children.  Owns her own business.  Education: Forensic psychologist.      Physical Exam: BP 124/80 (BP Location: Left Arm, Patient Position: Sitting, Cuff Size: Normal)   Pulse 88   Ht 5' 0.5" (1.537 m) Comment: height measured without shoes  Wt 181 lb 6 oz (82.3 kg)   BMI 34.84 kg/m  Constitutional: generally well-appearing Psychiatric: alert and oriented x3 Abdomen: soft, nontender, nondistended, no obvious ascites, no peritoneal signs, normal bowel sounds No peripheral edema noted in lower extremities  Assessment and plan: 71 y.o. female with Chronic constipation, inability to lose weight  First I recommended she stop her daily MiraLAX and daily Colace. Instead she is going to start low strength once daily  linzess she will also start daily powder fiber supplement. She will call to report on her response in 4-5 weeks.  She had a colonoscopy within the last year and that does not need to be repeated. She is also concerned that she is really making a concerted effort with  dieting and exercising more and is failing to lose as much weight as she had hoped to. She has however lost 6 pounds the past 13 weeks. She would like a metabolic panel ordered to see if there is something else going on. I recommended CBC, complete metabolic profile, lipid panel and TSH testing. If these are all normal but I will likely refer her to a dietitian to see if there are particular foods, dietary habits she should avoid.Marland Kitchen    Please see the "Patient Instructions" section for addition details about the plan.  Owens Loffler, MD [facility] 08/17/2016, 4:05 PM

## 2016-08-18 ENCOUNTER — Other Ambulatory Visit (INDEPENDENT_AMBULATORY_CARE_PROVIDER_SITE_OTHER): Payer: Medicare Other

## 2016-08-18 DIAGNOSIS — K5900 Constipation, unspecified: Secondary | ICD-10-CM | POA: Diagnosis not present

## 2016-08-18 LAB — LIPID PANEL
Cholesterol: 173 mg/dL (ref 0–200)
HDL: 42.8 mg/dL (ref >39.00)
LDL CALC: 109 mg/dL — AB (ref 0–99)
NonHDL: 130.24
TRIGLYCERIDES: 105 mg/dL (ref 0.0–149.0)
Total CHOL/HDL Ratio: 4
VLDL: 21 mg/dL (ref 0.0–40.0)

## 2016-08-18 LAB — CBC WITH DIFFERENTIAL/PLATELET
BASOS ABS: 0 10*3/uL (ref 0.0–0.1)
Basophils Relative: 0.7 % (ref 0.0–3.0)
EOS ABS: 0.3 10*3/uL (ref 0.0–0.7)
Eosinophils Relative: 4.5 % (ref 0.0–5.0)
HCT: 39.8 % (ref 36.0–46.0)
Hemoglobin: 13 g/dL (ref 12.0–15.0)
LYMPHS ABS: 2 10*3/uL (ref 0.7–4.0)
Lymphocytes Relative: 32.2 % (ref 12.0–46.0)
MCHC: 32.7 g/dL (ref 30.0–36.0)
MCV: 83.7 fl (ref 78.0–100.0)
Monocytes Absolute: 0.5 10*3/uL (ref 0.1–1.0)
Monocytes Relative: 7.9 % (ref 3.0–12.0)
NEUTROS ABS: 3.5 10*3/uL (ref 1.4–7.7)
NEUTROS PCT: 54.7 % (ref 43.0–77.0)
PLATELETS: 243 10*3/uL (ref 150.0–400.0)
RBC: 4.76 Mil/uL (ref 3.87–5.11)
RDW: 13.7 % (ref 11.5–15.5)
WBC: 6.4 10*3/uL (ref 4.0–10.5)

## 2016-08-18 LAB — COMPREHENSIVE METABOLIC PANEL
ALT: 12 U/L (ref 0–35)
AST: 16 U/L (ref 0–37)
Albumin: 3.9 g/dL (ref 3.5–5.2)
Alkaline Phosphatase: 73 U/L (ref 39–117)
BILIRUBIN TOTAL: 0.6 mg/dL (ref 0.2–1.2)
BUN: 22 mg/dL (ref 6–23)
CO2: 29 meq/L (ref 19–32)
CREATININE: 0.71 mg/dL (ref 0.40–1.20)
Calcium: 9.1 mg/dL (ref 8.4–10.5)
Chloride: 103 mEq/L (ref 96–112)
GFR: 86.12 mL/min (ref >60.00)
GLUCOSE: 87 mg/dL (ref 70–99)
Potassium: 3.8 mEq/L (ref 3.5–5.1)
Sodium: 139 mEq/L (ref 135–145)
Total Protein: 6 g/dL (ref 6.0–8.3)

## 2016-08-18 LAB — TSH: TSH: 3.44 u[IU]/mL (ref 0.35–4.50)

## 2016-08-30 ENCOUNTER — Telehealth: Payer: Self-pay | Admitting: Gastroenterology

## 2016-08-30 DIAGNOSIS — R194 Change in bowel habit: Secondary | ICD-10-CM

## 2016-08-30 DIAGNOSIS — R634 Abnormal weight loss: Secondary | ICD-10-CM

## 2016-08-30 NOTE — Telephone Encounter (Signed)
Left message on machine to call back  

## 2016-08-30 NOTE — Telephone Encounter (Signed)
Referral to nutrition and diabetes sent as requested by pt and mentioned in Dr Ardis Hughs last note

## 2016-09-01 DIAGNOSIS — L820 Inflamed seborrheic keratosis: Secondary | ICD-10-CM | POA: Diagnosis not present

## 2016-09-08 ENCOUNTER — Ambulatory Visit (INDEPENDENT_AMBULATORY_CARE_PROVIDER_SITE_OTHER): Payer: Medicare Other | Admitting: Internal Medicine

## 2016-09-08 ENCOUNTER — Encounter: Payer: Self-pay | Admitting: Internal Medicine

## 2016-09-08 ENCOUNTER — Other Ambulatory Visit: Payer: Medicare Other

## 2016-09-08 VITALS — BP 134/82 | HR 80 | Temp 98.9°F | Ht 60.5 in | Wt 181.0 lb

## 2016-09-08 DIAGNOSIS — E538 Deficiency of other specified B group vitamins: Secondary | ICD-10-CM

## 2016-09-08 DIAGNOSIS — R5383 Other fatigue: Secondary | ICD-10-CM | POA: Diagnosis not present

## 2016-09-08 DIAGNOSIS — E042 Nontoxic multinodular goiter: Secondary | ICD-10-CM | POA: Diagnosis not present

## 2016-09-08 DIAGNOSIS — R635 Abnormal weight gain: Secondary | ICD-10-CM

## 2016-09-08 DIAGNOSIS — Z23 Encounter for immunization: Secondary | ICD-10-CM | POA: Diagnosis not present

## 2016-09-08 DIAGNOSIS — R7301 Impaired fasting glucose: Secondary | ICD-10-CM | POA: Diagnosis not present

## 2016-09-08 DIAGNOSIS — E669 Obesity, unspecified: Secondary | ICD-10-CM

## 2016-09-08 NOTE — Progress Notes (Signed)
   Subjective:    Patient ID: Rhonda Henry, female    DOB: 1945-02-06, 72 y.o.   MRN: [id number]  HPI The patient is a 71 YO female coming in for concerns about lack of weight loss with dieting. She has been exercising more than ever and following a diet from dietician and is not losing weight. She has plateaued at around 180 which is frustrating with her. She does have som fatigue which is slightly better with the exercise. She has not had any changes to medications. There is family history of thyroid problems (cancer in mother). She is also concerned about insulin resistance.   Review of Systems  Constitutional: Positive for activity change and appetite change. Negative for chills, fatigue, fever and unexpected weight change.  HENT: Negative.   Respiratory: Negative.   Cardiovascular: Negative.   Gastrointestinal: Negative.   Musculoskeletal: Negative.   Skin: Negative.   Neurological: Negative.   Psychiatric/Behavioral: Negative.       Objective:   Physical Exam  Constitutional: She is oriented to person, place, and time. She appears well-developed and well-nourished.  HENT:  Head: Normocephalic and atraumatic.  Eyes: EOM are normal.  Neck: Normal range of motion.  Cardiovascular: Normal rate and regular rhythm.   Pulmonary/Chest: Effort normal and breath sounds normal. No respiratory distress. She has no wheezes.  Abdominal: Soft. Bowel sounds are normal. She exhibits no distension. There is no tenderness. There is no rebound.  Musculoskeletal: She exhibits no edema.  Neurological: She is alert and oriented to person, place, and time. Coordination normal.  Skin: Skin is warm and dry.   Vitals:   09/08/16 1104  BP: 134/82  Pulse: 80  Temp: 98.9 F (37.2 C)  TempSrc: Oral  SpO2: 99%  Weight: 181 lb (82.1 kg)  Height: 5' 0.5" (1.537 m)      Assessment & Plan:  Flu shot given at visit.

## 2016-09-08 NOTE — Patient Instructions (Addendum)
We will check the labs today and call you back with the results.    

## 2016-09-09 ENCOUNTER — Other Ambulatory Visit (INDEPENDENT_AMBULATORY_CARE_PROVIDER_SITE_OTHER): Payer: Medicare Other

## 2016-09-09 ENCOUNTER — Encounter: Payer: Self-pay | Admitting: Internal Medicine

## 2016-09-09 DIAGNOSIS — R5383 Other fatigue: Secondary | ICD-10-CM | POA: Diagnosis not present

## 2016-09-09 DIAGNOSIS — R7301 Impaired fasting glucose: Secondary | ICD-10-CM

## 2016-09-09 DIAGNOSIS — E538 Deficiency of other specified B group vitamins: Secondary | ICD-10-CM

## 2016-09-09 DIAGNOSIS — R635 Abnormal weight gain: Secondary | ICD-10-CM | POA: Diagnosis not present

## 2016-09-09 DIAGNOSIS — E789 Disorder of lipoprotein metabolism, unspecified: Secondary | ICD-10-CM

## 2016-09-09 LAB — VITAMIN B12: VITAMIN B 12: 616 pg/mL (ref 211–911)

## 2016-09-09 LAB — CBC
HEMATOCRIT: 39.4 % (ref 36.0–46.0)
Hemoglobin: 13 g/dL (ref 12.0–15.0)
MCHC: 33 g/dL (ref 30.0–36.0)
MCV: 83.2 fl (ref 78.0–100.0)
PLATELETS: 257 10*3/uL (ref 150.0–400.0)
RBC: 4.73 Mil/uL (ref 3.87–5.11)
RDW: 13.6 % (ref 11.5–15.5)
WBC: 6.2 10*3/uL (ref 4.0–10.5)

## 2016-09-09 LAB — COMPREHENSIVE METABOLIC PANEL
ALBUMIN: 3.8 g/dL (ref 3.5–5.2)
ALT: 11 U/L (ref 0–35)
AST: 15 U/L (ref 0–37)
Alkaline Phosphatase: 74 U/L (ref 39–117)
BUN: 19 mg/dL (ref 6–23)
CALCIUM: 9.4 mg/dL (ref 8.4–10.5)
CHLORIDE: 105 meq/L (ref 96–112)
CO2: 27 mEq/L (ref 19–32)
CREATININE: 0.71 mg/dL (ref 0.40–1.20)
GFR: 86.1 mL/min (ref >60.00)
Glucose, Bld: 89 mg/dL (ref 70–99)
Potassium: 3.9 mEq/L (ref 3.5–5.1)
Sodium: 141 mEq/L (ref 135–145)
Total Bilirubin: 0.6 mg/dL (ref 0.2–1.2)
Total Protein: 6.5 g/dL (ref 6.0–8.3)

## 2016-09-09 LAB — LIPID PANEL
CHOL/HDL RATIO: 4
CHOLESTEROL: 176 mg/dL (ref 0–200)
HDL: 41 mg/dL (ref >39.00)
LDL CALC: 114 mg/dL — AB (ref 0–99)
NONHDL: 135.01
Triglycerides: 106 mg/dL (ref 0.0–149.0)
VLDL: 21.2 mg/dL (ref 0.0–40.0)

## 2016-09-09 LAB — T4, FREE: FREE T4: 0.93 ng/dL (ref 0.60–1.60)

## 2016-09-09 LAB — TSH: TSH: 3.91 u[IU]/mL (ref 0.35–4.50)

## 2016-09-09 LAB — HEMOGLOBIN A1C: Hgb A1c MFr Bld: 5.6 % (ref 4.6–6.5)

## 2016-09-09 NOTE — Assessment & Plan Note (Signed)
Checking TSH and free T4 and adjust as needed.  

## 2016-09-09 NOTE — Assessment & Plan Note (Signed)
Checking insulin, HgA1c, thyroid panel, B12. Reviewed labs from GI recently with her which are normal. Discussed with her in detail about dieting and weight loss as well as changing metabolism.

## 2016-09-12 LAB — INSULIN, FREE (BIOACTIVE): Insulin, Free: 3.9 u[IU]/mL (ref 1.5–14.9)

## 2016-09-13 ENCOUNTER — Encounter: Payer: Self-pay | Admitting: Internal Medicine

## 2016-09-13 DIAGNOSIS — E2839 Other primary ovarian failure: Secondary | ICD-10-CM

## 2016-09-13 MED ORDER — CIPROFLOXACIN HCL 500 MG PO TABS: 500.0000 mg | ORAL_TABLET | Freq: Two times a day (BID) | ORAL | 0 refills | Status: DC

## 2016-09-14 ENCOUNTER — Telehealth: Payer: Self-pay | Admitting: Gastroenterology

## 2016-09-14 NOTE — Telephone Encounter (Signed)
Dr Ardis Hughs the pt states she is doing well on linzess but has increasing neuropathy.  She also wants to know if magnesium pill is good for constipation.  She was told by a friend that it helps.  Please advise

## 2016-09-16 NOTE — Telephone Encounter (Signed)
Neuropathy has not been described as a potential side effect (epocrates) and so I think this may just be a coincidence.  Magnesium does help constipation but I prefer she stay on the linzess and other agents we discussed at her ROV.  If neuropathy symptoms significantly worsen more, she should stop the linzess as a trial for 2-3 weeks to see how she responds.

## 2016-09-16 NOTE — Telephone Encounter (Signed)
The pt has been advised and will try stopping the linzess for 2-3 weeks and if neuropathy returns she will call back.

## 2016-09-28 DIAGNOSIS — G894 Chronic pain syndrome: Secondary | ICD-10-CM | POA: Diagnosis not present

## 2016-09-28 DIAGNOSIS — Z79899 Other long term (current) drug therapy: Secondary | ICD-10-CM | POA: Diagnosis not present

## 2016-09-28 DIAGNOSIS — M79641 Pain in right hand: Secondary | ICD-10-CM | POA: Diagnosis not present

## 2016-09-28 DIAGNOSIS — M79642 Pain in left hand: Secondary | ICD-10-CM | POA: Diagnosis not present

## 2016-10-04 ENCOUNTER — Encounter: Payer: Self-pay | Admitting: Internal Medicine

## 2016-10-06 MED ORDER — SULFAMETHOXAZOLE-TRIMETHOPRIM 800-160 MG PO TABS: 1.0000 | ORAL_TABLET | Freq: Two times a day (BID) | ORAL | 0 refills | Status: DC

## 2016-10-18 ENCOUNTER — Ambulatory Visit: Payer: Medicare Other | Admitting: Dietician

## 2016-10-20 ENCOUNTER — Ambulatory Visit (INDEPENDENT_AMBULATORY_CARE_PROVIDER_SITE_OTHER): Payer: Medicare Other | Admitting: Nurse Practitioner

## 2016-10-20 ENCOUNTER — Inpatient Hospital Stay: Admission: RE | Admit: 2016-10-20 | Payer: Medicare Other | Source: Ambulatory Visit

## 2016-10-20 ENCOUNTER — Encounter: Payer: Self-pay | Admitting: Nurse Practitioner

## 2016-10-20 VITALS — BP 130/78 | HR 78 | Temp 98.4°F | Ht 60.5 in | Wt 174.0 lb

## 2016-10-20 DIAGNOSIS — J069 Acute upper respiratory infection, unspecified: Secondary | ICD-10-CM | POA: Diagnosis not present

## 2016-10-20 MED ORDER — IPRATROPIUM BROMIDE 0.03 % NA SOLN: 2.0000 | Freq: Two times a day (BID) | NASAL | 0 refills | Status: DC

## 2016-10-20 MED ORDER — BENZONATATE 100 MG PO CAPS: 100.0000 mg | ORAL_CAPSULE | Freq: Three times a day (TID) | ORAL | 0 refills | Status: DC | PRN

## 2016-10-20 MED ORDER — SALINE SPRAY 0.65 % NA SOLN: 1.0000 | NASAL | 0 refills | Status: DC | PRN

## 2016-10-20 MED ORDER — HYDROCODONE-HOMATROPINE 5-1.5 MG/5ML PO SYRP: 5.0000 mL | ORAL_SOLUTION | Freq: Four times a day (QID) | ORAL | 0 refills | Status: DC | PRN

## 2016-10-20 MED ORDER — GUAIFENESIN-DM 100-10 MG/5ML PO SYRP: 10.0000 mL | ORAL_SOLUTION | Freq: Three times a day (TID) | ORAL | 0 refills | Status: DC | PRN

## 2016-10-20 NOTE — Patient Instructions (Addendum)
Call office for oral antibiotics if no improvement by Monday.  URI Instructions: Encourage adequate oral hydration.  Use over-the-counter  "cold" medicines  such as "Tylenol cold" , "Advil cold",  "Mucinex" or" Mucinex D"  for cough and congestion.  Avoid decongestants if you have high blood pressure. Use" Delsym" or" Robitussin" cough syrup varietis for cough.  You can use plain "Tylenol" or "Advi"l for fever, chills and achyness.   "Common cold" symptoms are usually triggered by a virus.  The antibiotics are usually not necessary. On average, a" viral cold" illness would take 4-10 days to resolve. Please, make an appointment if you are not better or if you're worse.

## 2016-10-20 NOTE — Progress Notes (Signed)
Subjective:  Patient ID: Rhonda Henry, female    DOB: Jun 04, 1945  Age: 71 y.o. MRN: [id number]  CC: Cough (sore throat,coughing,congestion,ears pain--going on for 5 days. )  URI   This is a new problem. The current episode started in the past 7 days. The problem has been unchanged. Associated symptoms include congestion, coughing, headaches, a plugged ear sensation, rhinorrhea, sinus pain, sneezing, a sore throat and swollen glands. Pertinent negatives include no nausea or wheezing. She has tried acetaminophen, decongestant and increased fluids for the symptoms. The treatment provided mild relief.    Outpatient Medications Prior to Visit  Medication Sig Dispense Refill  . Alpha-Lipoic Acid 600 MG CAPS Take 1 capsule by mouth daily.    . diazepam (VALIUM) 5 MG tablet Take 5 mg by mouth at bedtime as needed.     Marland Kitchen esomeprazole (NEXIUM) 20 MG capsule Take 20 mg by mouth daily.     Marland Kitchen linaclotide (LINZESS) 72 MCG capsule Take 1 capsule (72 mcg total) by mouth daily before breakfast. 30 capsule 11  . losartan-hydrochlorothiazide (HYZAAR) 50-12.5 MG tablet Take 1 tablet by mouth daily. 90 tablet 3  . methylcellulose (CITRUCEL) oral powder Take 1 packet by mouth daily.    . nortriptyline (PAMELOR) 10 MG capsule Take 10 mg by mouth at bedtime.  3  . nortriptyline (PAMELOR) 25 MG capsule Take 25 mg by mouth at bedtime.  3  . clindamycin (CLEOCIN) 150 MG capsule Take 150 mg by mouth as needed (Take after sex.).     Marland Kitchen ciprofloxacin (CIPRO) 500 MG tablet Take 1 tablet (500 mg total) by mouth 2 (two) times daily. (Patient not taking: Reported on 10/20/2016) 14 tablet 0  . sulfamethoxazole-trimethoprim (BACTRIM DS,SEPTRA DS) 800-160 MG tablet Take 1 tablet by mouth 2 (two) times daily. (Patient not taking: Reported on 10/20/2016) 10 tablet 0   No facility-administered medications prior to visit.     ROS See HPI  Objective:  BP 130/78   Pulse 78   Temp 98.4 F (36.9 C)   Ht 5' 0.5" (1.537 m)    Wt 174 lb (78.9 kg)   SpO2 98%   BMI 33.42 kg/m   BP Readings from Last 3 Encounters:  10/20/16 130/78  09/08/16 134/82  08/17/16 124/80    Wt Readings from Last 3 Encounters:  10/20/16 174 lb (78.9 kg)  09/08/16 181 lb (82.1 kg)  08/17/16 181 lb 6 oz (82.3 kg)    Physical Exam  Constitutional: She is oriented to person, place, and time.  HENT:  Right Ear: Tympanic membrane, external ear and ear canal normal.  Left Ear: Tympanic membrane, external ear and ear canal normal.  Nose: Mucosal edema and rhinorrhea present. Right sinus exhibits maxillary sinus tenderness. Right sinus exhibits no frontal sinus tenderness. Left sinus exhibits maxillary sinus tenderness. Left sinus exhibits no frontal sinus tenderness.  Mouth/Throat: Uvula is midline. No trismus in the jaw. Posterior oropharyngeal erythema present. No oropharyngeal exudate.  Eyes: No scleral icterus.  Neck: Normal range of motion. Neck supple.  Cardiovascular: Normal rate and normal heart sounds.   Pulmonary/Chest: Effort normal and breath sounds normal. No respiratory distress.  Musculoskeletal: She exhibits no edema.  Lymphadenopathy:    She has cervical adenopathy.  Neurological: She is alert and oriented to person, place, and time.  Vitals reviewed.   Lab Results  Component Value Date   WBC 6.2 09/09/2016   HGB 13.0 09/09/2016   HCT 39.4 09/09/2016   PLT 257.0 09/09/2016  GLUCOSE 89 09/09/2016   CHOL 176 09/09/2016   TRIG 106.0 09/09/2016   HDL 41.00 09/09/2016   LDLCALC 114 (H) 09/09/2016   ALT 11 09/09/2016   AST 15 09/09/2016   NA 141 09/09/2016   K 3.9 09/09/2016   CL 105 09/09/2016   CREATININE 0.71 09/09/2016   BUN 19 09/09/2016   CO2 27 09/09/2016   TSH 3.91 09/09/2016   HGBA1C 5.6 09/09/2016    Dg Chest 2 View  Result Date: 05/05/2016 CLINICAL DATA:  Chronic cough.  Hypertension EXAM: CHEST  2 VIEW COMPARISON:  Chest CT, 07/18/2015.  Chest radiograph, 02/20/2012. FINDINGS: The cardiac  silhouette is normal in size and configuration. No mediastinal or hilar masses. No evidence of adenopathy. Lungs are hyperexpanded. There is mild linear scarring in the bases. A calcification projects over the right lateral lung base, which is a dystrophic breast calcification based on the prior CT. Lungs are otherwise clear. No pleural effusion or pneumothorax. Skeletal structures are intact. IMPRESSION: No active cardiopulmonary disease. Electronically Signed   By: Lajean Manes M.D.   On: 05/05/2016 12:29    Assessment & Plan:   Daphna was seen today for cough.  Diagnoses and all orders for this visit:  Acute URI -     ipratropium (ATROVENT) 0.03 % nasal spray; Place 2 sprays into both nostrils 2 (two) times daily. Do not use for more than 5days. -     guaiFENesin-dextromethorphan (ROBITUSSIN DM) 100-10 MG/5ML syrup; Take 10 mLs by mouth every 8 (eight) hours as needed for cough. -     sodium chloride (OCEAN) 0.65 % SOLN nasal spray; Place 1 spray into both nostrils as needed for congestion. -     Discontinue: HYDROcodone-homatropine (HYCODAN) 5-1.5 MG/5ML syrup; Take 5 mLs by mouth every 6 (six) hours as needed for cough. -     benzonatate (TESSALON) 100 MG capsule; Take 1 capsule (100 mg total) by mouth 3 (three) times daily as needed for cough.   I have discontinued Ms. Troiano's ciprofloxacin, sulfamethoxazole-trimethoprim, and HYDROcodone-homatropine. I am also having her start on ipratropium, guaiFENesin-dextromethorphan, sodium chloride, and benzonatate. Additionally, I am having her maintain her clindamycin, esomeprazole, nortriptyline, nortriptyline, diazepam, losartan-hydrochlorothiazide, Alpha-Lipoic Acid, linaclotide, and methylcellulose.  Meds ordered this encounter  Medications  . ipratropium (ATROVENT) 0.03 % nasal spray    Sig: Place 2 sprays into both nostrils 2 (two) times daily. Do not use for more than 5days.    Dispense:  30 mL    Refill:  0    Order Specific  Question:   Supervising Provider    Answer:   Binnie Rail [[id number]]  . guaiFENesin-dextromethorphan (ROBITUSSIN DM) 100-10 MG/5ML syrup    Sig: Take 10 mLs by mouth every 8 (eight) hours as needed for cough.    Dispense:  118 mL    Refill:  0    Order Specific Question:   Supervising Provider    Answer:   Binnie Rail [[id number]]  . sodium chloride (OCEAN) 0.65 % SOLN nasal spray    Sig: Place 1 spray into both nostrils as needed for congestion.    Dispense:  15 mL    Refill:  0    Order Specific Question:   Supervising Provider    Answer:   Binnie Rail [[id number]]  . DISCONTD: HYDROcodone-homatropine (HYCODAN) 5-1.5 MG/5ML syrup    Sig: Take 5 mLs by mouth every 6 (six) hours as needed for cough.    Dispense:  100 mL  Refill:  0    Order Specific Question:   Supervising Provider    Answer:   Binnie Rail [id number]  . benzonatate (TESSALON) 100 MG capsule    Sig: Take 1 capsule (100 mg total) by mouth 3 (three) times daily as needed for cough.    Dispense:  20 capsule    Refill:  0    Order Specific Question:   Supervising Provider    Answer:   Binnie Rail [[id number]]    Follow-up: No Follow-up on file.  Wilfred Lacy, NP

## 2016-10-25 ENCOUNTER — Telehealth: Payer: Self-pay | Admitting: Nurse Practitioner

## 2016-10-25 DIAGNOSIS — J209 Acute bronchitis, unspecified: Secondary | ICD-10-CM

## 2016-10-25 MED ORDER — DOXYCYCLINE HYCLATE 100 MG PO TABS: 100.0000 mg | ORAL_TABLET | Freq: Two times a day (BID) | ORAL | 0 refills | Status: AC

## 2016-10-25 MED ORDER — HYDROCODONE-HOMATROPINE 5-1.5 MG/5ML PO SYRP: 5.0000 mL | ORAL_SOLUTION | Freq: Three times a day (TID) | ORAL | 0 refills | Status: DC | PRN

## 2016-10-25 NOTE — Telephone Encounter (Signed)
Patient states she still is not feeling well. She has only used OTC cough medication because insurance would not pay for the other she called in. She would like the cough medication with codeine in it called in. They will cover it. She states she has a lot of mucus coming up. She would like maybe a z pack called in.

## 2016-10-25 NOTE — Telephone Encounter (Signed)
Pt is aware to pick up rx for cough med. Place it up front.   abx sent to [facility].

## 2016-11-10 DIAGNOSIS — Z0110 Encounter for examination of ears and hearing without abnormal findings: Secondary | ICD-10-CM | POA: Diagnosis not present

## 2016-11-10 DIAGNOSIS — D2371 Other benign neoplasm of skin of right lower limb, including hip: Secondary | ICD-10-CM | POA: Diagnosis not present

## 2016-11-10 DIAGNOSIS — K219 Gastro-esophageal reflux disease without esophagitis: Secondary | ICD-10-CM | POA: Diagnosis not present

## 2016-11-10 DIAGNOSIS — D225 Melanocytic nevi of trunk: Secondary | ICD-10-CM | POA: Diagnosis not present

## 2016-11-10 DIAGNOSIS — H9202 Otalgia, left ear: Secondary | ICD-10-CM | POA: Diagnosis not present

## 2016-11-10 DIAGNOSIS — L820 Inflamed seborrheic keratosis: Secondary | ICD-10-CM | POA: Diagnosis not present

## 2016-11-10 DIAGNOSIS — L700 Acne vulgaris: Secondary | ICD-10-CM | POA: Diagnosis not present

## 2016-11-10 DIAGNOSIS — Z86018 Personal history of other benign neoplasm: Secondary | ICD-10-CM | POA: Diagnosis not present

## 2016-11-10 DIAGNOSIS — Z23 Encounter for immunization: Secondary | ICD-10-CM | POA: Diagnosis not present

## 2016-11-18 DIAGNOSIS — D485 Neoplasm of uncertain behavior of skin: Secondary | ICD-10-CM | POA: Diagnosis not present

## 2016-11-29 ENCOUNTER — Other Ambulatory Visit: Payer: Self-pay | Admitting: Internal Medicine

## 2016-11-29 DIAGNOSIS — Z1231 Encounter for screening mammogram for malignant neoplasm of breast: Secondary | ICD-10-CM

## 2016-12-03 ENCOUNTER — Ambulatory Visit
Admission: RE | Admit: 2016-12-03 | Discharge: 2016-12-03 | Disposition: A | Discharge status: Home / Self Care | Payer: Medicare Other | Source: Ambulatory Visit | Attending: Internal Medicine | Admitting: Internal Medicine

## 2016-12-03 DIAGNOSIS — Z1231 Encounter for screening mammogram for malignant neoplasm of breast: Secondary | ICD-10-CM | POA: Diagnosis not present

## 2016-12-22 DIAGNOSIS — G894 Chronic pain syndrome: Secondary | ICD-10-CM | POA: Diagnosis not present

## 2016-12-22 DIAGNOSIS — Z79899 Other long term (current) drug therapy: Secondary | ICD-10-CM | POA: Diagnosis not present

## 2016-12-22 DIAGNOSIS — M79642 Pain in left hand: Secondary | ICD-10-CM | POA: Diagnosis not present

## 2016-12-22 DIAGNOSIS — M79641 Pain in right hand: Secondary | ICD-10-CM | POA: Diagnosis not present

## 2017-01-27 DIAGNOSIS — H6982 Other specified disorders of Eustachian tube, left ear: Secondary | ICD-10-CM | POA: Diagnosis not present

## 2017-01-27 DIAGNOSIS — J320 Chronic maxillary sinusitis: Secondary | ICD-10-CM | POA: Diagnosis not present

## 2017-02-08 ENCOUNTER — Telehealth: Payer: Self-pay | Admitting: Gastroenterology

## 2017-02-08 DIAGNOSIS — I10 Essential (primary) hypertension: Secondary | ICD-10-CM | POA: Diagnosis not present

## 2017-02-08 DIAGNOSIS — R197 Diarrhea, unspecified: Secondary | ICD-10-CM | POA: Diagnosis not present

## 2017-02-08 NOTE — Telephone Encounter (Signed)
Left message for patient to call back  

## 2017-02-08 NOTE — Telephone Encounter (Signed)
Patient states she has been having terrible diarrhea for the last 2 days and would like to know if Dr.Jacob's could prescribe her something or give some advice. Pt states she in currently in [place] and won't be home till Saturday. Also pt states she has neuropathy and needs a medication that will not affect that.

## 2017-02-08 NOTE — Telephone Encounter (Signed)
Patient reports that she is having diarrhea every 20 minutes for the last 24 hours.  She has tried imodium and is still having diarrhea despite multiple doses.  She is in [facility] and had sushi and crab 2 days ago.  She has a diarrhea episode after drinking anything.  She is advised she should be evaluated in an urgent care or ED as she may need fluids.  She also reports she has been there for a few weeks and was placed on antibiotics a few weeks ago right after she arrived.  I again reiterated she needs to be seen in an Urgent Care or ED for evaluation. She would like to have an antibiotic called in.  I advised that we do not know what we are treating and she should seek care there in [facility].

## 2017-02-23 ENCOUNTER — Ambulatory Visit (INDEPENDENT_AMBULATORY_CARE_PROVIDER_SITE_OTHER): Payer: Medicare Other | Admitting: Physician Assistant

## 2017-02-23 ENCOUNTER — Encounter: Payer: Self-pay | Admitting: Physician Assistant

## 2017-02-23 VITALS — BP 130/78 | HR 78 | Ht 61.0 in | Wt 172.0 lb

## 2017-02-23 DIAGNOSIS — R1031 Right lower quadrant pain: Secondary | ICD-10-CM | POA: Diagnosis not present

## 2017-02-23 DIAGNOSIS — R197 Diarrhea, unspecified: Secondary | ICD-10-CM | POA: Diagnosis not present

## 2017-02-23 MED ORDER — DICYCLOMINE HCL 10 MG PO CAPS: 10.0000 mg | ORAL_CAPSULE | ORAL | 1 refills | Status: DC | PRN

## 2017-02-23 NOTE — Progress Notes (Signed)
I agree with the above note, plan 

## 2017-02-23 NOTE — Patient Instructions (Signed)
We have sent the following medications to your pharmacy for you to pick up at your convenience:  Dicyclomine 10 mg every 4-6 hours as needed can use 20-30 miuntes before meals to prevent pain

## 2017-02-23 NOTE — Progress Notes (Signed)
Chief Complaint: Right lower quadrant pain  HPI:    Rhonda Henry is a 72 year old female, who follows with Dr. Ardis Hughs, with a past medical history as listed below who presents to clinic for a complaint of right lower quadrant pain.      Last colonoscopy 12/01/15 by Dr. Ardis Hughs with diverticulosis in the left colon, external and internal hemorrhoids and otherwise normal.    Today, presents to clinic accompanied by her husband who does assist with her history.  On 02/07/17 while out of town after eating sushi the night before she started with violent watery diarrhea, nausea and abdominal pain mostly on the right side.  She called our office requesting antibiotics but was told to go to the urgent care.  In the urgent care she saw a "very young doctor", who told her that she may have C. difficile.  Describes being on Amoxicillin about 2 weeks prior to onset.  He did prescribe her an antibiotic which she never picked up because she was not sure of the diagnosis and she started feeling better about 48 hours later.  The next day whenever she would eat she would have loose stools but made it home okay on the plane on 9th of February.  Since then more back to normal with her regular constipation.  She did go out to eat a very rich dinner on Saturday night and again had another episode of nausea chills and diarrhea which resolved after that episode.  This week she has remained constipated and has actually started using her Linzess again in order bowel movement.  She started probiotics about a week ago empirically.  She continues with a dull ache on the right side of her abdomen which seems to radiate through to her back as well as an increased amount of gas.    Patient describes she never had trouble with her GI system until about a year ago when he went to [place] on vacation and she started with constipation which she treats with Linzess.  She wonders why she always gets sick when they go out of town.    Denies  weight loss, anorexia, vomiting or symptoms that awaken her at night.  Past Medical History:  Diagnosis Date  . Abdominal pain, acute, right upper quadrant    had gall bladder removed and now no issues   . Allergy   . Anxiety   . Arthritis   . Bloating   . Complication of anesthesia    "I shake like crazy"  . Constipation    not now  . Depression    no issues currently   . Difficulty sleeping    no issues now  . GERD (gastroesophageal reflux disease)   . Heart murmur    no recent mention of this per pt.   . Hypertension   . Microscopic hematuria     x 30 yrs   . Nausea   . Nerve damage    face -1 yr ago and now almost resolved   . Neuromuscular disorder ([place])   . Recurrent UTI     Past Surgical History:  Procedure Laterality Date  . breast biopsies    . BREAST LUMPECTOMY     x 2  . c sections     x2  . CHOLECYSTECTOMY N/A 03/13/2013   Procedure: LAPAROSCOPIC CHOLECYSTECTOMY WITH INTRAOPERATIVE CHOLANGIOGRAM;  Surgeon: [name]. [name], MD;  Location: [facility];  Service: General;  Laterality: N/A;  . COLONOSCOPY    .  CYST EXCISION     removed from back and left arm  . FACIAL COSMETIC SURGERY  7-10   compl by thick scars and a scar necrosis on R cheek  . LUMBAR EPIDURAL INJECTION  Jan 15   . [place]  . 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 Alpha-Lipoic Acid 600 MG CAPS Take 1 capsule by mouth daily.    . benzonatate (TESSALON) 100 MG capsule Take 1 capsule (100 mg total) by mouth 3 (three) times daily as needed for cough. 20 capsule 0  . clindamycin (CLEOCIN) 150 MG capsule Take 150 mg by mouth as needed (Take after sex.).     Marland Kitchen diazepam (VALIUM) 5 MG tablet Take 5 mg by mouth at bedtime as needed.     Marland Kitchen esomeprazole (NEXIUM) 20 MG  capsule Take 20 mg by mouth daily.     Marland Kitchen HYDROcodone-homatropine (HYCODAN) 5-1.5 MG/5ML syrup Take 5 mLs by mouth every 8 (eight) hours as needed for cough. 60 mL 0  . ipratropium (ATROVENT) 0.03 % nasal spray Place 2 sprays into both nostrils 2 (two) times daily. Do not use for more than 5days. 30 mL 0  . linaclotide (LINZESS) 72 MCG capsule Take 1 capsule (72 mcg total) by mouth daily before breakfast. 30 capsule 11  . losartan-hydrochlorothiazide (HYZAAR) 50-12.5 MG tablet Take 1 tablet by mouth daily. 90 tablet 3  . methylcellulose (CITRUCEL) oral powder Take 1 packet by mouth daily.    . nortriptyline (PAMELOR) 10 MG capsule Take 10 mg by mouth at bedtime.  3  . nortriptyline (PAMELOR) 25 MG capsule Take 25 mg by mouth at bedtime.  3  . sodium chloride (OCEAN) 0.65 % SOLN nasal spray Place 1 spray into both nostrils as needed for congestion. 15 mL 0   No current facility-administered medications for this visit.     Allergies as of 02/23/2017 - Review Complete 10/20/2016  Allergen Reaction Noted  . Ciprofloxacin Other (See Comments) 02/23/2017  . Other Other (See Comments) and Hives 05/10/2013  . Desipramine hcl Rash   . Penicillins Hives and Rash   . Tape Hives 03/30/2013    Family History  Problem Relation Age of Onset  . Thyroid cancer Mother   . Heart disease Mother   . Macular degeneration Mother   . Stroke Mother   . Hypertension Mother   . Glaucoma Father   . Hypertension Other   . Stomach cancer Paternal Grandmother   . Colon cancer Neg Hx   . Colon polyps Neg Hx   . Rectal cancer Neg Hx   . Pancreatic cancer Neg Hx     Social History   Socioeconomic History  . Marital status: Married    Spouse name: Herbie Baltimore  . Number of children: 2  . Years of education: 21  . Highest education level: Not on file  Social Needs  . Financial resource strain: Not on file  . Food insecurity - worry: Not on file  . Food insecurity - inability: Not on file  . Transportation  needs - medical: Not on file  . Transportation needs - non-medical: Not on file  Occupational History  . Occupation: Buyer, retail: [employer]    Comment: owns own business  Tobacco Use  .  Smoking status: Never Smoker  . Smokeless tobacco: Never Used  Substance and Sexual Activity  . Alcohol use: Yes    Alcohol/week: 0.0 oz    Comment: socially 1-2 glasses weekly  . Drug use: No  . Sexual activity: Yes    Birth control/protection: Other-see comments  Other Topics Concern  . Not on file  Social History Narrative   Lives with husband in a 2 story home.  Has 2 grown children.  Owns her own business.  Education: Forensic psychologist.     Review of Systems:    Constitutional: No weight loss Cardiovascular: No chest pain   Respiratory: No SOB  Gastrointestinal: See HPI and otherwise negative   Physical Exam:  Vital signs: BP 130/78   Pulse 78   Ht 5\' 1"  (1.549 m)   Wt 172 lb (78 kg)   BMI 32.50 kg/m   Constitutional:   Pleasant Caucasian female appears to be in NAD, Well developed, Well nourished, alert and cooperative Respiratory: Respirations even and unlabored. Lungs clear to auscultation bilaterally.   No wheezes, crackles, or rhonchi.  Cardiovascular: Normal S1, S2. No MRG. Regular rate and rhythm. No peripheral edema, cyanosis or pallor.  Gastrointestinal:  Soft, nondistended, Mild RLQ ttp. No rebound or guarding. Normal bowel sounds. No appreciable masses or hepatomegaly. Psychiatric: Demonstrates good judgement and reason without abnormal affect or behaviors.  No recent labs or imaging.  Assessment: 1.  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  Acute symptoms seem to have resolved and patient is left with seems like postinfectious IBS. 2.  Advised patient to continue her Align probiotic for at least 2 months. 3.  Prescribed Dicyclomine 10 mg to be used every 4-6 hours as needed for right-sided abdominal pain and/or the patient may preemptively use this medicine about 20-30 minutes before a meal in order to prevent symptoms. 4.  Patient to follow in clinic with me in 3-4 weeks if needed.  At that time, if she continues with discomfort would recommend a CT abdomen and pelvis for further evaluation.  Ellouise Newer, PA-C [facility] 02/23/2017, 9:34 AM  Cc: Hoyt Koch, *

## 2017-03-11 ENCOUNTER — Other Ambulatory Visit: Payer: Self-pay | Admitting: Emergency Medicine

## 2017-03-11 MED ORDER — DICYCLOMINE HCL 10 MG PO CAPS: 10.0000 mg | ORAL_CAPSULE | ORAL | 1 refills | Status: DC | PRN

## 2017-03-11 NOTE — Telephone Encounter (Signed)
Patient request refill for Dicyclomine. Rx sent to pharmacy.

## 2017-03-15 ENCOUNTER — Ambulatory Visit: Payer: Medicare Other | Admitting: Physician Assistant

## 2017-03-21 DIAGNOSIS — M25561 Pain in right knee: Secondary | ICD-10-CM | POA: Diagnosis not present

## 2017-03-21 DIAGNOSIS — M1711 Unilateral primary osteoarthritis, right knee: Secondary | ICD-10-CM | POA: Diagnosis not present

## 2017-03-21 DIAGNOSIS — Z96652 Presence of left artificial knee joint: Secondary | ICD-10-CM | POA: Diagnosis not present

## 2017-03-30 DIAGNOSIS — M79641 Pain in right hand: Secondary | ICD-10-CM | POA: Diagnosis not present

## 2017-03-30 DIAGNOSIS — G894 Chronic pain syndrome: Secondary | ICD-10-CM | POA: Diagnosis not present

## 2017-03-30 DIAGNOSIS — M79642 Pain in left hand: Secondary | ICD-10-CM | POA: Diagnosis not present

## 2017-03-30 DIAGNOSIS — Z79899 Other long term (current) drug therapy: Secondary | ICD-10-CM | POA: Diagnosis not present

## 2017-04-06 ENCOUNTER — Other Ambulatory Visit: Payer: Self-pay | Admitting: Orthopedic Surgery

## 2017-04-06 DIAGNOSIS — Z96652 Presence of left artificial knee joint: Secondary | ICD-10-CM

## 2017-04-09 ENCOUNTER — Other Ambulatory Visit: Payer: Self-pay | Admitting: Internal Medicine

## 2017-04-15 ENCOUNTER — Ambulatory Visit
Admission: RE | Admit: 2017-04-15 | Discharge: 2017-04-15 | Disposition: A | Discharge status: Home / Self Care | Payer: Medicare Other | Source: Ambulatory Visit | Attending: Orthopedic Surgery | Admitting: Orthopedic Surgery

## 2017-04-15 DIAGNOSIS — Z96652 Presence of left artificial knee joint: Secondary | ICD-10-CM

## 2017-04-15 DIAGNOSIS — M25462 Effusion, left knee: Secondary | ICD-10-CM | POA: Diagnosis not present

## 2017-04-18 ENCOUNTER — Ambulatory Visit: Payer: Self-pay

## 2017-04-18 NOTE — Telephone Encounter (Signed)
Pt c/o of intermittent lightheadedness 1 month ago. Pt states it lasts for a few seconds and then subsides. Starting two weeks ago, she stated that after her episodes of dizziness, her left hand and left foot get "tingly and cold" then goes back to normal sensation. Pt stated that she has a h/o neuropathy. Denies chest pain, numbness or weakness on either side of face, head,arm or legs. Resting HR 88. Care advice given. Appt made for tomorrow with PCP.  Reason for Disposition . [1] MILD dizziness (e.g., walking normally) AND [2] has NOT been evaluated by physician for this  (Exception: dizziness caused by heat exposure, sudden standing, or poor fluid intake)  Answer Assessment - Initial Assessment Questions 1. DESCRIPTION: "Describe your dizziness." Only when standing last few seconds 2. LIGHTHEADED: "Do you feel lightheaded?" (e.g., somewhat faint, woozy, weak upon standing)     Yes  3. VERTIGO: "Do you feel like either you or the room is spinning or tilting?" (i.e. vertigo)     no 4. SEVERITY: "How bad is it?"  "Do you feel like you are going to faint?" "Can you stand and walk?"   - MILD - walking normally   - MODERATE - interferes with normal activities (e.g., work, school)    - SEVERE - unable to stand, requires support to walk, feels like passing out now.      mild 5. ONSET:  "When did the dizziness begin?" 3-4 weeks ago 6. AGGRAVATING FACTORS: "Does anything make it worse?" (e.g., standing, change in head position)     standing 7. HEART RATE: "Can you tell me your heart rate?" "How many beats in 15 seconds?"  (Note: not all patients can do this)       88 8. CAUSE: "What do you think is causing the dizziness?"     I dont know 9. RECURRENT SYMPTOM: "Have you had dizziness before?" If so, ask: "When was the last time?" "What happened that time?"    No 10. OTHER SYMPTOMS: "Do you have any other symptoms?" (e.g., fever, chest pain, vomiting, diarrhea, bleeding) Tingling and cold after pt  has dizziness to the left hand and left foot 11. PREGNANCY: "Is there any chance you are pregnant?" "When was your last menstrual period?"       n/a  Protocols used: DIZZINESS [facility]

## 2017-04-19 ENCOUNTER — Ambulatory Visit (INDEPENDENT_AMBULATORY_CARE_PROVIDER_SITE_OTHER): Payer: Medicare Other | Admitting: Internal Medicine

## 2017-04-19 ENCOUNTER — Encounter: Payer: Self-pay | Admitting: Internal Medicine

## 2017-04-19 DIAGNOSIS — I10 Essential (primary) hypertension: Secondary | ICD-10-CM | POA: Diagnosis not present

## 2017-04-19 MED ORDER — LOSARTAN POTASSIUM 50 MG PO TABS: 50.0000 mg | ORAL_TABLET | Freq: Every day | ORAL | 3 refills | Status: DC

## 2017-04-19 NOTE — Patient Instructions (Signed)
We have sent in just losartan blood pressure medicine to change. I think the losartan/hctz is too much medicine for you now.

## 2017-04-19 NOTE — Progress Notes (Signed)
   Subjective:    Patient ID: Rhonda Henry, female    DOB: August 09, 1945, 72 y.o.   MRN: [id number]  HPI The patient is a 72 YO female coming in for dizziness. She has been losing weight intentionally and is down another 10 pounds since our last visit. She has been getting dizzy in the past month or so. Denies vertigo or spinning room sensation. Feels more lightheaded. Usually happens when standing up. Denies fevers or chills. Denies sinus symptoms. Is still taking losartan/hctz for blood pressure and no change in dosing. Has had some change in her neuropathy symptoms and when she is dizzy gets some cold sensation in the hand and foot left side. Overall symptoms are worsening over the last month. Denies known trigger. Has not tried anything for it. Tries to drink plenty of water. Denies diarrhea or constipation.   Review of Systems  Constitutional: Negative.   HENT: Negative.   Respiratory: Negative for cough, chest tightness and shortness of breath.   Cardiovascular: Negative for chest pain, palpitations and leg swelling.  Gastrointestinal: Negative for abdominal distention, abdominal pain, constipation, diarrhea, nausea and vomiting.  Musculoskeletal: Negative.   Skin: Negative.   Neurological: Positive for dizziness and light-headedness.      Objective:   Physical Exam  Constitutional: She is oriented to person, place, and time. She appears well-developed and well-nourished.  HENT:  Head: Normocephalic and atraumatic.  Eyes: EOM are normal.  Neck: Normal range of motion.  Cardiovascular: Normal rate and regular rhythm.  Pulmonary/Chest: Effort normal and breath sounds normal. No respiratory distress. She has no wheezes. She has no rales.  Abdominal: Soft.  Musculoskeletal: She exhibits no edema.  Neurological: She is alert and oriented to person, place, and time. Coordination normal.  Skin: Skin is warm and dry.   Vitals:   04/19/17 1315  BP: 110/70  Pulse: 75  Temp: 97.6 F  (36.4 C)  TempSrc: Oral  SpO2: 99%  Weight: 172 lb (78 kg)  Height: 5\' 1"  (1.549 m)      Assessment & Plan:

## 2017-04-19 NOTE — Assessment & Plan Note (Signed)
Suspect blood pressure control overly tight given weight loss needs reduction in therapy. Rx for losartan 50 mg to replace losartan/hctz.

## 2017-04-20 DIAGNOSIS — M2142 Flat foot [pes planus] (acquired), left foot: Secondary | ICD-10-CM | POA: Diagnosis not present

## 2017-04-20 DIAGNOSIS — M25562 Pain in left knee: Secondary | ICD-10-CM | POA: Diagnosis not present

## 2017-05-02 DIAGNOSIS — M7542 Impingement syndrome of left shoulder: Secondary | ICD-10-CM | POA: Diagnosis not present

## 2017-05-02 DIAGNOSIS — M25512 Pain in left shoulder: Secondary | ICD-10-CM | POA: Diagnosis not present

## 2017-05-06 DIAGNOSIS — M25562 Pain in left knee: Secondary | ICD-10-CM | POA: Diagnosis not present

## 2017-05-11 DIAGNOSIS — Z124 Encounter for screening for malignant neoplasm of cervix: Secondary | ICD-10-CM | POA: Diagnosis not present

## 2017-05-26 ENCOUNTER — Encounter: Payer: Self-pay | Admitting: Internal Medicine

## 2017-06-06 ENCOUNTER — Telehealth: Payer: Self-pay | Admitting: Emergency Medicine

## 2017-06-06 NOTE — Telephone Encounter (Signed)
Called patient to schedule AWV. Patient declined at this time. 

## 2017-07-13 ENCOUNTER — Emergency Department (HOSPITAL_COMMUNITY): Payer: Medicare Other

## 2017-07-13 ENCOUNTER — Other Ambulatory Visit: Payer: Self-pay

## 2017-07-13 ENCOUNTER — Ambulatory Visit: Payer: Self-pay | Admitting: Internal Medicine

## 2017-07-13 ENCOUNTER — Observation Stay (HOSPITAL_COMMUNITY)
Admission: EM | Admit: 2017-07-13 | Discharge: 2017-07-14 | Disposition: A | Discharge status: Home / Self Care | Payer: Medicare Other | Attending: Family Medicine | Admitting: Family Medicine

## 2017-07-13 ENCOUNTER — Encounter: Payer: Self-pay | Admitting: Internal Medicine

## 2017-07-13 ENCOUNTER — Encounter (HOSPITAL_COMMUNITY): Payer: Self-pay | Admitting: *Deleted

## 2017-07-13 DIAGNOSIS — Z96652 Presence of left artificial knee joint: Secondary | ICD-10-CM | POA: Insufficient documentation

## 2017-07-13 DIAGNOSIS — Z87440 Personal history of urinary (tract) infections: Secondary | ICD-10-CM | POA: Diagnosis not present

## 2017-07-13 DIAGNOSIS — Z823 Family history of stroke: Secondary | ICD-10-CM | POA: Insufficient documentation

## 2017-07-13 DIAGNOSIS — I10 Essential (primary) hypertension: Secondary | ICD-10-CM | POA: Diagnosis not present

## 2017-07-13 DIAGNOSIS — Z888 Allergy status to other drugs, medicaments and biological substances status: Secondary | ICD-10-CM | POA: Insufficient documentation

## 2017-07-13 DIAGNOSIS — Z9889 Other specified postprocedural states: Secondary | ICD-10-CM | POA: Insufficient documentation

## 2017-07-13 DIAGNOSIS — R0789 Other chest pain: Secondary | ICD-10-CM | POA: Diagnosis not present

## 2017-07-13 DIAGNOSIS — R0902 Hypoxemia: Secondary | ICD-10-CM | POA: Diagnosis not present

## 2017-07-13 DIAGNOSIS — Z6832 Body mass index (BMI) 32.0-32.9, adult: Secondary | ICD-10-CM | POA: Diagnosis not present

## 2017-07-13 DIAGNOSIS — Z79899 Other long term (current) drug therapy: Secondary | ICD-10-CM | POA: Insufficient documentation

## 2017-07-13 DIAGNOSIS — Z880 Allergy status to penicillin: Secondary | ICD-10-CM | POA: Insufficient documentation

## 2017-07-13 DIAGNOSIS — Z8249 Family history of ischemic heart disease and other diseases of the circulatory system: Secondary | ICD-10-CM | POA: Diagnosis not present

## 2017-07-13 DIAGNOSIS — Z881 Allergy status to other antibiotic agents status: Secondary | ICD-10-CM | POA: Diagnosis not present

## 2017-07-13 DIAGNOSIS — I700 Atherosclerosis of aorta: Secondary | ICD-10-CM | POA: Insufficient documentation

## 2017-07-13 DIAGNOSIS — R079 Chest pain, unspecified: Secondary | ICD-10-CM

## 2017-07-13 DIAGNOSIS — R42 Dizziness and giddiness: Secondary | ICD-10-CM | POA: Diagnosis not present

## 2017-07-13 DIAGNOSIS — Z9049 Acquired absence of other specified parts of digestive tract: Secondary | ICD-10-CM | POA: Diagnosis not present

## 2017-07-13 DIAGNOSIS — M1990 Unspecified osteoarthritis, unspecified site: Secondary | ICD-10-CM | POA: Diagnosis not present

## 2017-07-13 DIAGNOSIS — K219 Gastro-esophageal reflux disease without esophagitis: Secondary | ICD-10-CM | POA: Diagnosis not present

## 2017-07-13 DIAGNOSIS — E785 Hyperlipidemia, unspecified: Secondary | ICD-10-CM | POA: Diagnosis not present

## 2017-07-13 DIAGNOSIS — R0602 Shortness of breath: Secondary | ICD-10-CM | POA: Diagnosis not present

## 2017-07-13 DIAGNOSIS — E669 Obesity, unspecified: Secondary | ICD-10-CM | POA: Diagnosis not present

## 2017-07-13 DIAGNOSIS — R9431 Abnormal electrocardiogram [ECG] [EKG]: Secondary | ICD-10-CM | POA: Diagnosis not present

## 2017-07-13 LAB — CBC
HCT: 40.3 % (ref 36.0–46.0)
HEMOGLOBIN: 12.5 g/dL (ref 12.0–15.0)
MCH: 26.7 pg (ref 26.0–34.0)
MCHC: 31 g/dL (ref 30.0–36.0)
MCV: 85.9 fL (ref 78.0–100.0)
Platelets: 285 10*3/uL (ref 150–400)
RBC: 4.69 MIL/uL (ref 3.87–5.11)
RDW: 13.3 % (ref 11.5–15.5)
WBC: 7.2 10*3/uL (ref 4.0–10.5)

## 2017-07-13 LAB — BASIC METABOLIC PANEL
ANION GAP: 9 (ref 5–15)
BUN: 13 mg/dL (ref 8–23)
CO2: 25 mmol/L (ref 22–32)
Calcium: 9.4 mg/dL (ref 8.9–10.3)
Chloride: 103 mmol/L (ref 98–111)
Creatinine, Ser: 1.09 mg/dL — ABNORMAL HIGH (ref 0.44–1.00)
GFR, EST AFRICAN AMERICAN: 57 mL/min — AB (ref >60)
GFR, EST NON AFRICAN AMERICAN: 49 mL/min — AB (ref >60)
Glucose, Bld: 123 mg/dL — ABNORMAL HIGH (ref 70–99)
POTASSIUM: 4.3 mmol/L (ref 3.5–5.1)
SODIUM: 137 mmol/L (ref 135–145)

## 2017-07-13 LAB — I-STAT TROPONIN, ED: TROPONIN I, POC: 0 ng/mL (ref 0.00–0.08)

## 2017-07-13 LAB — TROPONIN I: Troponin I: <0.03 ng/mL (ref <0.03)

## 2017-07-13 MED ORDER — ONDANSETRON HCL 4 MG/2ML IJ SOLN: 4.0000 mg | Freq: Four times a day (QID) | INTRAMUSCULAR | Status: DC | PRN

## 2017-07-13 MED ORDER — ENOXAPARIN SODIUM 40 MG/0.4ML ~~LOC~~ SOLN
40.0000 mg | SUBCUTANEOUS | Status: DC
  Administered 2017-07-14: 40 mg via SUBCUTANEOUS
  Filled 2017-07-13: qty 0.4

## 2017-07-13 MED ORDER — NORTRIPTYLINE HCL 25 MG PO CAPS: 25.0000 mg | ORAL_CAPSULE | Freq: Every day | ORAL | Status: DC

## 2017-07-13 MED ORDER — ACETAMINOPHEN 325 MG PO TABS: 650.0000 mg | ORAL_TABLET | ORAL | Status: DC | PRN

## 2017-07-13 MED ORDER — PANTOPRAZOLE SODIUM 40 MG PO TBEC
40.0000 mg | DELAYED_RELEASE_TABLET | Freq: Every day | ORAL | Status: DC
  Administered 2017-07-14: 40 mg via ORAL
  Filled 2017-07-13: qty 1

## 2017-07-13 MED ORDER — LINACLOTIDE 72 MCG PO CAPS
72.0000 ug | ORAL_CAPSULE | Freq: Every day | ORAL | Status: DC
  Administered 2017-07-14: 72 ug via ORAL
  Filled 2017-07-13: qty 1

## 2017-07-13 MED ORDER — LOSARTAN POTASSIUM 50 MG PO TABS
50.0000 mg | ORAL_TABLET | Freq: Every day | ORAL | Status: DC
  Administered 2017-07-14: 50 mg via ORAL
  Filled 2017-07-13: qty 1

## 2017-07-13 MED ORDER — ALPHA-LIPOIC ACID 600 MG PO CAPS: 1.0000 | ORAL_CAPSULE | Freq: Every day | ORAL | Status: DC

## 2017-07-13 MED ORDER — NORTRIPTYLINE HCL 10 MG PO CAPS
10.0000 mg | ORAL_CAPSULE | Freq: Every day | ORAL | Status: DC
  Filled 2017-07-13: qty 1

## 2017-07-13 NOTE — ED Notes (Signed)
This RN spoke with pt about leaving. Informed patient that she was next to go back. Pt agreed to stay but wanted to have IV removed from left [facility] due to discomfort. This RN removed IV.

## 2017-07-13 NOTE — Telephone Encounter (Signed)
This encounter was created in error - please disregard.

## 2017-07-13 NOTE — ED Notes (Signed)
Results reviewed no change in acuity at this time

## 2017-07-13 NOTE — H&P (Signed)
History and Physical    Rhonda Henry OZH:[id number] DOB: Sep 19, 1945 DOA: 07/13/2017  PCP: Rhonda Koch, MD  Patient coming from: Home  I have personally briefly reviewed patient's old medical records in [place]  Chief Complaint: CP  HPI: Rhonda Henry is a 72 y.o. female with medical history significant of HTN.  Patient presents to the ED with c/o CP.  First had CP x2 days ago while working out in gym.  L sided chest pressure with diaphoresis and dysnpea.  Resolved with rest.  Yesterday had 3h episode of stabbing L sided CP onset at rest at home.  Pain got to 9/10 severity, resolved during the night.  Today got L sided CP while driving.  EMS called.  Gave her NTG which resolved the pain, then ASA.   ED Course: Trop neg.  Cards consulted, hospitalist to admit.   Review of Systems: As per HPI otherwise 10 point review of systems negative.   Past Medical History:  Diagnosis Date  . Abdominal pain, acute, right upper quadrant    had gall bladder removed and now no issues   . Allergy   . Anxiety   . Arthritis   . Bloating   . Complication of anesthesia    "I shake like crazy"  . Constipation    not now  . Depression    no issues currently   . Difficulty sleeping    no issues now  . GERD (gastroesophageal reflux disease)   . Heart murmur    no recent mention of this per pt.   . Hypertension   . Microscopic hematuria     x 30 yrs   . Nausea   . Nerve damage    face -1 yr ago and now almost resolved   . Neuromuscular disorder ([place])   . Recurrent UTI     Past Surgical History:  Procedure Laterality Date  . breast biopsies    . BREAST LUMPECTOMY     x 2  . c sections     x2  . CHOLECYSTECTOMY N/A 03/13/2013   Procedure: LAPAROSCOPIC CHOLECYSTECTOMY WITH INTRAOPERATIVE CHOLANGIOGRAM;  Surgeon: Rhonda Rhonda Henry. Rhonda Dover, MD;  Location: [facility];  Service: General;  Laterality: N/A;  . COLONOSCOPY    . CYST EXCISION     removed from back and left arm  .  FACIAL COSMETIC SURGERY  7-10   compl by thick scars and a scar necrosis on R cheek  . LUMBAR EPIDURAL INJECTION  Jan 15   . [place]  . 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 She has never used smokeless tobacco. She reports that she drinks alcohol. She reports that she does not use drugs.  Allergies  Allergen Reactions  . Ciprofloxacin Other (See Comments)    Made her neuropathy flare  . Other Hives and Other (See Comments)    Glue - may cause hives?- patient states she was tested and it was fine   NO DRUGS THAT ARE CONTRAINDICATED FOR NEUROPATHY !  . Desipramine Hcl Rash  . Penicillins Hives and Rash     Has patient had a PCN reaction causing immediate rash, facial/tongue/throat swelling, SOB or lightheadedness with hypotension: no Has patient had a PCN reaction causing severe rash involving mucus membranes or skin  necrosis: Yes Has patient had a PCN reaction that required hospitalization No Has patient had a PCN reaction occurring within the last 10 years: No If all of the above answers are "NO", then may proceed with Cephalosporin use.   . Tape Hives    Certain surgical tape - clear surgical tape causes hives     Family History  Problem Relation Age of Onset  . Thyroid cancer Mother   . Heart disease Mother   . Macular degeneration Mother   . Stroke Mother   . Hypertension Mother   . Glaucoma Father   . Pneumonia Father        passed away with this  . Hypertension Other   . Stomach cancer Paternal Grandmother   . Colon cancer Neg Hx   . Colon polyps Neg Hx   . Rectal cancer Neg Hx   . Pancreatic cancer Neg Hx      Prior to Admission medications   Medication Sig Start Date End Date Taking? Authorizing Provider  Alpha-Lipoic Acid 600 MG CAPS Take 1 capsule by  mouth daily.   Yes [provider]  esomeprazole (NEXIUM) 20 MG capsule Take 20 mg by mouth daily.    Yes [provider]  linaclotide Rolan Lipa) 72 MCG capsule Take 1 capsule (72 mcg total) by mouth daily before breakfast. 08/17/16  Yes Rhonda Banister, MD  losartan (COZAAR) 50 MG tablet Take 1 tablet (50 mg total) by mouth daily. 04/19/17  Yes Rhonda Koch, MD  nortriptyline (PAMELOR) 10 MG capsule Take 10 mg by mouth at bedtime. 03/19/15  Yes [provider]  nortriptyline (PAMELOR) 25 MG capsule Take 25 mg by mouth at bedtime. 04/22/15  Yes [provider]    Physical Exam: Vitals:   07/13/17 2046 07/13/17 2144 07/13/17 2200 07/13/17 2300  BP: (!) 154/79 (!) 172/84 (!) 159/74 (!) 170/85  Pulse: 67 71 78 71  Resp: 14 15 20 15   Temp:  (!) 97.5 F (36.4 C)    TempSrc:  Oral    SpO2: 100% 99% 100% 98%  Weight:      Height:        Constitutional: NAD, calm, comfortable Eyes: PERRL, lids and conjunctivae normal ENMT: Mucous membranes are moist. Posterior pharynx clear of any exudate or lesions.Normal dentition.  Neck: normal, supple, no masses, no thyromegaly Respiratory: clear to auscultation bilaterally, no wheezing, no crackles. Normal respiratory effort. No accessory muscle use.  Cardiovascular: Regular rate and rhythm, no murmurs / rubs / gallops. No extremity edema. 2+ pedal pulses. No carotid bruits.  Abdomen: no tenderness, no masses palpated. No hepatosplenomegaly. Bowel sounds positive.  Musculoskeletal: no clubbing / cyanosis. No joint deformity upper and lower extremities. Good ROM, no contractures. Normal muscle tone.  Skin: no rashes, lesions, ulcers. No induration Neurologic: CN 2-12 grossly intact. Sensation intact, DTR normal. Strength 5/5 in all 4.  Psychiatric: Normal judgment and insight. Alert and oriented x 3. Normal mood.    Labs on Admission: I have personally reviewed following labs and imaging studies  CBC: Recent  Labs  Lab 07/13/17 1840  WBC 7.2  HGB 12.5  HCT 40.3  MCV 85.9  PLT 300   Basic Metabolic Panel: Recent Labs  Lab 07/13/17 1840  NA 137  K 4.3  CL 103  CO2 25  GLUCOSE 123*  BUN 13  CREATININE 1.09*  CALCIUM 9.4   GFR: Estimated Creatinine Clearance: 45.1 mL/min (A) (by C-G formula based on SCr of 1.09 mg/dL (  H)). Liver Function Tests: No results for input(s): AST, ALT, ALKPHOS, BILITOT, PROT, ALBUMIN in the last 168 hours. No results for input(s): LIPASE, AMYLASE in the last 168 hours. No results for input(s): AMMONIA in the last 168 hours. Coagulation Profile: No results for input(s): INR, PROTIME in the last 168 hours. Cardiac Enzymes: Recent Labs  Lab 07/13/17 1840  TROPONINI <0.03   BNP (last 3 results) No results for input(s): PROBNP in the last 8760 hours. HbA1C: No results for input(s): HGBA1C in the last 72 hours. CBG: No results for input(s): GLUCAP in the last 168 hours. Lipid Profile: No results for input(s): CHOL, HDL, LDLCALC, TRIG, CHOLHDL, LDLDIRECT in the last 72 hours. Thyroid Function Tests: No results for input(s): TSH, T4TOTAL, FREET4, T3FREE, THYROIDAB in the last 72 hours. Anemia Panel: No results for input(s): VITAMINB12, FOLATE, FERRITIN, TIBC, IRON, RETICCTPCT in the last 72 hours. Urine analysis:    Component Value Date/Time   COLORURINE YELLOW 02/18/2013 2019   APPEARANCEUR CLOUDY (A) 02/18/2013 2019   LABSPEC 1.020 02/18/2013 2019   PHURINE 5.0 02/18/2013 2019   GLUCOSEU NEGATIVE 02/18/2013 2019   GLUCOSEU NEGATIVE 12/12/2007 1807   HGBUR MODERATE (A) 02/18/2013 2019   HGBUR moderate 03/17/2010 1137   BILIRUBINUR negative 03/15/2016 0956   KETONESUR 15 (A) 02/18/2013 2019   PROTEINUR negatvie 03/15/2016 0956   PROTEINUR NEGATIVE 02/18/2013 2019   UROBILINOGEN 0.2 03/15/2016 0956   UROBILINOGEN 0.2 02/18/2013 2019   NITRITE negative 03/15/2016 0956   NITRITE NEGATIVE 02/18/2013 2019   LEUKOCYTESUR Negative 03/15/2016 0956      Radiological Exams on Admission: Dg Chest 2 View  Result Date: 07/13/2017 CLINICAL DATA:  Intermittent chest pain since Monday. Occasional shortness of breath and dizziness. EXAM: CHEST - 2 VIEW COMPARISON:  05/05/2016. FINDINGS: Trachea is midline. Heart size is normal. Bibasilar scarring. Lungs are otherwise clear. No pleural fluid. IMPRESSION: No acute findings. Electronically Signed   By: Lorin Picket M.D.   On: 07/13/2017 20:11    EKG: Independently reviewed.  Assessment/Plan Principal Problem:   Chest pain, rule out acute myocardial infarction Active Problems:   Essential hypertension    1. CP r/o - 1. CP obs pathway 2. Cards at bedside: 1. Plan stress test in AM 3. NPO after MN 4. Serial trops 5. Tele monitor 2. HTN - continue home BP med  DVT prophylaxis: lovenox Code Status: Full Family Communication: Family at bedside Disposition Plan: Home after admit Consults called: Cards at bedside Admission status: Place in [place], [facility] Pager [phone] Only works nights!  If 7AM-7PM, please contact the primary day team physician taking care of patient  [email or web address] Password [facility] [address]  07/13/2017, 11:48 PM

## 2017-07-13 NOTE — ED Notes (Signed)
Cardiology at bedside.

## 2017-07-13 NOTE — ED Notes (Signed)
Returned from X-Ray at 19:43.

## 2017-07-13 NOTE — Telephone Encounter (Signed)
  Pt stated that she had a stabbing pain in her chest at 8 pm last night and felt like it was hard to take a deep breath,she felt dizzy then she took an antacid, took a baby aspirin and went to sleep.  Pt c/o since 4 pm she has felt like she has a "weight on her left breast" with radiation to the left upper arm.  Pain is moderate in severity. Pt has many cardiac risk factors. (see assessment questions) Had pt chew a 325 mg aspirin and have her husband call 911. Pt stated that she will go to [facility] ED.   Reason for Disposition . [1] Chest pain lasts > 5 minutes AND [2] age > 18    Pt states she feels like there is a weight on her left breast since 4 pm.  Answer Assessment - Initial Assessment Questions 1. LOCATION: "Where does it hurt?"       Feels like a weight on her left breast, left upper arm is sore. Last night pain was like a stabbing sensation left side of chest, Monday had pains in the chest at the GYM 2. RADIATION: "Does the pain go anywhere else?" (e.g., into neck, jaw, arms, back)     Left upper arm 3. ONSET: "When did the chest pain begin?" (Minutes, hours or days)      8 pm last night 4. PATTERN "Does the pain come and go, or has it been constant since it started?"  "Does it get worse with exertion?"      Comes and goes-pt states that she has not done as much today  5. DURATION: "How long does it last" (e.g., seconds, minutes, hours)     Started to notice it at 4 pm that heavy or feeling like a weight on her breast 6. SEVERITY: "How bad is the pain?"  (e.g., Scale 1-10; mild, moderate, or severe)    - MILD (1-3): doesn't interfere with normal activities     - MODERATE (4-7): interferes with normal activities or awakens from sleep    - SEVERE (8-10): excruciating pain, unable to do any normal activities       moderate 7. CARDIAC RISK FACTORS: "Do you have any history of heart problems or risk factors for heart disease?" (e.g., prior heart attack, angina; high blood pressure,  diabetes, being overweight, high cholesterol, smoking, or strong family history of heart disease)     Overweight. Maternal side of family (pt's 22 or Uncles had heart disease 8. PULMONARY RISK FACTORS: "Do you have any history of lung disease?"  (e.g., blood clots in lung, asthma, emphysema, birth control pills)     no 9. CAUSE: "What do you think is causing the chest pain?"     Pt does not know 10. OTHER SYMPTOMS: "Do you have any other symptoms?" (e.g., dizziness, nausea, vomiting, sweating, fever, difficulty breathing, cough)       Last night with dizziness 11. PREGNANCY: "Is there any chance you are pregnant?" "When was your last menstrual period?"       n/a  Protocols used: CHEST PAIN-A-AH

## 2017-07-13 NOTE — ED Notes (Signed)
ED Provider at bedside. 

## 2017-07-13 NOTE — ED Provider Notes (Signed)
Patient placed in Quick Look pathway, seen and evaluated   Chief Complaint: Chest pain  HPI:  72 year old female presents with chest pain for for the past 2 days.  She states it initially started while she was at the gym on the elliptical.  It got better with rest.  She had the pain again when she stood up from a sitting position she got very lightheaded and started having sharp chest pain over the left side.  Today when she was driving home at 9:89 PM she felt chest pressure which she estimates it was about a 6 out of 10.  She called her doctor's office and told about her symptoms and they advised to call ambulance.  She received 3 nitroglycerin prior to arrival and this has improved her chest pain to a 1 out of 10.  She denies any cardiac or lung history.  She denies syncope, diaphoresis, nausea.  She does not smoke.  She has family history of heart problems on the her mother side.  He has chronic leg swelling which is not worse than normal.  She has mild shortness of breath.  She has chronic leg swelling which is not worse than normal.  ROS: +chest pain  Physical Exam:   Gen: No distress, pleasant  Neuro: Awake and Alert  Skin: Warm    Focused Exam: Heart: Regular rate and rhythm    Lungs: CTA    Extremities: Bilateral non-pitting edema which she states is chronic   Initiation of care has begun. The patient has been counseled on the process, plan, and necessity for staying for the completion/evaluation, and the remainder of the medical screening examination    Recardo Evangelist, PA-C 07/13/17 Junious Dresser    Duffy Bruce, MD 07/13/17 2320

## 2017-07-13 NOTE — ED Provider Notes (Signed)
[facility] EMERGENCY DEPARTMENT Provider Note   CSN: [id number] Arrival date & time: 07/13/17  1814   History   Chief Complaint Chief Complaint  Patient presents with  . Chest Pain    HPI Rhonda Henry is a 72 y.o. female with a medical history of HTN who presents with chest pain. She reports that she first had chest pain 2 days ago while working out at [employer]. At that time she felt left-sided chest pressure with diaphoresis and dyspnea that resolved with rest. Yesterday she had a 3-hour episode of stabbing left-sided chest pain that began while relaxing at home. This pain reached a 9/10 in severity and resolved during the night. Today she began to experience left chest pressure while driving. EMS gave her nitro, which resolved the pain, and aspirin.  She has baseline left shoulder and arm pain, so she cannot tell whether the pain is radiating. She has not exercised since experiencing chest pain 2 days ago because she fears the chest pain. She is currently pain-free and not dyspneic. She endorses dizziness while standing and her normal amount of leg swelling, but denies syncope, cough, and abdominal pain.    Past Medical History:  Diagnosis Date  . Abdominal pain, acute, right upper quadrant    had gall bladder removed and now no issues   . Allergy   . Anxiety   . Arthritis   . Bloating   . Complication of anesthesia    "I shake like crazy"  . Constipation    not now  . Depression    no issues currently   . Difficulty sleeping    no issues now  . GERD (gastroesophageal reflux disease)   . Heart murmur    no recent mention of this per pt.   . Hypertension   . Microscopic hematuria     x 30 yrs   . Nausea   . Nerve damage    face -1 yr ago and now almost resolved   . Neuromuscular disorder ([place])   . Recurrent UTI     Patient Active Problem List   Diagnosis Date Noted  . Travel advice encounter 12/05/2015  . Lightheadedness 07/18/2015  . SOB  (shortness of breath) on exertion 07/18/2015  . Routine general medical examination at a health care facility 06/06/2015  . At high risk for cardiac arrhythmia 02/28/2015  . Numbness and tingling in left arm 02/26/2015  . Constipation 06/12/2014  . Dental disease 11/25/2013  . Obesity 09/02/2013  . Multinodular goiter 02/26/2013  . Liver hemangioma 08/09/2012  . Lumbar herniated disc 05/02/2012  . 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 VITAMIN D DEFICIENCY 05/26/2009  . Essential hypertension 07/30/2006    Past Surgical History:  Procedure Laterality Date  . breast biopsies    . BREAST LUMPECTOMY     x 2  . c sections     x2  . CHOLECYSTECTOMY N/A 03/13/2013   Procedure: LAPAROSCOPIC CHOLECYSTECTOMY WITH INTRAOPERATIVE CHOLANGIOGRAM;  Surgeon: [name]. [name], MD;  Location: [facility];  Service: General;  Laterality: N/A;  . COLONOSCOPY    . CYST EXCISION     removed from back and left arm  . FACIAL COSMETIC SURGERY  7-10   compl by thick scars and a scar necrosis on R cheek  . LUMBAR EPIDURAL INJECTION  Jan 15   . [place]  . TOTAL KNEE ARTHROPLASTY Left 06/17/2015  Procedure: LEFT TOTAL KNEE ARTHROPLASTY;  Surgeon: Paralee Cancel, MD;  Location: [facility];  Service: Orthopedics;  Laterality: Left;  Spinal to General     OB History    Gravida  3   Para  2   Term      Preterm      AB  1   Living        SAB      TAB      Ectopic      Multiple      Live Births               Home Medications    Prior to Admission medications   Medication Sig Start Date End Date Taking? Authorizing Provider  Alpha-Lipoic Acid 600 MG CAPS Take 1 capsule by mouth daily.    [provider]  clindamycin (CLEOCIN) 150 MG capsule Take 150 mg by mouth as needed (Take after sex.).  11/07/13   [provider]  diazepam (VALIUM) 5 MG tablet Take 5 mg by mouth at bedtime as needed.      [provider]  esomeprazole (NEXIUM) 20 MG capsule Take 20 mg by mouth daily.     [provider]  linaclotide Rolan Lipa) 72 MCG capsule Take 1 capsule (72 mcg total) by mouth daily before breakfast. 08/17/16   Milus Banister, MD  losartan (COZAAR) 50 MG tablet Take 1 tablet (50 mg total) by mouth daily. 04/19/17   Hoyt Koch, MD  nortriptyline (PAMELOR) 10 MG capsule Take 10 mg by mouth at bedtime. 03/19/15   [provider]  nortriptyline (PAMELOR) 25 MG capsule Take 25 mg by mouth at bedtime. 04/22/15   [provider]    Family History Family History  Problem Relation Age of Onset  . Thyroid cancer Mother   . Heart disease Mother   . Macular degeneration Mother   . Stroke Mother   . Hypertension Mother   . Glaucoma Father   . Pneumonia Father        passed away with this  . Hypertension Other   . Stomach cancer Paternal Grandmother   . Colon cancer Neg Hx   . Colon polyps Neg Hx   . Rectal cancer Neg Hx   . Pancreatic cancer Neg Hx     Social History Social History   Tobacco Use  . Smoking status: Never Smoker  . Smokeless tobacco: Never Used  Substance Use Topics  . Alcohol use: Yes    Alcohol/week: 0.0 oz    Comment: socially 1-2 glasses weekly  . Drug use: No     Allergies   Ciprofloxacin; Other; Desipramine hcl; Penicillins; and Tape   Review of Systems Review of Systems  Constitutional: Negative for chills and fever.  HENT: Negative for congestion and rhinorrhea.   Eyes: Negative for visual disturbance.  Respiratory: Negative for cough and shortness of breath.   Cardiovascular: Positive for chest pain and leg swelling. Negative for palpitations.  Gastrointestinal: Negative for abdominal pain, nausea and vomiting.  Genitourinary: Negative for difficulty urinating and dysuria.  Musculoskeletal: Negative for back pain.       Baseline shoulder pain  Skin: Negative for rash and wound.  Neurological: Positive  for dizziness. Negative for headaches.     Physical Exam Updated Vital Signs BP (!) 172/84 (BP Location: Right Arm)   Pulse 71   Temp (!) 97.5 F (36.4 C) (Oral)   Resp 15   Ht 5\' 1"  (1.549 m)  Wt 81.6 kg (180 lb)   SpO2 99%   BMI 34.01 kg/m   Physical Exam  Constitutional: She is oriented to person, place, and time. She appears well-developed and well-nourished. She does not appear ill.  HENT:  Head: Normocephalic and atraumatic.  Eyes: Pupils are equal, round, and reactive to light. EOM are normal.  Cardiovascular: Normal rate and regular rhythm. Exam reveals no gallop and no friction rub.  No murmur heard. Pulses:      Radial pulses are 2+ on the right side, and 2+ on the left side.       Dorsalis pedis pulses are 2+ on the right side, and 2+ on the left side.       Posterior tibial pulses are 2+ on the right side, and 2+ on the left side.  Pulmonary/Chest: Effort normal and breath sounds normal. No respiratory distress.  Abdominal: Soft. Bowel sounds are normal. She exhibits no distension. There is no tenderness. There is no guarding.  Musculoskeletal:  1+ BLE pitting edema  Neurological: She is alert and oriented to person, place, and time.  Skin: Skin is warm and dry. Capillary refill takes less than 2 seconds. Rash noted.  Psychiatric: She has a normal mood and affect. Her behavior is normal.     ED Treatments / Results  Labs (all labs ordered are listed, but only abnormal results are displayed) Labs Reviewed  BASIC METABOLIC PANEL - Abnormal; Notable for the following components:      Result Value   Glucose, Bld 123 (*)    Creatinine, Ser 1.09 (*)    GFR calc non Af Amer 49 (*)    GFR calc Af Amer 57 (*)    All other components within normal limits  CBC  TROPONIN I  I-STAT TROPONIN, ED    EKG EKG Interpretation  Date/Time:  Wednesday July 13 2017 18:19:05 EDT Ventricular Rate:  70 PR Interval:  176 QRS Duration: 78 QT Interval:  400 QTC  Calculation: 432 R Axis:   47 Text Interpretation:  Normal sinus rhythm Septal infarct , age undetermined Abnormal ECG No previous ECGs available Confirmed by Wandra Arthurs ([id number]) on 07/13/2017 9:17:49 PM   Radiology Dg Chest 2 View  Result Date: 07/13/2017 CLINICAL DATA:  Intermittent chest pain since Monday. Occasional shortness of breath and dizziness. EXAM: CHEST - 2 VIEW COMPARISON:  05/05/2016. FINDINGS: Trachea is midline. Heart size is normal. Bibasilar scarring. Lungs are otherwise clear. No pleural fluid. IMPRESSION: No acute findings. Electronically Signed   By: Lorin Picket M.D.   On: 07/13/2017 20:11    Procedures Procedures (including critical care time)  Medications Ordered in ED Medications - No data to display   Initial Impression / Assessment and Plan / ED Course  I have reviewed the triage vital signs and the nursing notes.  Pertinent labs & imaging results that were available during my care of the patient were reviewed by me and considered in my medical decision making (see chart for details).  MARGUETTA WINDISH is a 72 y.o. female with a medical history of HTN who presents with one episode of exertional chest pain relieved by rest and one episode of non-exertion chest pain relieved by nitro. Upon arrival to the ED, patient was afebrile and hemodynamically stable. Physical exam was unremarkable. Labs were significant for negative troponin. EKG showed no changes from prior. CXR was unremarkable. Her story of chest pain is very concerning for cardiac etiology and she is high risk according to  the heart pathway. Therefore, this patient warrants admission for stress test in the morning. Patient accepted by Dr. Alcario Drought.  Final Clinical Impressions(s) / ED Diagnoses   Final diagnoses:  None    ED Discharge Orders    None       Dorrell, Andree Elk, MD 07/13/17 2352    Drenda Freeze, MD 07/14/17 [phone]

## 2017-07-13 NOTE — ED Triage Notes (Signed)
The pt arrived by [facility] from home she has had  Chest pain and pressure  Since Monday.  The pt was  At the gym when she had chest pain  After she stopped the pain went away  Again yesterday the pt had chest pain and dizziness with chest pain.  Today the chest pain returned  She felt like there was a prfessure in her chest ems was called  She had one adult aspirin and 3 sl nitro  That decreased her pain.  Iv per ems

## 2017-07-13 NOTE — ED Notes (Addendum)
Pt requested IV removed. This EMT went to speak with pt and attempt to mediate pt from leaving. This EMT approached pt, asking how I could help. Pt confirmed that she would like the IV removed. This EMT advised pt that the IV was left in due to continued care. Pt stated that she has " been to 3 [facility] for heart concerns by EMS, and that nothing was done". "It was the cardiologist they sent me to for referral that figured out the issue, not you guys. You do nothing!". "I swore to never return". "I don't want to be here around all these horrible people in the lobby. To me they are horrible people." This EMT immediately informed Camera operator.

## 2017-07-14 ENCOUNTER — Observation Stay (HOSPITAL_COMMUNITY): Payer: Medicare Other

## 2017-07-14 ENCOUNTER — Other Ambulatory Visit: Payer: Self-pay

## 2017-07-14 DIAGNOSIS — R0789 Other chest pain: Secondary | ICD-10-CM | POA: Diagnosis not present

## 2017-07-14 DIAGNOSIS — E785 Hyperlipidemia, unspecified: Secondary | ICD-10-CM | POA: Diagnosis not present

## 2017-07-14 DIAGNOSIS — I10 Essential (primary) hypertension: Secondary | ICD-10-CM | POA: Diagnosis not present

## 2017-07-14 DIAGNOSIS — R079 Chest pain, unspecified: Secondary | ICD-10-CM

## 2017-07-14 LAB — TROPONIN I: Troponin I: <0.03 ng/mL (ref <0.03)

## 2017-07-14 LAB — MRSA PCR SCREENING: MRSA BY PCR: NEGATIVE

## 2017-07-14 MED ORDER — NORTRIPTYLINE HCL 25 MG PO CAPS
35.0000 mg | ORAL_CAPSULE | Freq: Every day | ORAL | Status: DC
  Administered 2017-07-14: 35 mg via ORAL
  Filled 2017-07-14: qty 1

## 2017-07-14 MED ORDER — NITROGLYCERIN 0.4 MG SL SUBL
SUBLINGUAL_TABLET | SUBLINGUAL | Status: AC
Start: 2017-07-14 — End: 2017-07-14
  Administered 2017-07-14: 0.8 mg
  Filled 2017-07-14: qty 2

## 2017-07-14 MED ORDER — IOPAMIDOL (ISOVUE-370) INJECTION 76%
100.0000 mL | Freq: Once | INTRAVENOUS | Status: AC | PRN
  Administered 2017-07-14: 100 mL via INTRAVENOUS

## 2017-07-14 MED ORDER — METOPROLOL TARTRATE 5 MG/5ML IV SOLN
INTRAVENOUS | Status: AC
Start: 2017-07-14 — End: 2017-07-14
  Administered 2017-07-14: 5 mg
  Filled 2017-07-14: qty 5

## 2017-07-14 MED ORDER — IOPAMIDOL (ISOVUE-370) INJECTION 76%
INTRAVENOUS | Status: AC
  Filled 2017-07-14: qty 100

## 2017-07-14 MED ORDER — METOPROLOL TARTRATE 50 MG PO TABS
50.0000 mg | ORAL_TABLET | Freq: Once | ORAL | Status: AC
  Administered 2017-07-14: 50 mg via ORAL
  Filled 2017-07-14: qty 1

## 2017-07-14 NOTE — Progress Notes (Signed)
PHARMACIST - PHYSICIAN ORDER COMMUNICATION  CONCERNING: P&T Medication Policy on Herbal Medications  DESCRIPTION:  This patient's order for: Alpha lipoic acid has been noted.  This product(s) is classified as an "herbal" or natural product. Due to a lack of definitive safety studies or FDA approval, nonstandard manufacturing practices, plus the potential risk of unknown drug-drug interactions while on inpatient medications, the Pharmacy and Therapeutics Committee does not permit the use of "herbal" or natural products of this type within [place].   ACTION TAKEN: The pharmacy department is unable to verify this order at this time and your patient has been informed of this safety policy. Please reevaluate patient's clinical condition at discharge and address if the herbal or natural product(s) should be resumed at that time.  

## 2017-07-14 NOTE — Progress Notes (Signed)
Pt going home with husband via w/c with belongings. No new medication, follow up appt with primary on July 12th. No complaints at this time.

## 2017-07-14 NOTE — Telephone Encounter (Signed)
fyi

## 2017-07-14 NOTE — Progress Notes (Signed)
Progress Note  Patient Name: Rhonda Henry Date of Encounter: 07/14/2017  Primary Cardiologist: No primary care provider on file.   Subjective   72 year old female with a history of hypertension and obesity presented to the hospital last night with episodes of chest pain.  Inpatient Medications    Scheduled Meds: . enoxaparin (LOVENOX) injection  40 mg Subcutaneous Q24H  . linaclotide  72 mcg Oral QAC breakfast  . losartan  50 mg Oral Daily  . nortriptyline  35 mg Oral QHS  . pantoprazole  40 mg Oral Daily   Continuous Infusions:  PRN Meds: acetaminophen, ondansetron (ZOFRAN) IV   Vital Signs    Vitals:   07/14/17 0044 07/14/17 0300 07/14/17 0329 07/14/17 0750  BP: (!) 155/76  (!) 155/70 130/89  Pulse: 74 76 71 78  Resp: 16 (!) 23 18 16   Temp: 97.7 F (36.5 C)  98 F (36.7 C) 98 F (36.7 C)  TempSrc: Oral  Oral Oral  SpO2: 100% 95% 100% 97%  Weight: 172 lb 2.9 oz (78.1 kg)     Height: 5\' 1"  (1.549 m)      No intake or output data in the 24 hours ending 07/14/17 0825 Filed Weights   07/13/17 1830 07/14/17 0044  Weight: 180 lb (81.6 kg) 172 lb 2.9 oz (78.1 kg)    Telemetry    NSR  - Personally Reviewed  ECG     NSR , no ST or T wave changes.  - Personally Reviewed  Physical Exam   GEN: No acute distress.   Neck: No JVD Cardiac: RRR, no murmurs, rubs, or gallops.  Respiratory: Clear to auscultation bilaterally. GI: Soft, nontender, non-distended  MS: No edema; No deformity. Neuro:  Nonfocal  Psych: Normal affect   Labs    Chemistry Recent Labs  Lab 07/13/17 1840  NA 137  K 4.3  CL 103  CO2 25  GLUCOSE 123*  BUN 13  CREATININE 1.09*  CALCIUM 9.4  GFRNONAA 49*  GFRAA 57*  ANIONGAP 9     Hematology Recent Labs  Lab 07/13/17 1840  WBC 7.2  RBC 4.69  HGB 12.5  HCT 40.3  MCV 85.9  MCH 26.7  MCHC 31.0  RDW 13.3  PLT 285    Cardiac Enzymes Recent Labs  Lab 07/13/17 1840 07/14/17 0049 07/14/17 0337  TROPONINI <0.03  <0.03 <0.03    Recent Labs  Lab 07/13/17 2238  TROPIPOC 0.00     BNPNo results for input(s): BNP, PROBNP in the last 168 hours.   DDimer No results for input(s): DDIMER in the last 168 hours.   Radiology    Dg Chest 2 View  Result Date: 07/13/2017 CLINICAL DATA:  Intermittent chest pain since Monday. Occasional shortness of breath and dizziness. EXAM: CHEST - 2 VIEW COMPARISON:  05/05/2016. FINDINGS: Trachea is midline. Heart size is normal. Bibasilar scarring. Lungs are otherwise clear. No pleural fluid. IMPRESSION: No acute findings. Electronically Signed   By: Lorin Picket M.D.   On: 07/13/2017 20:11    Cardiac Studies     Patient Profile     72 y.o. female with history of hypertension.  She presents with angina like symptoms.  Assessment & Plan    1.  Chest discomfort: Patient presents with chest pain.  There are some typical and atypical features of this chest pain.  The episodes originally started with exertion.  She also had an episode of rest last night. Upon her levels are negative. We will get  a coronary CT angiogram for further evaluation.  I will cancel the stress echocardiogram. Metoprolol 50 mg  p.o.   to help slow her heart rate for the CT Angiogram .  2.  Hypertension: Continue home medications for now.     For questions or updates, please contact Center Please consult [email or web address] for contact info under Cardiology/STEMI.      Signed, Mertie Moores, MD  07/14/2017, 8:25 AM

## 2017-07-14 NOTE — Consult Note (Signed)
CARDIOLOGY CONSULT NOTE   Patient ID: Rhonda Henry MRN: [id number], DOB/AGE: 1945-12-20   Admit date: 07/13/2017 Date of Consult: 07/14/2017   Primary Physician: Hoyt Koch, MD  Consulting Physician: Dr. Alcario Drought Reason for Consult: chest pain  Pt. Profile 72 yo female with history of HTN and obesity who presents with chest pain  Problem List  Past Medical History:  Diagnosis Date  . Abdominal pain, acute, right upper quadrant    had gall bladder removed and now no issues   . Allergy   . Anxiety   . Arthritis   . Bloating   . Complication of anesthesia    "I shake like crazy"  . Constipation    not now  . Depression    no issues currently   . Difficulty sleeping    no issues now  . GERD (gastroesophageal reflux disease)   . Heart murmur    no recent mention of this per pt.   . Hypertension   . Microscopic hematuria     x 30 yrs   . Nausea   . Nerve damage    face -1 yr ago and now almost resolved   . Neuromuscular disorder ([place])   . Recurrent UTI     Past Surgical History:  Procedure Laterality Date  . breast biopsies    . BREAST LUMPECTOMY     x 2  . c sections     x2  . CHOLECYSTECTOMY N/A 03/13/2013   Procedure: LAPAROSCOPIC CHOLECYSTECTOMY WITH INTRAOPERATIVE CHOLANGIOGRAM;  Surgeon: [name]. [name], MD;  Location: [facility];  Service: General;  Laterality: N/A;  . COLONOSCOPY    . CYST EXCISION     removed from back and left arm  . FACIAL COSMETIC SURGERY  7-10   compl by thick scars and a scar necrosis on R cheek  . LUMBAR EPIDURAL INJECTION  Jan 15   . [place]  . 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 Ciprofloxacin Other (See Comments)    Made her neuropathy flare  . Other Hives and Other (See Comments)    Glue -  may cause hives?- patient states she was tested and it was fine   NO DRUGS THAT ARE CONTRAINDICATED FOR NEUROPATHY !  . Desipramine Hcl Rash  . Penicillins Hives and Rash     Has patient had a PCN reaction causing immediate rash, facial/tongue/throat swelling, SOB or lightheadedness with hypotension: no Has patient had a PCN reaction causing severe rash involving mucus membranes or skin necrosis: Yes Has patient had a PCN reaction that required hospitalization No Has patient had a PCN reaction occurring within the last 10 years: No If all of the above answers are "NO", then may proceed with Cephalosporin use.   . Tape Hives    Certain surgical tape - clear surgical tape causes hives     HPI  Rhonda Henry has had HTN since her 27s. Previously she was very sedentary until undergoing a knee operation and realizing that if she were to undergo another operation she would need to lose weight.   Since then she has been exercising at the gym 5 times a week. However over the last 2-3 days she has been more fatigued than regular. And has been experiencing chest pain  and discomfort while on the elliptical that resolved with rest. Then on 7/10 she was driving and experienced chest pain that lead her to come the ED. Resolved with SLN.    Inpatient Medications  . enoxaparin (LOVENOX) injection  40 mg Subcutaneous Q24H  . linaclotide  72 mcg Oral QAC breakfast  . losartan  50 mg Oral Daily  . nortriptyline  35 mg Oral QHS  . pantoprazole  40 mg Oral Daily    Family History Family History  Problem Relation Age of Onset  . Thyroid cancer Mother   . Heart disease Mother   . Macular degeneration Mother   . Stroke Mother   . Hypertension Mother   . Glaucoma Father   . Pneumonia Father        passed away with this  . Hypertension Other   . Stomach cancer Paternal Grandmother   . Colon cancer Neg Hx   . Colon polyps Neg Hx   . Rectal cancer Neg Hx   . Pancreatic cancer Neg Hx      Social  History Social History   Socioeconomic History  . Marital status: Married    Spouse name: Herbie [place]  . Number of children: 2  . Years of education: 28  . Highest education level: Not on file  Occupational History  . 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 Financial resource strain: Not on file  . Food insecurity:    Worry: Not on file    Inability: Not on file  . Transportation needs:    Medical: Not on file    Non-medical: Not on file  Tobacco Use  . Smoking status: Never Smoker  . Smokeless tobacco: Never Used  Substance and Sexual Activity  . Alcohol use: Yes    Alcohol/week: 0.0 oz    Comment: socially 1-2 glasses weekly  . Drug use: No  . Sexual activity: Yes    Birth control/protection: Other-see comments  Lifestyle  . Physical activity:    Days per week: Not on file    Minutes per session: Not on file  . Stress: Not on file  Relationships  . Social connections:    Talks on phone: Not on file    Gets together: Not on file    Attends religious service: Not on file    Active member of club or organization: Not on file    Attends meetings of clubs or organizations: Not on file    Relationship status: Not on file  . Intimate partner violence:    Fear of current or ex partner: Not on file    Emotionally abused: Not on file    Physically abused: Not on file    Forced sexual activity: Not on file  Other Topics Concern  . Not on file  Social History Narrative   Lives with husband in a 2 story home.  Has 2 grown children.  Owns her own business.  Education: Forensic psychologist.      Review of Systems  General:  No chills, fever, night sweats or weight changes.  Cardiovascular:  +chest pain, no dyspnea on exertion, edema, orthopnea, palpitations, paroxysmal nocturnal dyspnea. Dermatological: No rash, lesions/masses Respiratory: No cough, dyspnea Urologic: No hematuria, dysuria Abdominal:   No  nausea, vomiting, diarrhea, bright red blood per rectum, melena, or hematemesis Neurologic:  No visual changes, wkns, changes in mental status. All other systems reviewed and are otherwise negative except  as noted above.  Physical Exam  Blood pressure (!) 153/82, pulse 83, temperature (!) 97.5 F (36.4 C), temperature source Oral, resp. rate 18, height 5\' 1"  (1.549 m), weight 81.6 kg (180 lb), SpO2 98 %.  General: Pleasant, NAD Psych: Normal affect. Neuro: Alert and oriented X 3. Moves all extremities spontaneously. HEENT: Normal  Neck: Supple without bruits or JVD. Lungs:  Resp regular and unlabored, CTA. Heart: RRR no s3, s4, or murmurs. Abdomen: Soft, non-tender, non-distended, BS + x 4.  Extremities: No clubbing, cyanosis or edema. DP/PT/Radials 2+ and equal bilaterally.  Labs  Recent Labs    07/13/17 1840  TROPONINI <0.03   Lab Results  Component Value Date   WBC 7.2 07/13/2017   HGB 12.5 07/13/2017   HCT 40.3 07/13/2017   MCV 85.9 07/13/2017   PLT 285 07/13/2017    Recent Labs  Lab 07/13/17 1840  NA 137  K 4.3  CL 103  CO2 25  BUN 13  CREATININE 1.09*  CALCIUM 9.4  GLUCOSE 123*   Lab Results  Component Value Date   CHOL 176 09/09/2016   HDL 41.00 09/09/2016   LDLCALC 114 (H) 09/09/2016   TRIG 106.0 09/09/2016   Lab Results  Component Value Date   DDIMER 3.48 (H) 07/18/2015    Radiology/Studies  Dg Chest 2 View  Result Date: 07/13/2017 CLINICAL DATA:  Intermittent chest pain since Monday. Occasional shortness of breath and dizziness. EXAM: CHEST - 2 VIEW COMPARISON:  05/05/2016. FINDINGS: Trachea is midline. Heart size is normal. Bibasilar scarring. Lungs are otherwise clear. No pleural fluid. IMPRESSION: No acute findings. Electronically Signed   By: Lorin Picket M.D.   On: 07/13/2017 20:11    ECG NSR, no ST or T wave abnormalities ================ ASSESSMENT AND PLAN 72 yo female with history of HTN and obesity who presents with typical  angina  # Typical and atypical angina: denies any active chest pain. Had episodes with exercise then while driving. Trop negative and ECG w/o significant ST or T inversions. TTE stress in 2012 was normal.  - serial trops - TTE stress test given symptoms ordered  - if positive will discuss with patient about next steps (medical management vs. More invasive testing)  # HTN - needs likely improved HTN control w/ outpatient follow-up  # HLD: ASCVD 47yr risk is 28.6%. LDL 114 on 2018 - would start a moderate intensity statin  Signed, Charlies Silvers, MD

## 2017-07-14 NOTE — Plan of Care (Signed)
Pt to be discharged later today with husband

## 2017-07-14 NOTE — Progress Notes (Signed)
Pt. Is refusing troponin lab draw. Will notify MD.

## 2017-07-14 NOTE — Discharge Instructions (Signed)
Chest Wall Pain °Chest wall pain is pain in or around the bones and muscles of your chest. Sometimes, an injury causes this pain. Sometimes, the cause may not be known. This pain may take several weeks or longer to get better. °Follow these instructions at home: °Pay attention to any changes in your symptoms. Take these actions to help with your pain: °· Rest as told by your health care provider. °· Avoid activities that cause pain. These include any activities that use your chest muscles or your abdominal and side muscles to lift heavy items. °· If directed, apply ice to the painful area: °? Put ice in a plastic bag. °? Place a towel between your skin and the bag. °? Leave the ice on for 20 minutes, 2-3 times per day. °· Take over-the-counter and prescription medicines only as told by your health care provider. °· Do not use tobacco products, including cigarettes, chewing tobacco, and e-cigarettes. If you need help quitting, ask your health care provider. °· Keep all follow-up visits as told by your health care provider. This is important. ° °Contact a health care provider if: °· You have a fever. °· Your chest pain becomes worse. °· You have new symptoms. °Get help right away if: °· You have nausea or vomiting. °· You feel sweaty or light-headed. °· You have a cough with phlegm (sputum) or you cough up blood. °· You develop shortness of breath. °This information is not intended to replace advice given to you by your health care provider. Make sure you discuss any questions you have with your health care provider. °Document Released: 12/21/2004 Document Revised: 05/01/2015 Document Reviewed: 03/18/2014 °Elsevier Interactive Patient Education © 2018 [employer] ° °

## 2017-07-14 NOTE — Plan of Care (Signed)
Continue current care plan 

## 2017-07-14 NOTE — Progress Notes (Signed)
  PROGRESS NOTE  Rhonda Henry AVW:[id number] DOB: September 05, 1945 DOA: 07/13/2017 PCP: Hoyt Koch, MD  Brief Narrative: 53yow PMH HTN, presented with c/o CP. 1st episode while on elliptical at gym, with diaphoresis and SOB, resolved with rest. Second episode at rest, resolved at night. Third episode while driving. NTG relieved pain. Admitted for CP evaluation.  Assessment/Plan CP with typical and atypical symptoms. Troponins negative thus far. EKG nonacute. RF HTN. No known CAD. No h/o VTE, no RF for VTE. Well's score is 0. Chronic LE edema without change. No reason to suspect VTE. --resolved. Refused troponin this AM --cardiology rec coronary CTA rather than stress echo  Essential HTN --stable. Will continue losartan  Hyperlipidemia --consider statin   Will f/u coronary CTA and final cardiology recs. Possible d/c later today if cleared by cardiology  DVT prophylaxis: enoxaparin Code Status: full  Family Communication:  Disposition Plan: home    Murray Hodgkins, MD  [facility] Direct contact: [phone] --[address] [place]  --[email or web address]; password [email or web address]  7PM-7AM contact night coverage as above 07/14/2017, 7:49 AM  LOS: 0 days   Consultants:  Cardiology   Procedures:    Antimicrobials:    Interval history/Subjective: Feels fine, no CP. No recent surgery, no immobilization, no recent travel. No h/o VTE. No cardiac history.  Objective: Vitals:  Vitals:   07/14/17 0300 07/14/17 0329  BP:  (!) 155/70  Pulse: 76 71  Resp: (!) 23 18  Temp:  98 F (36.7 C)  SpO2: 95% 100%    Exam:  Constitutional:  . Appears calm and comfortable Eyes:  . pupils and irises appear normal ENMT:  . grossly normal hearing  Respiratory:  . CTA bilaterally, no w/r/r.  . Respiratory effort normal.  Cardiovascular:  . RRR, no m/r/g . 1+ BLE extremity edema   Psychiatric:  . Mental status o Mood, affect appropriate . judgment and insight appear intact      I have personally reviewed the following:   Labs:  Troponins negative thus far  BMP unremarkable  CBC unremarkable  Imaging studies:  CXR independent review, NAD  Medical tests:  EKG SR, no acute changes   Test discussed with performing physician:    Decision to obtain old records:    Review and summation of old records:    Scheduled Meds: . enoxaparin (LOVENOX) injection  40 mg Subcutaneous Q24H  . linaclotide  72 mcg Oral QAC breakfast  . losartan  50 mg Oral Daily  . nortriptyline  35 mg Oral QHS  . pantoprazole  40 mg Oral Daily   Continuous Infusions:  Principal Problem:   Chest pain, rule out acute myocardial infarction Active Problems:   Essential hypertension   LOS: 0 days

## 2017-07-14 NOTE — Discharge Summary (Signed)
Physician Discharge Summary  Rhonda Henry DXA:[id number] DOB: 1945-12-09 DOA: 07/13/2017  PCP: Hoyt Koch, MD  Admit date: 07/13/2017 Discharge date: 07/14/2017  Recommendations for Outpatient Follow-up:   Hyperlipidemia --consider statin as outpatient  Follow-up Information    Hoyt Koch, MD. Schedule an appointment as soon as possible for a visit in 1 week(s).   Specialty:  Internal Medicine Contact information: [place]  [id number] [phone]            Discharge Diagnoses:  1. CP with typical and atypical symptoms 2. Essential HTN 3. Hyperlipidemia  Discharge Condition: improved Disposition: home  Diet recommendation: heart healthy  Filed Weights   07/13/17 1830 07/14/17 0044  Weight: 81.6 kg (180 lb) 78.1 kg (172 lb 2.9 oz)    History of present illness:  65yow PMH HTN, presented with c/o CP. 1st episode while on elliptical at gym, with diaphoresis and SOB, resolved with rest. Second episode at rest, resolved at night. Third episode while driving. NTG relieved pain. Admitted for CP evaluation.  Hospital Course:  Patient was observed overnight. CP resolved, troponins negative and EKG nonacute. Seen by cardiology with rec for coronary CTA, which was interpreted as normal. Cardiology recommended discharge home, no further workup. No evidence of CAD.  CP with typical and atypical symptoms. Troponins negative thus far. EKG nonacute. RF HTN. No known CAD. No h/o VTE, no RF for VTE. Well's score is 0. Chronic LE edema without change. No reason to suspect VTE.  Essential HTN --stable. Continue losartan  Hyperlipidemia --consider statin as outpatient  Today's assessment: See progress note same day.  Discharge Instructions  Discharge Instructions    Activity as tolerated - No restrictions   Complete by:  As directed    Diet - low sodium heart healthy   Complete by:  As directed    Discharge instructions    Complete by:  As directed    Call your physician or seek immediate medical attention for chest pain, shortness of breath or worsening of condition.     Allergies as of 07/14/2017      Reactions   Ciprofloxacin Other (See Comments)   Made her neuropathy flare   Other Hives, Other (See Comments)   Glue - may cause hives?- patient states she was tested and it was fine  NO DRUGS THAT ARE CONTRAINDICATED FOR NEUROPATHY !   Desipramine Hcl Rash   Penicillins Hives, Rash    Has patient had a PCN reaction causing immediate rash, facial/tongue/throat swelling, SOB or lightheadedness with hypotension: no Has patient had a PCN reaction causing severe rash involving mucus membranes or skin necrosis: Yes Has patient had a PCN reaction that required hospitalization No Has patient had a PCN reaction occurring within the last 10 years: No If all of the above answers are "NO", then may proceed with Cephalosporin use.   Tape Hives   Certain surgical tape - clear surgical tape causes hives       Medication List    TAKE these medications   Alpha-Lipoic Acid 600 MG Caps Take 1 capsule by mouth daily.   esomeprazole 20 MG capsule Commonly known as:  NEXIUM Take 20 mg by mouth daily.   linaclotide 72 MCG capsule Commonly known as:  LINZESS Take 1 capsule (72 mcg total) by mouth daily before breakfast.   losartan 50 MG tablet Commonly known as:  COZAAR Take 1 tablet (50 mg total) by mouth daily.   nortriptyline 10 MG capsule Commonly  known as:  PAMELOR Take 10 mg by mouth at bedtime.   nortriptyline 25 MG capsule Commonly known as:  PAMELOR Take 25 mg by mouth at bedtime.      Allergies  Allergen Reactions  . Ciprofloxacin Other (See Comments)    Made her neuropathy flare  . Other Hives and Other (See Comments)    Glue - may cause hives?- patient states she was tested and it was fine   NO DRUGS THAT ARE CONTRAINDICATED FOR NEUROPATHY !  . Desipramine Hcl Rash  . Penicillins Hives  and Rash     Has patient had a PCN reaction causing immediate rash, facial/tongue/throat swelling, SOB or lightheadedness with hypotension: no Has patient had a PCN reaction causing severe rash involving mucus membranes or skin necrosis: Yes Has patient had a PCN reaction that required hospitalization No Has patient had a PCN reaction occurring within the last 10 years: No If all of the above answers are "NO", then may proceed with Cephalosporin use.   . Tape Hives    Certain surgical tape - clear surgical tape causes hives     The results of significant diagnostics from this hospitalization (including imaging, microbiology, ancillary and laboratory) are listed below for reference.    Significant Diagnostic Studies: Dg Chest 2 View  Result Date: 07/13/2017 CLINICAL DATA:  Intermittent chest pain since Monday. Occasional shortness of breath and dizziness. EXAM: CHEST - 2 VIEW COMPARISON:  05/05/2016. FINDINGS: Trachea is midline. Heart size is normal. Bibasilar scarring. Lungs are otherwise clear. No pleural fluid. IMPRESSION: No acute findings. Electronically Signed   By: Lorin Picket M.D.   On: 07/13/2017 20:11   Ct Coronary Morph W/cta Cor W/score W/ca W/cm &/or Wo/cm  Addendum Date: 07/14/2017   ADDENDUM REPORT: 07/14/2017 13:51 CLINICAL DATA:  72 year old female with chest pain. EXAM: Cardiac/Coronary  CT TECHNIQUE: The patient was scanned on a [employer]. FINDINGS: A 120 kV prospective scan was triggered in the descending thoracic aorta at 111 HU's. Axial non-contrast 3 mm slices were carried out through the heart. The data set was analyzed on a dedicated work station and scored using the [employer]. Gantry rotation speed was 250 msecs and collimation was .6 mm. No beta blockade and 0.8 mg of sl NTG was given. The 3D data set was reconstructed in 5% intervals of the 67-82 % of the R-R cycle. Diastolic phases were analyzed on a dedicated work station using MPR, MIP and VRT  modes. The patient received 80 cc of contrast. Aorta: Normal size. No calcifications and mild atheroma in the descending aorta. No dissection. Aortic Valve:  Trileaflet.  No calcifications. Coronary Arteries:  Normal coronary origin.  Right dominance. RCA is a large dominant artery that gives rise to PDA and PLA. There is no plaque. Left main is a large artery that gives rise to LAD and LCX arteries. LAD is a large vessel that has no plaque. LCX is a non-dominant artery that gives rise to one large OM1 branch. There is no plaque. Other findings: Normal pulmonary vein drainage into the left atrium. Normal let atrial appendage without a thrombus. Normal size of the pulmonary artery. IMPRESSION: 1. Coronary calcium score of 0. This was 0 percentile for age and sex matched control. 2. Normal coronary origin with right dominance. 3. No evidence of CAD. Electronically Signed   By: Ena Dawley   On: 07/14/2017 13:51   Result Date: 07/14/2017 EXAM: OVER-READ INTERPRETATION  CT CHEST The following report is an  over-read performed by radiologist Dr. Abigail Miyamoto of [facility], [place] on 07/14/2017. This over-read does not include interpretation of cardiac or coronary anatomy or pathology. The coronary CTA interpretation by the cardiologist is attached. COMPARISON:  Chest radiograph 07/13/2017. Complete diagnostic CT of the chest of 07/18/2015. FINDINGS: Vascular: Mildly tortuous thoracic aorta. Aortic atherosclerosis. No central pulmonary embolism, on this non-dedicated study. Mediastinum/Nodes: Prominent but not pathologically enlarged middle mediastinal nodes. No hilar adenopathy. Lungs/Pleura: No pleural fluid. Mildly diminished lung volumes with subsegmental atelectasis in both lower lobes. Upper Abdomen: Lateral segment 1.6 cm cyst. Normal imaged portions of the spleen, stomach, pancreas, adrenal glands, right kidney. Cholecystectomy. Musculoskeletal: Right breast calcification is similar and likely dystrophic  or related to a fibroadenoma. No acute osseous abnormality. Mild midthoracic spondylosis. IMPRESSION: 1.  No acute findings in the imaged extracardiac chest. 2.  Aortic Atherosclerosis (ICD10-I70.0). Electronically Signed: By: Abigail Miyamoto M.D. On: 07/14/2017 13:47    Microbiology: Recent Results (from the past 240 hour(s))  MRSA PCR Screening     Status: None   Collection Time: 07/14/17 12:44 AM  Result Value Ref Range Status   MRSA by PCR NEGATIVE NEGATIVE Final    Comment:        The GeneXpert MRSA Assay (FDA approved for NASAL specimens only), is one component of a comprehensive MRSA colonization surveillance program. It is not intended to diagnose MRSA infection nor to guide or monitor treatment for MRSA infections. Performed at [facility], [place] [address], [place], [place] [address]      Labs: Basic Metabolic Panel: Recent Labs  Lab 07/13/17 1840  NA 137  K 4.3  CL 103  CO2 25  GLUCOSE 123*  BUN 13  CREATININE 1.09*  CALCIUM 9.4   CBC: Recent Labs  Lab 07/13/17 1840  WBC 7.2  HGB 12.5  HCT 40.3  MCV 85.9  PLT 285   Cardiac Enzymes: Recent Labs  Lab 07/13/17 1840 07/14/17 0049 07/14/17 0337  TROPONINI <0.03 <0.03 <0.03    Principal Problem:   Chest pain, rule out acute myocardial infarction Active Problems:   Essential hypertension   Time coordinating discharge: 35 minutes  Signed:  Murray Hodgkins, MD [facility] 07/14/2017, 5:56 PM

## 2017-07-14 NOTE — Plan of Care (Signed)
Continue with plan of care.  

## 2017-07-15 ENCOUNTER — Encounter: Payer: Self-pay | Admitting: Internal Medicine

## 2017-07-15 ENCOUNTER — Telehealth: Payer: Self-pay | Admitting: *Deleted

## 2017-07-15 ENCOUNTER — Ambulatory Visit (INDEPENDENT_AMBULATORY_CARE_PROVIDER_SITE_OTHER): Payer: Medicare Other | Admitting: Internal Medicine

## 2017-07-15 VITALS — BP 130/80 | HR 74 | Temp 97.9°F | Ht 61.0 in | Wt 171.0 lb

## 2017-07-15 DIAGNOSIS — R42 Dizziness and giddiness: Secondary | ICD-10-CM | POA: Diagnosis not present

## 2017-07-15 DIAGNOSIS — R079 Chest pain, unspecified: Secondary | ICD-10-CM | POA: Diagnosis not present

## 2017-07-15 DIAGNOSIS — I10 Essential (primary) hypertension: Secondary | ICD-10-CM | POA: Diagnosis not present

## 2017-07-15 DIAGNOSIS — R300 Dysuria: Secondary | ICD-10-CM

## 2017-07-15 DIAGNOSIS — N3001 Acute cystitis with hematuria: Secondary | ICD-10-CM | POA: Diagnosis not present

## 2017-07-15 LAB — POCT URINALYSIS DIPSTICK
BILIRUBIN UA: NEGATIVE
Glucose, UA: NEGATIVE
KETONES UA: NEGATIVE
Nitrite, UA: NEGATIVE
PH UA: 6 (ref 5.0–8.0)
Protein, UA: NEGATIVE
Spec Grav, UA: 1.025 (ref 1.010–1.025)
UROBILINOGEN UA: 0.2 U/dL

## 2017-07-15 MED ORDER — SULFAMETHOXAZOLE-TRIMETHOPRIM 800-160 MG PO TABS: 1.0000 | ORAL_TABLET | Freq: Two times a day (BID) | ORAL | 0 refills | Status: DC

## 2017-07-15 MED ORDER — AMLODIPINE BESYLATE 5 MG PO TABS: 5.0000 mg | ORAL_TABLET | Freq: Every day | ORAL | 3 refills | Status: DC

## 2017-07-15 NOTE — Progress Notes (Signed)
   Subjective:    Patient ID: Rhonda Henry, female    DOB: 1945-12-29, 72 y.o.   MRN: [id number]  HPI The patient is a 72 YO female coming in for hospital follow up (in for chest pain which was atypical, CT calcium score 0 and no stress testing done, EKG without changes, troponin negative). She left the hospital yesterday and is still having some chest pains. She is overall improving gradually. She is having symptoms of UTI with burning and urgency. She is also having some dizziness. This happens mostly with standing. For some time taking 1/2 pill losartan helped to alleviate the dizziness but it has returned. She tried stopping the medicine but that caused high blood pressure and headaches. She is still having the atypical pressure sensation in her chest and talked to her rabbi and he thinks she should talk to a cardiologist about a stress test.   PMH, [facility], social history reviewed and updated.   Review of Systems  Constitutional: Positive for activity change and appetite change.  HENT: Negative.   Eyes: Negative.   Respiratory: Positive for chest tightness. Negative for cough and shortness of breath.   Cardiovascular: Positive for chest pain. Negative for palpitations and leg swelling.  Gastrointestinal: Negative for abdominal distention, abdominal pain, constipation, diarrhea, nausea and vomiting.  Genitourinary: Positive for dysuria, frequency and urgency.  Musculoskeletal: Negative.   Skin: Negative.   Neurological: Positive for dizziness. Negative for tremors, seizures, syncope, weakness and light-headedness.  Psychiatric/Behavioral: Negative.       Objective:   Physical Exam  Constitutional: She is oriented to person, place, and time. She appears well-developed and well-nourished.  HENT:  Head: Normocephalic and atraumatic.  Eyes: EOM are normal.  Neck: Normal range of motion.  Cardiovascular: Normal rate and regular rhythm.  Pulmonary/Chest: Effort normal and breath sounds  normal. No respiratory distress. She has no wheezes. She has no rales.  Abdominal: Soft. Bowel sounds are normal. She exhibits no distension. There is no tenderness. There is no rebound.  Musculoskeletal: She exhibits no edema.  Neurological: She is alert and oriented to person, place, and time. Coordination normal.  Skin: Skin is warm and dry.  Psychiatric: She has a normal mood and affect.   Vitals:   07/15/17 1413  BP: 130/80  Pulse: 74  Temp: 97.9 F (36.6 C)  TempSrc: Oral  SpO2: 96%  Weight: 171 lb (77.6 kg)  Height: 5\' 1"  (1.549 m)      Assessment & Plan:

## 2017-07-15 NOTE — Assessment & Plan Note (Signed)
Stop losartan and change to amlodipine 5 mg daily.

## 2017-07-15 NOTE — Patient Instructions (Addendum)
We have sent in bactrim to take 1 pill twice a day for the bladder infection.  We will have you stop losartan and we have sent in amlodipine to take 1 pill daily.

## 2017-07-15 NOTE — Assessment & Plan Note (Signed)
BP at goal but due to other symptom of dizziness will change losartan to amlodipine 5 mg daily.

## 2017-07-15 NOTE — Telephone Encounter (Signed)
Called pt to confirm appt that has been made for this afternoon 07/15/17. Completed TCM call below.Johny Chess  Transition Care Management Follow-up Telephone Call   Date discharged? 07/14/17   How have you been since you were released from the hospital? Pt states she seem to be doing ok today   Do you understand why you were in the hospital? YES   Do you understand the discharge instructions? YES   Where were you discharged to? Home   Items Reviewed:  Medications reviewed: YES  Allergies reviewed: YES  Dietary changes reviewed: NO  Referrals reviewed: YES, Pt states she is need MD to get a referral to a cardiolofist   Functional Questionnaire:   Activities of Daily Living (ADLs):   She states she are independent in the following: ambulation, bathing and hygiene, feeding, continence, grooming and toileting States she doesn't require assistance    Any transportation issues/concerns?: NO   Any patient concerns? NO   Confirmed importance and date/time of follow-up visits scheduled: YES, pt had already called and appt was given for today 07/15/17  Provider Appointment booked with Dr. Sharlet Salina  Confirmed with patient if condition begins to worsen call PCP or go to the ER.  Patient was given the office number and encouraged to call back with question or concerns.  : YES

## 2017-07-15 NOTE — Assessment & Plan Note (Signed)
Will change losartan to amlodipine to see if a calcium channel blocker will help the atypical pain. Referral done to cardiology per her request although I did explain that a stress test will likely not be needed or helpful for her.

## 2017-07-15 NOTE — Assessment & Plan Note (Signed)
U/A done in the office consistent with UTI. She does well with bactrim which is prescribed today.

## 2017-07-20 ENCOUNTER — Encounter: Payer: Self-pay | Admitting: Internal Medicine

## 2017-07-20 DIAGNOSIS — R300 Dysuria: Secondary | ICD-10-CM

## 2017-07-21 DIAGNOSIS — M25561 Pain in right knee: Secondary | ICD-10-CM | POA: Diagnosis not present

## 2017-07-21 DIAGNOSIS — M1711 Unilateral primary osteoarthritis, right knee: Secondary | ICD-10-CM | POA: Diagnosis not present

## 2017-07-31 DIAGNOSIS — S40861A Insect bite (nonvenomous) of right upper arm, initial encounter: Secondary | ICD-10-CM | POA: Diagnosis not present

## 2017-07-31 DIAGNOSIS — W57XXXA Bitten or stung by nonvenomous insect and other nonvenomous arthropods, initial encounter: Secondary | ICD-10-CM | POA: Diagnosis not present

## 2017-09-02 ENCOUNTER — Ambulatory Visit: Payer: Medicare Other | Admitting: Cardiovascular Disease

## 2017-09-21 ENCOUNTER — Ambulatory Visit (INDEPENDENT_AMBULATORY_CARE_PROVIDER_SITE_OTHER): Payer: Medicare Other

## 2017-09-21 ENCOUNTER — Ambulatory Visit (INDEPENDENT_AMBULATORY_CARE_PROVIDER_SITE_OTHER): Payer: Medicare Other | Admitting: Orthopaedic Surgery

## 2017-09-21 DIAGNOSIS — M549 Dorsalgia, unspecified: Secondary | ICD-10-CM

## 2017-09-21 DIAGNOSIS — M25512 Pain in left shoulder: Secondary | ICD-10-CM

## 2017-09-21 DIAGNOSIS — M5442 Lumbago with sciatica, left side: Secondary | ICD-10-CM

## 2017-09-21 DIAGNOSIS — G8929 Other chronic pain: Secondary | ICD-10-CM

## 2017-09-21 NOTE — Progress Notes (Signed)
Office Visit Note   Patient: Rhonda Henry           Date of Birth: 24-May-1945           MRN: [id number] Visit Date: 09/21/2017              Requested by: Hoyt Koch, MD [place], [place] [id number] PCP: Hoyt Koch, MD   Assessment & Plan: Visit Diagnoses:  1. Chronic left shoulder pain   2. Mid back pain   3. Acute left-sided low back pain with left-sided sciatica     Plan: I do feel that she would benefit from formal physical therapy on her left trapezius area her thoracic lumbar spine area as well as her left hip with the trochanteric area and IT band.  I like to see her back in 4 weeks to see how she is doing overall.  I would like an AP and lateral of the left knee at that visit as well.  She defers any injections today and will readdress at her next visit as well.  All question concerns were answered and addressed.  Follow-Up Instructions: Return in about 4 weeks (around 10/19/2017).   Orders:  Orders Placed This Encounter  Procedures  . XR Shoulder Left  . XR Lumbar Spine 2-3 Views  . XR Thoracic Spine 2 View   No orders of the defined types were placed in this encounter.     Procedures: No procedures performed   Clinical Data: No additional findings.   Subjective: Chief Complaint  Patient presents with  . Left Leg - Pain  Patient is a very pleasant 72 year old female who comes in with several different chief complaints.  Her more chronic complaints of left shoulder pain but is been in the trapezius and parascapular areas where she hurts.  She felt was may be a rotator cuff issue and is been hurting for a long period of time.  She also has mid back to low back pain and recently started developing left hip pain with sciatica.  She does have a history of a left total knee arthroplasty done 2 years ago and she says never been quite right and she is sought multiple second opinions of all felt that the knee is been fine in terms  of the position of the implants and stable knee.  She even feels like most of this may be IT band related and I agree.  She is someone who is moderately obese.  She tries to stay as active as possible.  She is younger appearing for age.  She takes Aleve on occasion.  She does have a history of peripheral neuropathy but is not diabetic related.  It is of uncertain etiology and does take nortriptyline for this.  HPI  Review of Systems She currently denies any headache, chest pain, shortness of breath, fever, chills, nausea, vomiting.  Objective: Vital Signs: There were no vitals taken for this visit.  Physical Exam She is alert and oriented x3 and in no acute distress Ortho Exam Examination of her left shoulder shows that moves fluidly in terms of the rotator cuff and its relationship the shoulder.  Her pain seems to be only though in the trapezius and parascapular area of the left side not the shoulder itself.  She is got good strength of the rotator cuff itself.  She does have paraspinal muscle pain in the mid thoracic spine going down the lower spine with no midline tenderness but  is mainly the paraspinal muscles.  Her left hip exam is normal in terms of range of motion of the hip and knee but she has significant pain to palpation of the trochanteric area and IT band. Specialty Comments:  No specialty comments available.  Imaging: Xr Thoracic Spine 2 View  Result Date: 09/21/2017 2 views of the thoracic spine show slight scoliosis.  There is degenerative changes in the posterior elements with no evidence of any acute findings.  Xr Lumbar Spine 2-3 Views  Result Date: 09/21/2017 2 views of lumbar spine show degenerative scoliosis with multilevel spondylosis and no acute findings.  Xr Shoulder Left  Result Date: 09/21/2017 3 views of left shoulder show well located shoulder with no acute findings.    PMFS History: Patient Active Problem List   Diagnosis Date Noted  . Chest pain,  rule out acute myocardial infarction 07/13/2017  . Travel advice encounter 12/05/2015  . Lightheadedness 07/18/2015  . SOB (shortness of breath) on exertion 07/18/2015  . Routine general medical examination at a health care facility 06/06/2015  . At high risk for cardiac arrhythmia 02/28/2015  . Numbness and tingling in left arm 02/26/2015  . Constipation 06/12/2014  . Dental disease 11/25/2013  . Obesity 09/02/2013  . Multinodular goiter 02/26/2013  . Liver hemangioma 08/09/2012  . Lumbar herniated disc 05/02/2012  . 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 VITAMIN D DEFICIENCY 05/26/2009  . Essential hypertension 07/30/2006  . UTI (urinary tract infection) 07/30/2006   Past Medical History:  Diagnosis Date  . Abdominal pain, acute, right upper quadrant    had gall bladder removed and now no issues   . Allergy   . Anxiety   . Arthritis   . Bloating   . Complication of anesthesia    "I shake like crazy"  . Constipation    not now  . Depression    no issues currently   . Difficulty sleeping    no issues now  . GERD (gastroesophageal reflux disease)   . Heart murmur    no recent mention of this per pt.   . Hypertension   . Microscopic hematuria     x 30 yrs   . Nausea   . Nerve damage    face -1 yr ago and now almost resolved   . Neuromuscular disorder ([place])   . Recurrent UTI     Family History  Problem Relation Age of Onset  . Thyroid cancer Mother   . Heart disease Mother   . Macular degeneration Mother   . Stroke Mother   . Hypertension Mother   . Glaucoma Father   . Pneumonia Father        passed away with this  . Hypertension Other   . Stomach cancer Paternal Grandmother   . Colon cancer Neg Hx   . Colon polyps Neg Hx   . Rectal cancer Neg Hx   . Pancreatic cancer Neg Hx     Past Surgical History:  Procedure Laterality Date  . breast biopsies    . BREAST LUMPECTOMY     x 2  . c  sections     x2  . CHOLECYSTECTOMY N/A 03/13/2013   Procedure: LAPAROSCOPIC CHOLECYSTECTOMY WITH INTRAOPERATIVE CHOLANGIOGRAM;  Surgeon: [name]. [name], MD;  Location: [facility];  Service: General;  Laterality: N/A;  . COLONOSCOPY    . CYST EXCISION     removed from back and left arm  .  FACIAL COSMETIC SURGERY  7-10   compl by thick scars and a scar necrosis on R cheek  . LUMBAR EPIDURAL INJECTION  Jan 15   . [place]  . TOTAL KNEE ARTHROPLASTY Left 06/17/2015   Procedure: LEFT TOTAL KNEE ARTHROPLASTY;  Surgeon: Paralee Cancel, MD;  Location: [facility];  Service: Orthopedics;  Laterality: Left;  Spinal to General   Social History   Occupational History  . 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 Smoking status: Never Smoker  . Smokeless tobacco: Never Used  Substance and Sexual Activity  . Alcohol use: Yes    Alcohol/week: 0.0 standard drinks    Comment: socially 1-2 glasses weekly  . Drug use: No  . Sexual activity: Yes    Birth control/protection: Other-see comments

## 2017-09-30 DIAGNOSIS — M7062 Trochanteric bursitis, left hip: Secondary | ICD-10-CM | POA: Diagnosis not present

## 2017-09-30 DIAGNOSIS — M545 Low back pain: Secondary | ICD-10-CM | POA: Diagnosis not present

## 2017-09-30 DIAGNOSIS — M25511 Pain in right shoulder: Secondary | ICD-10-CM | POA: Diagnosis not present

## 2017-10-04 DIAGNOSIS — M79642 Pain in left hand: Secondary | ICD-10-CM | POA: Diagnosis not present

## 2017-10-04 DIAGNOSIS — Z79899 Other long term (current) drug therapy: Secondary | ICD-10-CM | POA: Diagnosis not present

## 2017-10-04 DIAGNOSIS — M7062 Trochanteric bursitis, left hip: Secondary | ICD-10-CM | POA: Diagnosis not present

## 2017-10-04 DIAGNOSIS — M79641 Pain in right hand: Secondary | ICD-10-CM | POA: Diagnosis not present

## 2017-10-04 DIAGNOSIS — M25511 Pain in right shoulder: Secondary | ICD-10-CM | POA: Diagnosis not present

## 2017-10-04 DIAGNOSIS — M545 Low back pain: Secondary | ICD-10-CM | POA: Diagnosis not present

## 2017-10-04 DIAGNOSIS — G894 Chronic pain syndrome: Secondary | ICD-10-CM | POA: Diagnosis not present

## 2017-10-06 ENCOUNTER — Ambulatory Visit: Payer: Medicare Other

## 2017-10-07 ENCOUNTER — Ambulatory Visit (INDEPENDENT_AMBULATORY_CARE_PROVIDER_SITE_OTHER): Payer: Medicare Other | Admitting: Internal Medicine

## 2017-10-07 ENCOUNTER — Encounter: Payer: Self-pay | Admitting: Internal Medicine

## 2017-10-07 DIAGNOSIS — R05 Cough: Secondary | ICD-10-CM | POA: Diagnosis not present

## 2017-10-07 DIAGNOSIS — R059 Cough, unspecified: Secondary | ICD-10-CM

## 2017-10-07 MED ORDER — AZITHROMYCIN 250 MG PO TABS: ORAL_TABLET | ORAL | 0 refills | Status: AC

## 2017-10-07 NOTE — Assessment & Plan Note (Signed)
Rx for azithromycin and suspect that allergens are contributing but suspect bronchitis.

## 2017-10-07 NOTE — Patient Instructions (Signed)
We have sent in azithromycin to take 2 pills today, then starting tomorrow 1 pill daily until gone.

## 2017-10-07 NOTE — Progress Notes (Signed)
   Subjective:    Patient ID: Rhonda Henry, female    DOB: 01/14/1945, 72 y.o.   MRN: [id number]  HPI The patient is a 72 YO female coming in for congestion and fatigue for about 1 week. Also cough which is mostly non-productive. She is having chills during this time as well. She denies sputum. Denies SOB. She is not having muscle aches. Tried dayquil and this did not help but make her eyes irritated. Denies significant nasal congestion, has mild ear pressure. Overall worsening.   Review of Systems  Constitutional: Positive for activity change, appetite change and chills. Negative for fatigue, fever and unexpected weight change.  HENT: Positive for congestion, postnasal drip, rhinorrhea and sinus pressure. Negative for ear discharge, ear pain, sinus pain, sneezing, sore throat, tinnitus, trouble swallowing and voice change.   Eyes: Negative.   Respiratory: Positive for cough. Negative for chest tightness, shortness of breath and wheezing.   Cardiovascular: Negative.   Gastrointestinal: Negative.   Musculoskeletal: Positive for myalgias.  Neurological: Negative.       Objective:   Physical Exam  Constitutional: She is oriented to person, place, and time. She appears well-developed and well-nourished.  HENT:  Head: Normocephalic and atraumatic.  Oropharynx with redness and clear drainage, nose with swollen turbinates, TMs normal bilaterally  Eyes: EOM are normal.  Neck: Normal range of motion. No thyromegaly present.  Cardiovascular: Normal rate and regular rhythm.  Pulmonary/Chest: Effort normal and breath sounds normal. No respiratory distress. She has no wheezes. She has no rales.  Abdominal: Soft.  Musculoskeletal: She exhibits tenderness.  Lymphadenopathy:    She has no cervical adenopathy.  Neurological: She is alert and oriented to person, place, and time.  Skin: Skin is warm and dry.   Vitals:   10/07/17 1456  BP: 110/80  Pulse: 85  Temp: 98.3 F (36.8 C)  TempSrc:  Oral  SpO2: 97%  Weight: 169 lb (76.7 kg)  Height: 5\' 1"  (1.549 m)      Assessment & Plan:

## 2017-10-10 ENCOUNTER — Ambulatory Visit: Payer: Medicare Other

## 2017-10-10 DIAGNOSIS — M25511 Pain in right shoulder: Secondary | ICD-10-CM | POA: Diagnosis not present

## 2017-10-10 DIAGNOSIS — M545 Low back pain: Secondary | ICD-10-CM | POA: Diagnosis not present

## 2017-10-10 DIAGNOSIS — M7062 Trochanteric bursitis, left hip: Secondary | ICD-10-CM | POA: Diagnosis not present

## 2017-10-13 DIAGNOSIS — M7062 Trochanteric bursitis, left hip: Secondary | ICD-10-CM | POA: Diagnosis not present

## 2017-10-13 DIAGNOSIS — M545 Low back pain: Secondary | ICD-10-CM | POA: Diagnosis not present

## 2017-10-13 DIAGNOSIS — M25511 Pain in right shoulder: Secondary | ICD-10-CM | POA: Diagnosis not present

## 2017-10-16 ENCOUNTER — Encounter: Payer: Self-pay | Admitting: Internal Medicine

## 2017-10-18 DIAGNOSIS — M25511 Pain in right shoulder: Secondary | ICD-10-CM | POA: Diagnosis not present

## 2017-10-18 DIAGNOSIS — M7062 Trochanteric bursitis, left hip: Secondary | ICD-10-CM | POA: Diagnosis not present

## 2017-10-18 DIAGNOSIS — M545 Low back pain: Secondary | ICD-10-CM | POA: Diagnosis not present

## 2017-10-19 ENCOUNTER — Ambulatory Visit (INDEPENDENT_AMBULATORY_CARE_PROVIDER_SITE_OTHER): Payer: Medicare Other | Admitting: Orthopaedic Surgery

## 2017-10-21 DIAGNOSIS — M7062 Trochanteric bursitis, left hip: Secondary | ICD-10-CM | POA: Diagnosis not present

## 2017-10-21 DIAGNOSIS — M25511 Pain in right shoulder: Secondary | ICD-10-CM | POA: Diagnosis not present

## 2017-10-21 DIAGNOSIS — M545 Low back pain: Secondary | ICD-10-CM | POA: Diagnosis not present

## 2017-10-24 DIAGNOSIS — M25511 Pain in right shoulder: Secondary | ICD-10-CM | POA: Diagnosis not present

## 2017-10-24 DIAGNOSIS — M7062 Trochanteric bursitis, left hip: Secondary | ICD-10-CM | POA: Diagnosis not present

## 2017-10-24 DIAGNOSIS — M545 Low back pain: Secondary | ICD-10-CM | POA: Diagnosis not present

## 2017-10-28 DIAGNOSIS — M7062 Trochanteric bursitis, left hip: Secondary | ICD-10-CM | POA: Diagnosis not present

## 2017-10-28 DIAGNOSIS — M25511 Pain in right shoulder: Secondary | ICD-10-CM | POA: Diagnosis not present

## 2017-10-28 DIAGNOSIS — M545 Low back pain: Secondary | ICD-10-CM | POA: Diagnosis not present

## 2017-11-02 ENCOUNTER — Ambulatory Visit (INDEPENDENT_AMBULATORY_CARE_PROVIDER_SITE_OTHER): Payer: Medicare Other | Admitting: Orthopaedic Surgery

## 2017-11-02 ENCOUNTER — Encounter (INDEPENDENT_AMBULATORY_CARE_PROVIDER_SITE_OTHER): Payer: Self-pay | Admitting: Orthopaedic Surgery

## 2017-11-02 DIAGNOSIS — M25512 Pain in left shoulder: Secondary | ICD-10-CM | POA: Diagnosis not present

## 2017-11-02 DIAGNOSIS — G8929 Other chronic pain: Secondary | ICD-10-CM | POA: Diagnosis not present

## 2017-11-02 MED ORDER — METHYLPREDNISOLONE ACETATE 40 MG/ML IJ SUSP: 40.0000 mg | INTRAMUSCULAR | Status: DC | PRN

## 2017-11-02 MED ORDER — LIDOCAINE HCL 1 % IJ SOLN: 3.0000 mL | INTRAMUSCULAR | Status: DC | PRN

## 2017-11-02 NOTE — Progress Notes (Addendum)
Office Visit Note   Patient: Rhonda Henry           Date of Birth: 1945-09-20           MRN: [id number] Visit Date: 11/02/2017              Requested by: Rhonda Koch, MD [place], [place] [id number] PCP: Rhonda Koch, MD   Assessment & Plan: Visit Diagnoses:  1. Chronic left shoulder pain     Plan:   We decided to place a steroid injection in the left trapezius/parascapular area of the shoulder where she was more painful in terms of trigger point pain and not in the subacromial area.  She tolerated this very well and this did give her some pain relief from just the lidocaine.  All question concerns were answered and addressed.  Follow-up will be as needed.  Follow-Up Instructions: Return if symptoms worsen or fail to improve.   Orders:  Orders Placed This Encounter  Procedures  . Large Joint Inj: L subacromial bursa   No orders of the defined types were placed in this encounter.     Procedures: Trigger Point Inj Date/Time: 11/02/2017 10:39 AM Performed by: Rhonda Pelt, PA-C Authorized by: Rhonda Pelt, PA-C   Consent Given by:  Patient Site marked: the procedure site was marked   Timeout: prior to procedure the correct patient, procedure, and site was verified   Indications:  Pain and therapeutic Total # of Trigger Points:  1 Location: shoulder   Needle Size:  27 G Medications #1:  40 mg methylPREDNISolone acetate 40 MG/ML; 0.5 mL lidocaine 1 % Patient tolerance:  Patient tolerated the procedure well with no immediate complications     Clinical Data: No additional findings.   Subjective: Chief Complaint  Patient presents with  . Left Shoulder - Pain    HPI Rhonda Henry 72 year old female comes in today with right shoulder pain.  She has chronic right shoulder pain since September.  She is been to formal physical therapy states is helped some but still having pain in her shoulder and requesting cortisone  injection today.  In regards to her left knee which was discussed at her September visit with Rhonda Henry she states that the knee is doing well does not want to address it today.  She actually points to the parascapular area in the trapezius area as a source of her pain and not in the shoulder joint itself.  She is had dry needling to this area which was not too helpful. Review of Systems See HPI otherwise negative or noncontributory  Objective: Vital Signs: There were no vitals taken for this visit.  Physical Exam  Constitutional: She is oriented to person, place, and time. She appears well-developed and well-nourished. No distress.  Pulmonary/Chest: Effort normal.  Neurological: She is alert and oriented to person, place, and time.  Skin: She is not diaphoretic.  Psychiatric: She has a normal mood and affect.    Ortho Exam Left shoulder she has full forward flexion good internal/external rotation.  5 out of 5 strength with external and internal rotation bilateral shoulders against resistance.  Positive liftoff on the left only.  Positive impingement left shoulder.  When I examined her shoulder I agree with the above exam however her pain seems to be in the trapezius area and she has a specific area where she is most tender and it is definitely a trigger point. Specialty Comments:  No specialty  comments available.  Imaging: No results found.   PMFS History: Patient Active Problem List   Diagnosis Date Noted  . Chest pain, rule out acute myocardial infarction 07/13/2017  . Cough 05/05/2016  . Lightheadedness 07/18/2015  . SOB (shortness of breath) on exertion 07/18/2015  . Routine general medical examination at a health care facility 06/06/2015  . At high risk for cardiac arrhythmia 02/28/2015  . Numbness and tingling in left arm 02/26/2015  . Constipation 06/12/2014  . Dental disease 11/25/2013  . Obesity 09/02/2013  . Multinodular goiter 02/26/2013  . Liver hemangioma  08/09/2012  . Lumbar herniated disc 05/02/2012  . 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 VITAMIN D DEFICIENCY 05/26/2009  . Essential hypertension 07/30/2006   Past Medical History:  Diagnosis Date  . Abdominal pain, acute, right upper quadrant    had gall bladder removed and now no issues   . Allergy   . Anxiety   . Arthritis   . Bloating   . Complication of anesthesia    "I shake like crazy"  . Constipation    not now  . Depression    no issues currently   . Difficulty sleeping    no issues now  . GERD (gastroesophageal reflux disease)   . Heart murmur    no recent mention of this per pt.   . Hypertension   . Microscopic hematuria     x 30 yrs   . Nausea   . Nerve damage    face -1 yr ago and now almost resolved   . Neuromuscular disorder ([place])   . Recurrent UTI     Family History  Problem Relation Age of Onset  . Thyroid cancer Mother   . Heart disease Mother   . Macular degeneration Mother   . Stroke Mother   . Hypertension Mother   . Glaucoma Father   . Pneumonia Father        passed away with this  . Hypertension Other   . Stomach cancer Paternal Grandmother   . Colon cancer Neg Hx   . Colon polyps Neg Hx   . Rectal cancer Neg Hx   . Pancreatic cancer Neg Hx     Past Surgical History:  Procedure Laterality Date  . breast biopsies    . BREAST LUMPECTOMY     x 2  . c sections     x2  . CHOLECYSTECTOMY N/A 03/13/2013   Procedure: LAPAROSCOPIC CHOLECYSTECTOMY WITH INTRAOPERATIVE CHOLANGIOGRAM;  Surgeon: [name]. [name], MD;  Location: [facility];  Service: General;  Laterality: N/A;  . COLONOSCOPY    . CYST EXCISION     removed from back and left arm  . FACIAL COSMETIC SURGERY  7-10   compl by thick scars and a scar necrosis on R cheek  . LUMBAR EPIDURAL INJECTION  Jan 15   . [place]  . TOTAL KNEE ARTHROPLASTY Left 06/17/2015   Procedure: LEFT TOTAL KNEE ARTHROPLASTY;   Surgeon: Paralee Cancel, MD;  Location: [facility];  Service: Orthopedics;  Laterality: Left;  Spinal to General   Social History   Occupational History  . 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 Smoking status: Never Smoker  . Smokeless tobacco: Never Used  Substance and Sexual Activity  . Alcohol use: Yes    Alcohol/week: 0.0 standard drinks    Comment: socially 1-2  glasses weekly  . Drug use: No  . Sexual activity: Yes    Birth control/protection: Other-see comments

## 2017-11-07 DIAGNOSIS — Z23 Encounter for immunization: Secondary | ICD-10-CM | POA: Diagnosis not present

## 2017-11-08 DIAGNOSIS — M25511 Pain in right shoulder: Secondary | ICD-10-CM | POA: Diagnosis not present

## 2017-11-08 DIAGNOSIS — M7062 Trochanteric bursitis, left hip: Secondary | ICD-10-CM | POA: Diagnosis not present

## 2017-11-08 DIAGNOSIS — M545 Low back pain: Secondary | ICD-10-CM | POA: Diagnosis not present

## 2017-11-11 DIAGNOSIS — M7062 Trochanteric bursitis, left hip: Secondary | ICD-10-CM | POA: Diagnosis not present

## 2017-11-11 DIAGNOSIS — M545 Low back pain: Secondary | ICD-10-CM | POA: Diagnosis not present

## 2017-11-11 DIAGNOSIS — M25511 Pain in right shoulder: Secondary | ICD-10-CM | POA: Diagnosis not present

## 2017-11-15 ENCOUNTER — Ambulatory Visit (INDEPENDENT_AMBULATORY_CARE_PROVIDER_SITE_OTHER): Payer: Medicare Other | Admitting: Gastroenterology

## 2017-11-15 ENCOUNTER — Encounter: Payer: Self-pay | Admitting: Gastroenterology

## 2017-11-15 DIAGNOSIS — K5900 Constipation, unspecified: Secondary | ICD-10-CM

## 2017-11-15 MED ORDER — LINACLOTIDE 72 MCG PO CAPS
72.0000 ug | ORAL_CAPSULE | Freq: Every day | ORAL | 11 refills | Status: DC
Start: 2017-11-15 — End: 2018-11-01

## 2017-11-15 NOTE — Patient Instructions (Addendum)
Refill linzess 72mg  pills, one pill once daily, disp 30 with 11 refills.  We have sent the following medications to your pharmacy for you to pick up at your convenience:  Thank you for entrusting me with your care and choosing [facility].  Dr Ardis Hughs

## 2017-11-15 NOTE — Progress Notes (Signed)
Review of pertinent gastrointestinal problems: 1. Routine risk for colon cancer: Colonoscopy Dr. Ardis Hughs 11/2015 found no polyps. Colonoscopy Dr. Earlean Shawl 2006 also found no polyps. 2. Hemorrhoids and left colon diverticulosis noted on colonoscopy 2017. 3. Constipation: trial of linzess 2016 under care of Dr. Deatra Ina.   HPI: This is a very pleasant 72 year old woman whom I last saw about 2 years ago  She has mild chronic constipation in her Linzess 72 mcg dose works very well for her.  She takes it most days of the week.  Sometimes she does not take it because it works so quickly that on days she is very busy and out and about she is worried about having an accident.  On those days she instead takes a Metamucil wafer.  She has intentionally lost about 20 pounds in the past year or so.  She has no overt GI bleeding and no significant abdominal pains.    Chief complaint is chronic constipation  ROS: complete GI ROS as described in HPI, all other review negative.  Constitutional:  No unintentional weight loss   Past Medical History:  Diagnosis Date  . Abdominal pain, acute, right upper quadrant    had gall bladder removed and now no issues   . Allergy   . Anxiety   . Arthritis   . Bloating   . Complication of anesthesia    "I shake like crazy"  . Constipation    not now  . Depression    no issues currently   . Difficulty sleeping    no issues now  . GERD (gastroesophageal reflux disease)   . Heart murmur    no recent mention of this per pt.   . Hypertension   . Microscopic hematuria     x 30 yrs   . Nausea   . Nerve damage    face -1 yr ago and now almost resolved   . Neuromuscular disorder ([place])   . Recurrent UTI     Past Surgical History:  Procedure Laterality Date  . breast biopsies    . BREAST LUMPECTOMY     x 2  . c sections     x2  . CHOLECYSTECTOMY N/A 03/13/2013   Procedure: LAPAROSCOPIC CHOLECYSTECTOMY WITH INTRAOPERATIVE CHOLANGIOGRAM;  Surgeon: [name].  [name], MD;  Location: [facility];  Service: General;  Laterality: N/A;  . COLONOSCOPY    . CYST EXCISION     removed from back and left arm  . FACIAL COSMETIC SURGERY  7-10   compl by thick scars and a scar necrosis on R cheek  . LUMBAR EPIDURAL INJECTION  Jan 15   . [place]  . 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 Alpha-Lipoic Acid 600 MG CAPS Take 1 capsule by mouth daily.    Marland Kitchen amLODipine (NORVASC) 5 MG tablet Take 1 tablet (5 mg total) by mouth daily. 90 tablet 3  . esomeprazole (NEXIUM) 20 MG capsule Take 20 mg by mouth daily.     Marland Kitchen linaclotide (LINZESS) 72 MCG capsule Take 1 capsule (72 mcg total) by mouth daily before breakfast. 30 capsule 11  . nortriptyline (PAMELOR) 25 MG capsule Take 30 mg by mouth at bedtime.   3   No current facility-administered medications for this visit.  Allergies as of 11/15/2017 - Review Complete 11/15/2017  Allergen Reaction Noted  . Ciprofloxacin Other (See Comments) 02/23/2017  . Other Hives and Other (See Comments) 05/10/2013  . Desipramine hcl Rash   . Penicillins Hives and Rash   . Tape Hives 03/30/2013    Family History  Problem Relation Age of Onset  . Thyroid cancer Mother   . Heart disease Mother   . Macular degeneration Mother   . Stroke Mother   . Hypertension Mother   . Glaucoma Father   . Pneumonia Father        passed away with this  . Hypertension Other   . Stomach cancer Paternal Grandmother   . Colon cancer Neg Hx   . Colon polyps Neg Hx   . Rectal cancer Neg Hx   . Pancreatic cancer Neg Hx     Social History   Socioeconomic History  . Marital status: Married    Spouse name: Herbie [place]  . Number of children: 2  . Years of education: 32  . Highest education level: Not on file   Occupational History  . 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 Financial resource strain: Not on file  . Food insecurity:    Worry: Not on file    Inability: Not on file  . Transportation needs:    Medical: Not on file    Non-medical: Not on file  Tobacco Use  . Smoking status: Never Smoker  . Smokeless tobacco: Never Used  Substance and Sexual Activity  . Alcohol use: Yes    Alcohol/week: 0.0 standard drinks    Comment: socially 1-2 glasses weekly  . Drug use: No  . Sexual activity: Yes    Birth control/protection: Other-see comments  Lifestyle  . Physical activity:    Days per week: Not on file    Minutes per session: Not on file  . Stress: Not on file  Relationships  . Social connections:    Talks on phone: Not on file    Gets together: Not on file    Attends religious service: Not on file    Active member of club or organization: Not on file    Attends meetings of clubs or organizations: Not on file    Relationship status: Not on file  . Intimate partner violence:    Fear of current or ex partner: Not on file    Emotionally abused: Not on file    Physically abused: Not on file    Forced sexual activity: Not on file  Other Topics Concern  . Not on file  Social History Narrative   Lives with husband in a 2 story home.  Has 2 grown children.  Owns her own business.  Education: Forensic psychologist.      Physical Exam: BP 118/70   Pulse 86   Ht 5\' 1"  (1.549 m)   Wt 174 lb (78.9 kg)   BMI 32.88 kg/m  Constitutional: generally well-appearing Psychiatric: alert and oriented x3 Abdomen: soft, nontender, nondistended, no obvious ascites, no peritoneal signs, normal bowel sounds No peripheral edema noted in lower extremities  Assessment and plan: 72 y.o. female with chronic constipation  I am happy to refill her Linzess at current dose.  I will refill this again in 1 year if needed.  She knows  that if she still requires the prescription medicine 2 years from now that I would like to see her in  the office for follow-up.  Please see the "Patient Instructions" section for addition details about the plan.  Owens Loffler, MD [facility] 11/15/2017, 11:07 AM

## 2017-11-24 ENCOUNTER — Other Ambulatory Visit: Payer: Self-pay | Admitting: Internal Medicine

## 2017-11-24 DIAGNOSIS — Z1231 Encounter for screening mammogram for malignant neoplasm of breast: Secondary | ICD-10-CM

## 2017-12-08 DIAGNOSIS — Z86018 Personal history of other benign neoplasm: Secondary | ICD-10-CM | POA: Diagnosis not present

## 2017-12-08 DIAGNOSIS — L820 Inflamed seborrheic keratosis: Secondary | ICD-10-CM | POA: Diagnosis not present

## 2017-12-08 DIAGNOSIS — Z23 Encounter for immunization: Secondary | ICD-10-CM | POA: Diagnosis not present

## 2017-12-08 DIAGNOSIS — D2371 Other benign neoplasm of skin of right lower limb, including hip: Secondary | ICD-10-CM | POA: Diagnosis not present

## 2018-01-02 DIAGNOSIS — M79642 Pain in left hand: Secondary | ICD-10-CM | POA: Diagnosis not present

## 2018-01-02 DIAGNOSIS — G894 Chronic pain syndrome: Secondary | ICD-10-CM | POA: Diagnosis not present

## 2018-01-02 DIAGNOSIS — Z79899 Other long term (current) drug therapy: Secondary | ICD-10-CM | POA: Diagnosis not present

## 2018-01-02 DIAGNOSIS — G500 Trigeminal neuralgia: Secondary | ICD-10-CM | POA: Diagnosis not present

## 2018-01-10 ENCOUNTER — Ambulatory Visit: Payer: Medicare Other

## 2018-01-10 MED ORDER — METHYLPREDNISOLONE ACETATE 40 MG/ML IJ SUSP
40.0000 mg | INTRAMUSCULAR | Status: AC | PRN
  Administered 2017-11-02: 40 mg via INTRAMUSCULAR

## 2018-01-10 MED ORDER — LIDOCAINE HCL 1 % IJ SOLN
0.5000 mL | INTRAMUSCULAR | Status: AC | PRN
  Administered 2017-11-02: .5 mL

## 2018-01-10 NOTE — Addendum Note (Signed)
Addended by: Erskine Emery on: 01/10/2018 10:40 AM   Modules accepted: Orders

## 2018-01-11 ENCOUNTER — Ambulatory Visit (INDEPENDENT_AMBULATORY_CARE_PROVIDER_SITE_OTHER): Payer: Self-pay

## 2018-01-11 ENCOUNTER — Encounter (INDEPENDENT_AMBULATORY_CARE_PROVIDER_SITE_OTHER): Payer: Self-pay | Admitting: Orthopedic Surgery

## 2018-01-11 ENCOUNTER — Ambulatory Visit (INDEPENDENT_AMBULATORY_CARE_PROVIDER_SITE_OTHER): Payer: Medicare Other | Admitting: Orthopedic Surgery

## 2018-01-11 ENCOUNTER — Telehealth (INDEPENDENT_AMBULATORY_CARE_PROVIDER_SITE_OTHER): Payer: Self-pay | Admitting: *Deleted

## 2018-01-11 DIAGNOSIS — M25562 Pain in left knee: Secondary | ICD-10-CM

## 2018-01-11 DIAGNOSIS — M1711 Unilateral primary osteoarthritis, right knee: Secondary | ICD-10-CM | POA: Diagnosis not present

## 2018-01-11 DIAGNOSIS — M25561 Pain in right knee: Secondary | ICD-10-CM

## 2018-01-11 NOTE — Telephone Encounter (Signed)
Noted  

## 2018-01-11 NOTE — Progress Notes (Signed)
Office Visit Note   Patient: Rhonda Henry           Date of Birth: August 20, 1945           MRN: [id number] Visit Date: 01/11/2018 Requested by: Hoyt Koch, MD [place], [place] [id number] PCP: Hoyt Koch, MD  Subjective: Chief Complaint  Patient presents with  . Right Knee - Pain  . Left Knee - Pain    HPI: [name] is a patient with bilateral knee pain.  She had a left total knee replacement done 2018.  She states that the knee is never "felt right".  She is done Aleve and exercise.  She localizes the pain distinctly at the anterior lateral joint line.  She feels like something is rubbing in that area.  She also reports pain in the right knee.  She had a gel injection in the right knee in June and that was exceedingly painful for her.  She presents here now for further management of her orthopedic problems.              ROS: All systems reviewed are negative as they relate to the chief complaint within the history of present illness.  Patient denies  fevers or chills.   Assessment & Plan: Visit Diagnoses:  1. Left knee pain, unspecified chronicity   2. Right knee pain, unspecified chronicity   3. Unilateral primary osteoarthritis, right knee     Plan: Impression is well aligned and functional left total knee replacement with some lateral joint line pain.  On the AP view there may be either bone fragment or cement that could be at the level of the joint line.  This is not seen on the lateral view.  Irregardless I do not think there is any intervention to be done regarding that left knee which moves well and is well aligned and looks good on x-ray.  On the right-hand side the patient does have lateral compartment arthritis.  I think it would be reasonable to try Synvisc 1 gel injection into the right knee to see if that can help with her symptoms.  No effusion today.  Follow-up in couple of weeks after we get that approved for injection.. Follow-Up  Instructions: No follow-ups on file.   Orders:  Orders Placed This Encounter  Procedures  . XR Knee 1-2 Views Right  . XR Knee 1-2 Views Left   No orders of the defined types were placed in this encounter.     Procedures: No procedures performed   Clinical Data: No additional findings.  Objective: Vital Signs: There were no vitals taken for this visit.  Physical Exam:   Constitutional: Patient appears well-developed HEENT:  Head: Normocephalic Eyes:EOM are normal Neck: Normal range of motion Cardiovascular: Normal rate Pulmonary/chest: Effort normal Neurologic: Patient is alert Skin: Skin is warm Psychiatric: Patient has normal mood and affect    Ortho Exam: Ortho exam on that left knee demonstrates anterior lateral joint line tenderness with a little feeling of crepitus in that area.  No real fluid warmth or effusion in the knee.  Extensor mechanism is intact.  Alignment looks good.  Patient has full extension and flexion easily to 110.  No mid flexion instability is present.  On the right-hand side the patient also has full extension and no effusion.  Flexion is easily past 90.  Does have lateral greater than medial joint line tenderness.  Specialty Comments:  No specialty comments available.  Imaging: Xr  Knee 1-2 Views Left  Result Date: 01/11/2018 AP lateral merchant left knee reviewed.  Cemented posterior cruciate sacrificing total knee replacement in good position alignment with no evidence of loosening or hardware failure.  Cement mantle intact.  No fracture.  Xr Knee 1-2 Views Right  Result Date: 01/11/2018 AP lateral merchant right knee reviewed.  Significant osteoarthritis is noted in the lateral compartment.  Sclerosis and joint space narrowing and spurring is noted in this compartment.  Medial compartment appears relatively spared.  Mild to moderate degenerative changes present in the patellofemoral compartment.    PMFS History: Patient Active Problem  List   Diagnosis Date Noted  . Chest pain, rule out acute myocardial infarction 07/13/2017  . Cough 05/05/2016  . Lightheadedness 07/18/2015  . SOB (shortness of breath) on exertion 07/18/2015  . Routine general medical examination at a health care facility 06/06/2015  . At high risk for cardiac arrhythmia 02/28/2015  . Numbness and tingling in left arm 02/26/2015  . Constipation 06/12/2014  . Dental disease 11/25/2013  . Obesity 09/02/2013  . Multinodular goiter 02/26/2013  . Liver hemangioma 08/09/2012  . Lumbar herniated disc 05/02/2012  . 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 VITAMIN D DEFICIENCY 05/26/2009  . Essential hypertension 07/30/2006   Past Medical History:  Diagnosis Date  . Abdominal pain, acute, right upper quadrant    had gall bladder removed and now no issues   . Allergy   . Anxiety   . Arthritis   . Bloating   . Complication of anesthesia    "I shake like crazy"  . Constipation    not now  . Depression    no issues currently   . Difficulty sleeping    no issues now  . GERD (gastroesophageal reflux disease)   . Heart murmur    no recent mention of this per pt.   . Hypertension   . Microscopic hematuria     x 30 yrs   . Nausea   . Nerve damage    face -1 yr ago and now almost resolved   . Neuromuscular disorder ([place])   . Recurrent UTI     Family History  Problem Relation Age of Onset  . Thyroid cancer Mother   . Heart disease Mother   . Macular degeneration Mother   . Stroke Mother   . Hypertension Mother   . Glaucoma Father   . Pneumonia Father        passed away with this  . Hypertension Other   . Stomach cancer Paternal Grandmother   . Colon cancer Neg Hx   . Colon polyps Neg Hx   . Rectal cancer Neg Hx   . Pancreatic cancer Neg Hx     Past Surgical History:  Procedure Laterality Date  . breast biopsies    . BREAST LUMPECTOMY     x 2  . c sections     x2  .  CHOLECYSTECTOMY N/A 03/13/2013   Procedure: LAPAROSCOPIC CHOLECYSTECTOMY WITH INTRAOPERATIVE CHOLANGIOGRAM;  Surgeon: [name]. [name], MD;  Location: [facility];  Service: General;  Laterality: N/A;  . COLONOSCOPY    . CYST EXCISION     removed from back and left arm  . FACIAL COSMETIC SURGERY  7-10   compl by thick scars and a scar necrosis on R cheek  . LUMBAR EPIDURAL INJECTION  Jan 15   . [place]  . TOTAL KNEE ARTHROPLASTY Left  06/17/2015   Procedure: LEFT TOTAL KNEE ARTHROPLASTY;  Surgeon: Paralee Cancel, MD;  Location: [facility];  Service: Orthopedics;  Laterality: Left;  Spinal to General   Social History   Occupational History  . 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 Smoking status: Never Smoker  . Smokeless tobacco: Never Used  Substance and Sexual Activity  . Alcohol use: Yes    Alcohol/week: 0.0 standard drinks    Comment: socially 1-2 glasses weekly  . Drug use: No  . Sexual activity: Yes    Birth control/protection: Other-see comments

## 2018-01-11 NOTE — Telephone Encounter (Signed)
Apply for Gel injection.

## 2018-01-17 ENCOUNTER — Encounter (INDEPENDENT_AMBULATORY_CARE_PROVIDER_SITE_OTHER): Payer: Self-pay | Admitting: Orthopedic Surgery

## 2018-01-23 ENCOUNTER — Telehealth (INDEPENDENT_AMBULATORY_CARE_PROVIDER_SITE_OTHER): Payer: Self-pay

## 2018-01-23 NOTE — Telephone Encounter (Signed)
Submitted VOB for SynviscOne, right knee. 

## 2018-01-24 ENCOUNTER — Telehealth (INDEPENDENT_AMBULATORY_CARE_PROVIDER_SITE_OTHER): Payer: Self-pay

## 2018-01-24 NOTE — Telephone Encounter (Signed)
Talked with patient and advised her that she is approved for gel injection.  Approved for SynviscOne, right knee. Buy & Bill Covered at 100% through second insurance after Medicare pays. No Co-pay No PA required  Appt. 01/25/2018 with Dr. Marlou Sa

## 2018-01-25 ENCOUNTER — Ambulatory Visit (INDEPENDENT_AMBULATORY_CARE_PROVIDER_SITE_OTHER): Payer: Medicare Other | Admitting: Orthopedic Surgery

## 2018-01-25 ENCOUNTER — Encounter (INDEPENDENT_AMBULATORY_CARE_PROVIDER_SITE_OTHER): Payer: Self-pay | Admitting: Orthopedic Surgery

## 2018-01-25 VITALS — Ht 61.0 in | Wt 174.0 lb

## 2018-01-25 DIAGNOSIS — M1711 Unilateral primary osteoarthritis, right knee: Secondary | ICD-10-CM | POA: Diagnosis not present

## 2018-01-26 ENCOUNTER — Telehealth (INDEPENDENT_AMBULATORY_CARE_PROVIDER_SITE_OTHER): Payer: Self-pay

## 2018-01-26 NOTE — Telephone Encounter (Signed)
Patient had appointment for gel injection on 01/25/2018 with Dr. Marlou Sa.

## 2018-01-27 ENCOUNTER — Encounter: Payer: Self-pay | Admitting: Emergency Medicine

## 2018-01-27 ENCOUNTER — Ambulatory Visit: Payer: Medicare Other | Admitting: Family

## 2018-01-27 ENCOUNTER — Ambulatory Visit
Admission: EM | Admit: 2018-01-27 | Discharge: 2018-01-27 | Disposition: A | Discharge status: Home / Self Care | Payer: Medicare Other

## 2018-01-27 DIAGNOSIS — W260XXA Contact with knife, initial encounter: Secondary | ICD-10-CM

## 2018-01-27 DIAGNOSIS — S61212A Laceration without foreign body of right middle finger without damage to nail, initial encounter: Secondary | ICD-10-CM

## 2018-01-27 NOTE — ED Notes (Signed)
Patient able to ambulate independently  

## 2018-01-27 NOTE — Discharge Instructions (Signed)
°  Keep the wound clean and dry. Use a clean towel to pat the wound dry after bathing and/or washing your hands. Do not rub or scrub the wound. Apply a thin layer of antibiotic ointment daily to wound.  Cover the incision with a clean dressing every day Check your wound area every day for signs of infection. Check for: Redness, swelling, or pain. Fluid or blood. Warmth. Pus or a bad smell.

## 2018-01-27 NOTE — ED Provider Notes (Addendum)
[facility]    CSN: [id number] Arrival date & time: 01/27/18  1257     History   Chief Complaint Chief Complaint  Patient presents with  . Laceration    HPI Rhonda Henry is a 73 y.o. female.    Subjective:  Rhonda Henry is a 73 y.o. female who presents for evaluation of a laceration to the right middle finger. Injury occurred around 4 pm on the evening of 01/26/18. The mechanism of the wound was a clean knife. Patient accidentally cut her finger while cutting an apple. The patient reports no coldness, no numbness and no paresthesias in the affected area. There were no other injuries. The tetanus status is up to date.  Patient cleaned the wound thoroughly and applied bacitracin at the time of injury.  The following portions of the patient's history were reviewed and updated as appropriate: allergies, current medications, past family history, past medical history, past social history, past surgical history and problem list.       Past Medical History:  Diagnosis Date  . Abdominal pain, acute, right upper quadrant    had gall bladder removed and now no issues   . Allergy   . Anxiety   . Arthritis   . Bloating   . Complication of anesthesia    "I shake like crazy"  . Constipation    not now  . Depression    no issues currently   . Difficulty sleeping    no issues now  . GERD (gastroesophageal reflux disease)   . Heart murmur    no recent mention of this per pt.   . Hypertension   . Microscopic hematuria     x 30 yrs   . Nausea   . Nerve damage    face -1 yr ago and now almost resolved   . Neuromuscular disorder ([place])   . Recurrent UTI     Patient Active Problem List   Diagnosis Date Noted  . Chest pain, rule out acute myocardial infarction 07/13/2017  . Cough 05/05/2016  . Lightheadedness 07/18/2015  . SOB (shortness of breath) on exertion 07/18/2015  . Routine general medical examination at a health care facility 06/06/2015  . At  high risk for cardiac arrhythmia 02/28/2015  . Numbness and tingling in left arm 02/26/2015  . Constipation 06/12/2014  . Dental disease 11/25/2013  . Obesity 09/02/2013  . Multinodular goiter 02/26/2013  . Liver hemangioma 08/09/2012  . Lumbar herniated disc 05/02/2012  . 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 VITAMIN D DEFICIENCY 05/26/2009  . Essential hypertension 07/30/2006    Past Surgical History:  Procedure Laterality Date  . breast biopsies    . BREAST LUMPECTOMY     x 2  . c sections     x2  . CHOLECYSTECTOMY N/A 03/13/2013   Procedure: LAPAROSCOPIC CHOLECYSTECTOMY WITH INTRAOPERATIVE CHOLANGIOGRAM;  Surgeon: [name]. [name], MD;  Location: [facility];  Service: General;  Laterality: N/A;  . COLONOSCOPY    . CYST EXCISION     removed from back and left arm  . FACIAL COSMETIC SURGERY  7-10   compl by thick scars and a scar necrosis on R cheek  . LUMBAR EPIDURAL INJECTION  Jan 15   . [place]  . TOTAL KNEE ARTHROPLASTY Left 06/17/2015   Procedure: LEFT TOTAL KNEE ARTHROPLASTY;  Surgeon: Paralee Cancel, MD;  Location: [facility];  Service: Orthopedics;  Laterality: Left;  Spinal to General    OB History    Gravida  3   Para  2   Term      Preterm      AB  1   Living        SAB      TAB      Ectopic      Multiple      Live Births               Home Medications    Prior to Admission medications   Medication Sig Start Date End Date Taking? Authorizing Provider  Alpha-Lipoic Acid 600 MG CAPS Take 1 capsule by mouth daily.   Yes [provider]  amLODipine (NORVASC) 5 MG tablet Take 1 tablet (5 mg total) by mouth daily. 07/15/17  Yes Hoyt Koch, MD  esomeprazole (NEXIUM) 20 MG capsule Take 20 mg by mouth daily.    Yes [provider]  linaclotide Rolan Lipa) 72 MCG capsule Take 1 capsule (72 mcg total) by mouth daily before breakfast. 11/15/17  Yes  Milus Banister, MD  nortriptyline (PAMELOR) 25 MG capsule Take 30 mg by mouth at bedtime.  04/22/15  Yes [provider]    Family History Family History  Problem Relation Age of Onset  . Thyroid cancer Mother   . Heart disease Mother   . Macular degeneration Mother   . Stroke Mother   . Hypertension Mother   . Glaucoma Father   . Pneumonia Father        passed away with this  . Hypertension Other   . Stomach cancer Paternal Grandmother   . Colon cancer Neg Hx   . Colon polyps Neg Hx   . Rectal cancer Neg Hx   . Pancreatic cancer Neg Hx     Social History Social History   Tobacco Use  . Smoking status: Never Smoker  . Smokeless tobacco: Never Used  Substance Use Topics  . Alcohol use: Yes    Alcohol/week: 0.0 standard drinks    Comment: socially 1-2 glasses weekly  . Drug use: No     Allergies   Ciprofloxacin; Other; Desipramine hcl; Penicillins; and Tape   Review of Systems Review of Systems  Skin: Positive for wound.  All other systems reviewed and are negative.    Physical Exam Triage Vital Signs ED Triage Vitals [01/27/18 1306]  Enc Vitals Group     BP (!) 148/90     Pulse Rate 79     Resp 16     Temp 98 F (36.7 C)     Temp Source Oral     SpO2 95 %     Weight      Height      Head Circumference      Peak Flow      Pain Score 6     Pain Loc      Pain Edu?      Excl. in [place]?    No data found.  Updated Vital Signs BP (!) 148/90 (BP Location: Left Arm)   Pulse 79   Temp 98 F (36.7 C) (Oral)   Resp 16   SpO2 95%   Visual Acuity Right Eye Distance:   Left Eye Distance:   Bilateral Distance:    Right Eye Near:   Left Eye Near:    Bilateral Near:     Physical Exam Vitals signs and nursing note reviewed.  Constitutional:  Appearance: Normal appearance.  HENT:     Head: Normocephalic.  Neck:     Musculoskeletal: Normal range of motion.  Cardiovascular:     Rate and Rhythm: Normal rate and regular rhythm.    Pulmonary:     Effort: Pulmonary effort is normal.  Musculoskeletal: Normal range of motion.  Skin:    General: Skin is warm and dry.     Findings: Wound present.     Comments: There is an approximately 1 cm linear superficial laceration to the medial aspect of the distal portion of the right middle finger.  No active bleeding.  No erythema.  No swelling.  Mild tenderness to touch.  Neurological:     General: No focal deficit present.     Mental Status: She is alert and oriented to person, place, and time.  Psychiatric:        Mood and Affect: Mood normal.      UC Treatments / Results  Labs (all labs ordered are listed, but only abnormal results are displayed) Labs Reviewed - No data to display  EKG None  Radiology No results found.  Procedures Procedures (including critical care time)  Medications Ordered in UC Medications - No data to display  Initial Impression / Assessment and Plan / UC Course  I have reviewed the triage vital signs and the nursing notes.  Pertinent labs & imaging results that were available during my care of the patient were reviewed by me and considered in my medical decision making (see chart for details).     73 year old female presenting with a superficial laceration to the distal aspect of the right middle finger. Primary closure not indicated. No signs or symptoms of infection. Supportive care advised.   Wound care discussed. Tetanus immunization not indicated. Follow-up with PCP as needed  Today's evaluation has revealed no signs of a dangerous process. Discussed diagnosis with patient. Patient aware of their diagnosis, possible red flag symptoms to watch out for and need for close follow up. Patient understands verbal and written discharge instructions. Patient comfortable with plan and disposition.  Patient has a clear mental status at this time, good insight into illness (after discussion and teaching) and has clear judgment to make  decisions regarding their care.  Documentation was completed with the aid of voice recognition software. Transcription may contain typographical errors.   Final Clinical Impressions(s) / UC Diagnoses   Final diagnoses:  Laceration of right middle finger without foreign body without damage to nail, initial encounter     Discharge Instructions       Keep the wound clean and dry.  Use a clean towel to pat the wound dry after bathing and/or washing your hands. Do not rub or scrub the wound.  Apply a thin layer of antibiotic ointment daily to wound.   Cover the incision with a clean dressing every day  Check your wound area every day for signs of infection. Check for: ? Redness, swelling, or pain. ? Fluid or blood. ? Warmth. ? Pus or a bad smell.    ED Prescriptions    None     Controlled Substance Prescriptions [place] Controlled Substance Registry consulted? Not Applicable   Enrique Sack, [place] 01/27/18 1332    Orlin [place], [place] 01/27/18 [phone]

## 2018-01-27 NOTE — ED Triage Notes (Signed)
Pt presents to [facility] for assessment of laceration to middle finger of the right hand, happened yesterday with a kitchen knife while cutting an apple.  Bruising noted around it.  Patient states "it's still painful today, so I wanted to make sure it was okay".  Last tetanus <5 years.

## 2018-01-30 ENCOUNTER — Encounter (INDEPENDENT_AMBULATORY_CARE_PROVIDER_SITE_OTHER): Payer: Self-pay | Admitting: Orthopedic Surgery

## 2018-01-30 MED ORDER — LIDOCAINE HCL 1 % IJ SOLN
5.0000 mL | INTRAMUSCULAR | Status: AC | PRN
  Administered 2018-01-30: 5 mL

## 2018-01-30 MED ORDER — HYLAN G-F 20 48 MG/6ML IX SOSY
48.0000 mg | PREFILLED_SYRINGE | INTRA_ARTICULAR | Status: AC | PRN
  Administered 2018-01-30: 48 mg via INTRA_ARTICULAR

## 2018-01-30 NOTE — Progress Notes (Signed)
   Procedure Note  Patient: Rhonda Henry             Date of Birth: August 02, 1945           MRN: [id number]             Visit Date: 01/25/2018  Procedures: Visit Diagnoses: Unilateral primary osteoarthritis, right knee  Large Joint Inj: R knee on 01/30/2018 11:38 PM Indications: pain, joint swelling and diagnostic evaluation Details: 18 G 1.5 in needle, superolateral approach  Arthrogram: No  Medications: 5 mL lidocaine 1 %; 48 mg Hylan 48 MG/6ML Outcome: tolerated well, no immediate complications Procedure, treatment alternatives, risks and benefits explained, specific risks discussed. Consent was given by the patient. Immediately prior to procedure a time out was called to verify the correct patient, procedure, equipment, support staff and site/side marked as required. Patient was prepped and draped in the usual sterile fashion.

## 2018-02-08 ENCOUNTER — Ambulatory Visit: Payer: Medicare Other

## 2018-02-27 ENCOUNTER — Encounter (INDEPENDENT_AMBULATORY_CARE_PROVIDER_SITE_OTHER): Payer: Self-pay | Admitting: Orthopedic Surgery

## 2018-02-27 NOTE — Telephone Encounter (Signed)
Please send that letter.  I will sign it.  Thanks

## 2018-02-28 ENCOUNTER — Encounter (INDEPENDENT_AMBULATORY_CARE_PROVIDER_SITE_OTHER): Payer: Self-pay

## 2018-02-28 ENCOUNTER — Encounter: Payer: Self-pay | Admitting: Internal Medicine

## 2018-03-06 ENCOUNTER — Telehealth (INDEPENDENT_AMBULATORY_CARE_PROVIDER_SITE_OTHER): Payer: Self-pay | Admitting: Orthopedic Surgery

## 2018-03-06 NOTE — Telephone Encounter (Signed)
Patient called to see if letter was ready. Not in drawer per previous note.  Please call patient to advise.  [phone]

## 2018-03-06 NOTE — Telephone Encounter (Signed)
IC LM advised could pick up at front desk.

## 2018-03-23 ENCOUNTER — Ambulatory Visit: Payer: Self-pay | Admitting: Internal Medicine

## 2018-03-23 ENCOUNTER — Encounter: Payer: Self-pay | Admitting: Internal Medicine

## 2018-03-23 NOTE — Telephone Encounter (Signed)
Pt called in.  My glands are sore under my arms and around my neck mostly at the bottom of my throat.    My throat is very sore.   I'm using a lot of diluted bleach solution cleaning things.  Sore throat for a week.  No exposures that I'm aware of.    The guy that does my nails came back form [place] I was exposed to him but he had no symptoms. I've been self quarantine.  I wore a mask and used gloves when I went to the store.   I even cleaned inside my car with the diluted bleach solution.    When I got home I took a shower.   "I'm just trying to be so careful. Her main symptoms and concern is the sore glands under her arms and bottom of her throat.   Also her very sore throat for a week.    She also feels like it's an effort to take a breath in.   Not short of breath just feels "like a tightness over my chest bone".   "I've been using a lot of diluted bleach solution".    I advised her it would be best to see a provider due to the variety of her symptoms.   I offered to make her an appt to see Dr. Sharlet Salina however the pt chose to wait until in the morning and see how she feels.   Due to the coronavirus pandemic going on she does not want to be exposed to anyone by going to the ED or coming to the doctor's office unless she has to.  I tried to get her to let me make her an appt at least with Dr. Sharlet Salina however she still did not want to come in.  I instructed her in the s/s to be aware of and when to call 911.   She agreed to do this.   She is going to call back at 8:00 in the morning if she is not feeling well.  I have forwarded these notes to Dr. Nathanial Millman nurse pool so she would be aware.    Reason for Disposition . [1] MILD difficulty breathing (e.g., minimal/no SOB at rest, SOB with walking, pulse <100) AND [2] NEW-onset or WORSE than normal  Answer Assessment - Initial Assessment Questions 1. RESPIRATORY STATUS: "Describe your breathing?" (e.g., wheezing, shortness of breath, unable to speak,  severe coughing)      I feel like there is a weight on my chest. 2. ONSET: "When did this breathing problem begin?"      *No Answer* 3. PATTERN "Does the difficult breathing come and go, or has it been constant since it started?"      *No Answer* 4. SEVERITY: "How bad is your breathing?" (e.g., mild, moderate, severe)    - MILD: No SOB at rest, mild SOB with walking, speaks normally in sentences, can lay down, no retractions, pulse < 100.    - MODERATE: SOB at rest, SOB with minimal exertion and prefers to sit, cannot lie down flat, speaks in phrases, mild retractions, audible wheezing, pulse 100-120.    - SEVERE: Very SOB at rest, speaks in single words, struggling to breathe, sitting hunched forward, retractions, pulse > 120      *No Answer* 5. RECURRENT SYMPTOM: "Have you had difficulty breathing before?" If so, ask: "When was the last time?" and "What happened that time?"      *No Answer* 6. CARDIAC HISTORY: "Do you have  any history of heart disease?" (e.g., heart attack, angina, bypass surgery, angioplasty)      *No Answer* 7. LUNG HISTORY: "Do you have any history of lung disease?"  (e.g., pulmonary embolus, asthma, emphysema)     *No Answer* 8. CAUSE: "What do you think is causing the breathing problem?"      *No Answer* 9. OTHER SYMPTOMS: "Do you have any other symptoms? (e.g., dizziness, runny nose, cough, chest pain, fever)     *No Answer* 10. PREGNANCY: "Is there any chance you are pregnant?" "When was your last menstrual period?"       *No Answer* 11. TRAVEL: "Have you traveled out of the country in the last month?" (e.g., travel history, exposures)       *No Answer*  Answer Assessment - Initial Assessment Questions 1. LOCATION: "Where does it hurt?"       I feel like a weight is on my chest on my chest bone and bottom of my throat.   No cardiac history.   A while ago I went to the ED because I thought I was having a heart attack.   All tests were negative.   2. RADIATION:  "Does the pain go anywhere else?" (e.g., into neck, jaw, arms, back)     No 3. ONSET: "When did the chest pain begin?" (Minutes, hours or days)      Today around 9:00 AM.   I've been c/o swollen glands and sore throat.    It's an effort to take a deep breath.   No pain. 4. PATTERN "Does the pain come and go, or has it been constant since it started?"  "Does it get worse with exertion?"      Constant since 9:00am 5. DURATION: "How long does it last" (e.g., seconds, minutes, hours)     Constant 6. SEVERITY: "How bad is the pain?"  (e.g., Scale 1-10; mild, moderate, or severe)    - MILD (1-3): doesn't interfere with normal activities     - MODERATE (4-7): interferes with normal activities or awakens from sleep    - SEVERE (8-10): excruciating pain, unable to do any normal activities       2-3 pressure on a scale   7. CARDIAC RISK FACTORS: "Do you have any history of heart problems or risk factors for heart disease?" (e.g., prior heart attack, angina; high blood pressure, diabetes, being overweight, high cholesterol, smoking, or strong family history of heart disease)     No.   See above 8. PULMONARY RISK FACTORS: "Do you have any history of lung disease?"  (e.g., blood clots in lung, asthma, emphysema, birth control pills)     None of the ABOVE 9. CAUSE: "What do you think is causing the chest pain?"     I don't know.   Only thing  Maybe using the diluted bleach solution.   I used it in my car .  10. OTHER SYMPTOMS: "Do you have any other symptoms?" (e.g., dizziness, nausea, vomiting, sweating, fever, difficulty breathing, cough)       No fever, chills or body aches.   Only the pain under my arms in those glands.   Also under my throat is sore too in the glands. 11. PREGNANCY: "Is there any chance you are pregnant?" "When was your last menstrual period?"       N/A  Protocols used: BREATHING DIFFICULTY-A-AH, CHEST PAIN-A-AH

## 2018-03-23 NOTE — Telephone Encounter (Signed)
Can we reach out to her tomorrow afternoon to see how she's feeling?

## 2018-03-24 NOTE — Telephone Encounter (Signed)
LVM for patient asking how she was doing. Or if doing any better

## 2018-03-29 ENCOUNTER — Encounter (INDEPENDENT_AMBULATORY_CARE_PROVIDER_SITE_OTHER): Payer: Self-pay | Admitting: Orthopedic Surgery

## 2018-04-11 ENCOUNTER — Other Ambulatory Visit: Payer: Self-pay | Admitting: Internal Medicine

## 2018-04-11 MED ORDER — AMLODIPINE BESYLATE 5 MG PO TABS: 5.0000 mg | ORAL_TABLET | Freq: Every day | ORAL | 1 refills | Status: DC

## 2018-04-25 ENCOUNTER — Encounter (INDEPENDENT_AMBULATORY_CARE_PROVIDER_SITE_OTHER): Payer: Self-pay | Admitting: Orthopedic Surgery

## 2018-05-02 ENCOUNTER — Encounter: Payer: Self-pay | Admitting: Internal Medicine

## 2018-05-02 ENCOUNTER — Ambulatory Visit (INDEPENDENT_AMBULATORY_CARE_PROVIDER_SITE_OTHER): Payer: Medicare Other | Admitting: Internal Medicine

## 2018-05-02 DIAGNOSIS — H9312 Tinnitus, left ear: Secondary | ICD-10-CM

## 2018-05-02 DIAGNOSIS — H9319 Tinnitus, unspecified ear: Secondary | ICD-10-CM | POA: Insufficient documentation

## 2018-05-02 NOTE — Assessment & Plan Note (Signed)
Could be related to seasonal allergies with more pollen exposure this year due to pandemic. Advised to start claritin or zyrtec or flonase and see if this helps. If vision declines or is persistently worse call her eye specialist.

## 2018-05-02 NOTE — Progress Notes (Signed)
Virtual Visit via Video Note  I connected with Rhonda Henry on 05/02/18 at  1:00 PM EDT by a video enabled telemedicine application and verified that I am speaking with the correct person using two identifiers.   I discussed the limitations of evaluation and management by telemedicine and the availability of in person appointments. The patient expressed understanding and agreed to proceed.  History of Present Illness: The patient is a 73 y.o. female with visit for tinnitus in the left ear. Started a couple of weeks ago. She has been walking outside daily. She also has some change in the vision in her left eye which has known cataracts and reducing vision gradually for some time now. Denies fevers or chills and is staying at home and practicing social distancing. Denies cough or SOB. Denies ear pain or drainage. Mild sinus pressure left sinus. Overall it is stable since onset. Tinnitus worse at night time. Has tried nothing  Observations/Objective: Appearance: normal, breathing appears normal, normal grooming, abdomen does not appear distended, throat normal, eyes EOM intact bilaterally, mental status is A and O times 3  Assessment and Plan: See problem oriented charting  Follow Up Instructions: start claritin daily to see if this helps  I discussed the assessment and treatment plan with the patient. The patient was provided an opportunity to ask questions and all were answered. The patient agreed with the plan and demonstrated an understanding of the instructions.   The patient was advised to call back or seek an in-person evaluation if the symptoms worsen or if the condition fails to improve as anticipated.  Hoyt Koch, MD

## 2018-05-13 ENCOUNTER — Encounter: Payer: Self-pay | Admitting: Internal Medicine

## 2018-05-13 ENCOUNTER — Encounter: Payer: Self-pay | Admitting: Orthopedic Surgery

## 2018-05-15 ENCOUNTER — Telehealth: Payer: Self-pay

## 2018-05-15 MED ORDER — NEOMYCIN-POLYMYXIN-HC 3.5-10000-1 OT SUSP: 3.0000 [drp] | Freq: Three times a day (TID) | OTIC | 0 refills | Status: DC

## 2018-05-15 NOTE — Telephone Encounter (Signed)
Submitted VOB for SynviscOne, right knee.  Will contact patient once this has been approved.

## 2018-05-15 NOTE — Telephone Encounter (Signed)
Submitted for SynviscOne, right knee.  Message has been sent to patient also.

## 2018-05-18 ENCOUNTER — Telehealth: Payer: Self-pay

## 2018-05-18 NOTE — Telephone Encounter (Signed)
Talked with patient and advised her that she is approved for gel injection.  Approved for SynviscOne, right knee. Buy & Bill Covered at 100% through second insurance Web [employer]) after [employer] pays. No Co-pay No PA required  Appt. 07/27/2018 with Dr. Marlou Sa

## 2018-05-21 ENCOUNTER — Encounter: Payer: Self-pay | Admitting: Internal Medicine

## 2018-06-05 DIAGNOSIS — H9202 Otalgia, left ear: Secondary | ICD-10-CM | POA: Diagnosis not present

## 2018-06-05 DIAGNOSIS — H9313 Tinnitus, bilateral: Secondary | ICD-10-CM | POA: Diagnosis not present

## 2018-06-13 DIAGNOSIS — D485 Neoplasm of uncertain behavior of skin: Secondary | ICD-10-CM | POA: Diagnosis not present

## 2018-06-15 DIAGNOSIS — L986 Other infiltrative disorders of the skin and subcutaneous tissue: Secondary | ICD-10-CM | POA: Diagnosis not present

## 2018-06-15 DIAGNOSIS — G894 Chronic pain syndrome: Secondary | ICD-10-CM | POA: Diagnosis not present

## 2018-06-15 DIAGNOSIS — G500 Trigeminal neuralgia: Secondary | ICD-10-CM | POA: Diagnosis not present

## 2018-06-15 DIAGNOSIS — M79642 Pain in left hand: Secondary | ICD-10-CM | POA: Diagnosis not present

## 2018-06-15 DIAGNOSIS — Z79899 Other long term (current) drug therapy: Secondary | ICD-10-CM | POA: Diagnosis not present

## 2018-06-15 DIAGNOSIS — L309 Dermatitis, unspecified: Secondary | ICD-10-CM | POA: Diagnosis not present

## 2018-06-20 ENCOUNTER — Encounter: Payer: Self-pay | Admitting: Internal Medicine

## 2018-06-20 ENCOUNTER — Encounter: Payer: Self-pay | Admitting: Orthopedic Surgery

## 2018-06-20 MED ORDER — BUSPIRONE HCL 5 MG PO TABS: 5.0000 mg | ORAL_TABLET | Freq: Two times a day (BID) | ORAL | 0 refills | Status: DC | PRN

## 2018-06-28 DIAGNOSIS — M79642 Pain in left hand: Secondary | ICD-10-CM | POA: Diagnosis not present

## 2018-06-28 DIAGNOSIS — G894 Chronic pain syndrome: Secondary | ICD-10-CM | POA: Diagnosis not present

## 2018-06-28 DIAGNOSIS — G500 Trigeminal neuralgia: Secondary | ICD-10-CM | POA: Diagnosis not present

## 2018-06-28 DIAGNOSIS — Z79899 Other long term (current) drug therapy: Secondary | ICD-10-CM | POA: Diagnosis not present

## 2018-06-30 ENCOUNTER — Encounter: Payer: Self-pay | Admitting: Orthopedic Surgery

## 2018-06-30 NOTE — Telephone Encounter (Signed)
Yeah   hmmmm   ok

## 2018-07-03 ENCOUNTER — Other Ambulatory Visit: Payer: Self-pay

## 2018-07-03 ENCOUNTER — Encounter: Payer: Self-pay | Admitting: Internal Medicine

## 2018-07-03 ENCOUNTER — Ambulatory Visit (INDEPENDENT_AMBULATORY_CARE_PROVIDER_SITE_OTHER): Payer: Medicare Other | Admitting: Internal Medicine

## 2018-07-03 VITALS — BP 132/80 | HR 78 | Temp 98.6°F | Ht 61.0 in | Wt 175.0 lb

## 2018-07-03 DIAGNOSIS — F418 Other specified anxiety disorders: Secondary | ICD-10-CM

## 2018-07-03 DIAGNOSIS — H9312 Tinnitus, left ear: Secondary | ICD-10-CM | POA: Diagnosis not present

## 2018-07-03 DIAGNOSIS — F5105 Insomnia due to other mental disorder: Secondary | ICD-10-CM | POA: Diagnosis not present

## 2018-07-03 MED ORDER — DIAZEPAM 5 MG PO TABS: 2.5000 mg | ORAL_TABLET | Freq: Every day | ORAL | 0 refills | Status: DC | PRN

## 2018-07-03 NOTE — Patient Instructions (Signed)
Try taking tylenol or ibuprofen regular for the next 4-5 days to see if that helps the jaw.

## 2018-07-03 NOTE — Assessment & Plan Note (Signed)
Stable at this time, declines audiology evaluation as her hearing seems intact to her. Advised that she could have TMJ and that stress often does cause more jaw grinding/clenching. She will work with stress management techniques and work on taking tylenol/ibuprofen regularly for about 1 week to see if this helps.

## 2018-07-03 NOTE — Progress Notes (Signed)
   Subjective:   Patient ID: Rhonda Henry, female    DOB: 01/27/1945, 73 y.o.   MRN: [id number]  HPI The patient is a 73 YO female coming in for ringing in her ears. She has had this for several months. She is also having some pain in the left jaw. She had problems with this about 4 years ago which was not well explained. She has seen her pain doctor as the nerve pain in this area was acutely worse in the last 1-2 months as well. She is taking nortriptyline and the dose of this was increased. This is helping with the pain about 95% of the time. She is still having tinnitus in the left ear all the time. Went to ENT and they were not able to find any problem but suggested TMJ. She has not taken anything for this. She has an oral appliance but it does not fit so she does not wear it as it causes pain. Denies fevers or chills. Denies sore throat or nasal drainage.   Review of Systems  Constitutional: Negative.   HENT: Positive for dental problem, ear pain and tinnitus. Negative for ear discharge, hearing loss, postnasal drip, rhinorrhea and sinus pain.   Eyes: Negative.   Respiratory: Negative for cough, chest tightness and shortness of breath.   Cardiovascular: Negative for chest pain, palpitations and leg swelling.  Gastrointestinal: Negative for abdominal distention, abdominal pain, constipation, diarrhea, nausea and vomiting.  Musculoskeletal: Negative.   Skin: Negative.   Neurological: Negative.   Psychiatric/Behavioral: Negative.     Objective:  Physical Exam Constitutional:      Appearance: She is well-developed.  HENT:     Head: Normocephalic and atraumatic.     Comments: Sore to touch over the left TMJ area    Right Ear: Tympanic membrane and ear canal normal. There is no impacted cerumen.     Left Ear: Tympanic membrane and ear canal normal. There is no impacted cerumen.  Neck:     Musculoskeletal: Normal range of motion.  Cardiovascular:     Rate and Rhythm: Normal rate and  regular rhythm.  Pulmonary:     Effort: Pulmonary effort is normal. No respiratory distress.     Breath sounds: Normal breath sounds. No wheezing or rales.  Abdominal:     General: Bowel sounds are normal. There is no distension.     Palpations: Abdomen is soft.     Tenderness: There is no abdominal tenderness. There is no rebound.  Skin:    General: Skin is warm and dry.  Neurological:     Mental Status: She is alert and oriented to person, place, and time.     Coordination: Coordination normal.     Vitals:   07/03/18 0937  BP: 132/80  Pulse: 78  Temp: 98.6 F (37 C)  TempSrc: Oral  SpO2: 99%  Weight: 175 lb (79.4 kg)  Height: 5\' 1"  (1.549 m)    Assessment & Plan:

## 2018-07-03 NOTE — Assessment & Plan Note (Signed)
Small amount of valium given today for stress/anxiety with pandemic.

## 2018-07-27 ENCOUNTER — Encounter: Payer: Self-pay | Admitting: Orthopedic Surgery

## 2018-07-27 ENCOUNTER — Ambulatory Visit (INDEPENDENT_AMBULATORY_CARE_PROVIDER_SITE_OTHER): Payer: Medicare Other | Admitting: Orthopedic Surgery

## 2018-07-27 ENCOUNTER — Telehealth: Payer: Self-pay

## 2018-07-27 DIAGNOSIS — M1711 Unilateral primary osteoarthritis, right knee: Secondary | ICD-10-CM | POA: Diagnosis not present

## 2018-07-27 MED ORDER — HYLAN G-F 20 48 MG/6ML IX SOSY
48.0000 mg | PREFILLED_SYRINGE | INTRA_ARTICULAR | Status: AC | PRN
  Administered 2018-07-27: 48 mg via INTRA_ARTICULAR

## 2018-07-27 MED ORDER — LIDOCAINE HCL 1 % IJ SOLN
5.0000 mL | INTRAMUSCULAR | Status: AC | PRN
  Administered 2018-07-27: 5 mL

## 2018-07-27 NOTE — Telephone Encounter (Signed)
Can we get pre auth for repeat gel injection right knee in 6 months

## 2018-07-27 NOTE — Progress Notes (Signed)
   Procedure Note  Patient: Rhonda Henry             Date of Birth: [birth year]           MRN: [id number]             Visit Date: 07/27/2018  Procedures: Visit Diagnoses:  1. Unilateral primary osteoarthritis, right knee     Large Joint Inj: R knee on 07/27/2018 9:09 AM Indications: pain, joint swelling and diagnostic evaluation Details: 18 G 1.5 in needle, superolateral approach  Arthrogram: No  Medications: 5 mL lidocaine 1 %; 48 mg Hylan 48 MG/6ML Outcome: tolerated well, no immediate complications Procedure, treatment alternatives, risks and benefits explained, specific risks discussed. Consent was given by the patient. Immediately prior to procedure a time out was called to verify the correct patient, procedure, equipment, support staff and site/side marked as required. Patient was prepped and draped in the usual sterile fashion.     This patient is diagnosed with osteoarthritis of the knee(s).    Radiographs show evidence of joint space narrowing, osteophytes, subchondral sclerosis and/or subchondral cysts.  This patient has knee pain which interferes with functional and activities of daily living.    This patient has experienced inadequate response, adverse effects and/or intolerance with conservative treatments such as acetaminophen, NSAIDS, topical creams, physical therapy or regular exercise, knee bracing and/or weight loss.   This patient has experienced inadequate response or has a contraindication to intra articular steroid injections for at least 3 months.   This patient is not scheduled to have a total knee replacement within 6 months of starting treatment with viscosupplementation.

## 2018-07-31 NOTE — Telephone Encounter (Signed)
Noted.  Appointment would be after 01/27/2019 for next gel injection.

## 2018-08-07 DIAGNOSIS — G501 Atypical facial pain: Secondary | ICD-10-CM | POA: Diagnosis not present

## 2018-08-07 DIAGNOSIS — R6884 Jaw pain: Secondary | ICD-10-CM | POA: Diagnosis not present

## 2018-08-07 DIAGNOSIS — G44329 Chronic post-traumatic headache, not intractable: Secondary | ICD-10-CM | POA: Diagnosis not present

## 2018-08-07 DIAGNOSIS — G500 Trigeminal neuralgia: Secondary | ICD-10-CM | POA: Diagnosis not present

## 2018-08-11 ENCOUNTER — Ambulatory Visit: Payer: Medicare Other | Admitting: Neurology

## 2018-08-11 ENCOUNTER — Encounter

## 2018-08-25 DIAGNOSIS — G501 Atypical facial pain: Secondary | ICD-10-CM | POA: Diagnosis not present

## 2018-08-25 DIAGNOSIS — G500 Trigeminal neuralgia: Secondary | ICD-10-CM | POA: Diagnosis not present

## 2018-08-25 DIAGNOSIS — R6884 Jaw pain: Secondary | ICD-10-CM | POA: Diagnosis not present

## 2018-08-25 DIAGNOSIS — G44009 Cluster headache syndrome, unspecified, not intractable: Secondary | ICD-10-CM | POA: Diagnosis not present

## 2018-08-30 ENCOUNTER — Telehealth: Payer: Self-pay | Admitting: Orthopedic Surgery

## 2018-08-30 NOTE — Telephone Encounter (Signed)
See below,  cortisone?

## 2018-08-30 NOTE — Telephone Encounter (Signed)
yEs pls clala thx

## 2018-08-30 NOTE — Telephone Encounter (Signed)
Patient called stating she received a gel injection back in July and now she is having trouble walking on that knee.  She wants to know what can be done next?  CB#[phone].  Thank you.

## 2018-08-31 ENCOUNTER — Telehealth: Payer: Self-pay | Admitting: Orthopedic Surgery

## 2018-08-31 NOTE — Telephone Encounter (Signed)
Called patient advised per Dr. Marlou Sa she can get a cortisone injection if she would like. Patient advised she do not want to get a cortisone injection.  [phone]

## 2018-08-31 NOTE — Telephone Encounter (Signed)
Can you call patient and tell her she can have a cortisone injection if she would like, and schedule her with Marlou Sa please

## 2018-08-31 NOTE — Telephone Encounter (Signed)
Noted  

## 2018-09-08 DIAGNOSIS — K011 Impacted teeth: Secondary | ICD-10-CM | POA: Diagnosis not present

## 2018-09-08 DIAGNOSIS — R6884 Jaw pain: Secondary | ICD-10-CM | POA: Diagnosis not present

## 2018-09-22 DIAGNOSIS — Z23 Encounter for immunization: Secondary | ICD-10-CM | POA: Diagnosis not present

## 2018-09-26 DIAGNOSIS — K0381 Cracked tooth: Secondary | ICD-10-CM | POA: Diagnosis not present

## 2018-10-03 ENCOUNTER — Telehealth: Payer: Self-pay

## 2018-10-03 NOTE — Telephone Encounter (Signed)
Patient states she has been in contact and had appointments in regard to the ringing in her ears. States that she sees someone for pain and when he saw them in mid may was told she needed to see a neurologist to discuss her pain and ringing in ears. Patient made an appointment with [facility] and made an appointment with [facility] and ended up doing televisit with [facility]. States she has been on ABX, antiinflammatory, and had a  tooth removed because it might of been hitting a nerve. Patient is having trouble getting in touch with the doctor at [facility] in regard to her issues that are going on and feels he knows what is going on but is not letting her know. Patient wanting to talk to Dr. Sharlet Salina to discuss but did not want to make an appointment. Or wanting to know if Dr. Sharlet Salina can get in contact with the doctor from [facility] to find out what is going on. Dr. Van Clines number [phone].   Patient is going to have their office fax [place] the records from her visits with [facility] neurology.  Patient just would like Dr. Sharlet Salina to be an advocate for her. Patient is just wanting to know why she is having this facial pain and why she is having this ringing in her ears.

## 2018-10-03 NOTE — Telephone Encounter (Signed)
Copied from [place] [id number]. Topic: General - Other >> Oct 03, 2018  1:01 PM Sheran Luz wrote: Patient requesting to speak with Dr. Sharlet Salina. Patient would not disclose any additional information.

## 2018-10-04 ENCOUNTER — Encounter: Payer: Self-pay | Admitting: Internal Medicine

## 2018-10-04 DIAGNOSIS — G500 Trigeminal neuralgia: Secondary | ICD-10-CM

## 2018-10-04 NOTE — Telephone Encounter (Signed)
The ringing in the ears is likely related to some hearing loss and is likely to be chronic. I am happy to review records to see if the facial pain has been diagnosed as anything from [place] visit.

## 2018-10-05 ENCOUNTER — Encounter: Payer: Self-pay | Admitting: Internal Medicine

## 2018-10-05 NOTE — Telephone Encounter (Signed)
Patient has appointment tomorrow to discuss.

## 2018-10-05 NOTE — Telephone Encounter (Signed)
Appointment scheduled.  Patient would like to see if Dr Sharlet Salina could reach out to [facility] to discuss these issues with her doctor there.

## 2018-10-06 ENCOUNTER — Ambulatory Visit (INDEPENDENT_AMBULATORY_CARE_PROVIDER_SITE_OTHER): Payer: Medicare Other | Admitting: Internal Medicine

## 2018-10-06 ENCOUNTER — Encounter: Payer: Self-pay | Admitting: Internal Medicine

## 2018-10-06 ENCOUNTER — Other Ambulatory Visit: Payer: Self-pay

## 2018-10-06 ENCOUNTER — Other Ambulatory Visit (INDEPENDENT_AMBULATORY_CARE_PROVIDER_SITE_OTHER): Payer: Medicare Other

## 2018-10-06 VITALS — BP 110/86 | HR 86 | Temp 97.6°F | Ht 61.0 in

## 2018-10-06 DIAGNOSIS — E559 Vitamin D deficiency, unspecified: Secondary | ICD-10-CM

## 2018-10-06 DIAGNOSIS — R7301 Impaired fasting glucose: Secondary | ICD-10-CM | POA: Diagnosis not present

## 2018-10-06 DIAGNOSIS — R200 Anesthesia of skin: Secondary | ICD-10-CM | POA: Diagnosis not present

## 2018-10-06 DIAGNOSIS — R202 Paresthesia of skin: Secondary | ICD-10-CM

## 2018-10-06 DIAGNOSIS — H9313 Tinnitus, bilateral: Secondary | ICD-10-CM

## 2018-10-06 DIAGNOSIS — I10 Essential (primary) hypertension: Secondary | ICD-10-CM

## 2018-10-06 DIAGNOSIS — G500 Trigeminal neuralgia: Secondary | ICD-10-CM | POA: Diagnosis not present

## 2018-10-06 LAB — COMPREHENSIVE METABOLIC PANEL
ALT: 19 U/L (ref 0–35)
AST: 13 U/L (ref 0–37)
Albumin: 4.1 g/dL (ref 3.5–5.2)
Alkaline Phosphatase: 117 U/L (ref 39–117)
BUN: 24 mg/dL — ABNORMAL HIGH (ref 6–23)
CO2: 31 mEq/L (ref 19–32)
Calcium: 9.7 mg/dL (ref 8.4–10.5)
Chloride: 105 mEq/L (ref 96–112)
Creatinine, Ser: 0.68 mg/dL (ref 0.40–1.20)
GFR: 84.66 mL/min (ref >60.00)
Glucose, Bld: 96 mg/dL (ref 70–99)
Potassium: 4.5 mEq/L (ref 3.5–5.1)
Sodium: 142 mEq/L (ref 135–145)
Total Bilirubin: 0.3 mg/dL (ref 0.2–1.2)
Total Protein: 6.6 g/dL (ref 6.0–8.3)

## 2018-10-06 LAB — CBC
HCT: 39.8 % (ref 36.0–46.0)
Hemoglobin: 12.9 g/dL (ref 12.0–15.0)
MCHC: 32.5 g/dL (ref 30.0–36.0)
MCV: 85 fl (ref 78.0–100.0)
Platelets: 251 10*3/uL (ref 150.0–400.0)
RBC: 4.68 Mil/uL (ref 3.87–5.11)
RDW: 14 % (ref 11.5–15.5)
WBC: 6.5 10*3/uL (ref 4.0–10.5)

## 2018-10-06 LAB — LIPID PANEL
Cholesterol: 238 mg/dL — ABNORMAL HIGH (ref 0–200)
HDL: 49.4 mg/dL (ref >39.00)
LDL Cholesterol: 170 mg/dL — ABNORMAL HIGH (ref 0–99)
NonHDL: 188.98
Total CHOL/HDL Ratio: 5
Triglycerides: 93 mg/dL (ref 0.0–149.0)
VLDL: 18.6 mg/dL (ref 0.0–40.0)

## 2018-10-06 LAB — VITAMIN D 25 HYDROXY (VIT D DEFICIENCY, FRACTURES): VITD: 27.71 ng/mL — ABNORMAL LOW (ref 30.00–100.00)

## 2018-10-06 LAB — VITAMIN B12: Vitamin B-12: 444 pg/mL (ref 211–911)

## 2018-10-06 LAB — T4, FREE: Free T4: 0.75 ng/dL (ref 0.60–1.60)

## 2018-10-06 LAB — HEMOGLOBIN A1C: Hgb A1c MFr Bld: 5.8 % (ref 4.6–6.5)

## 2018-10-06 LAB — TSH: TSH: 2.17 u[IU]/mL (ref 0.35–4.50)

## 2018-10-06 NOTE — Progress Notes (Signed)
   Subjective:   Patient ID: Rhonda Henry, female    DOB: Oct 14, 1945, 73 y.o.   MRN: [id number]  HPI The patient is a 73 YO female coming in for concerns about ongoing facial pain. Started about 4 years ago with dental numbing procedure. She struggled with this for some time and was finally able to get this under control with lyrica. The pain subsided and she stopped taking it. Eventually pain came back, she resumed lyrica but this did not help when she went back to it. She has seen several doctors and dentists about this since. Including most recently neurology at [facility]. She was not happy with this interaction and felt he was keeping information from her and that he knew what was going on with her. She was given a cocktail of medicines to try for the pain. She is struggling with this on a daily basis currently with pain 10/10 however got a bite guard to sleep with and this helped some. She just got the tooth in questions left side pulled about 10 days or so ago. She is not sure if this is providing some additional relief or not. She does feel that some stress adds to the tinnitus and she gets worsening symptoms when arguing with husband or listening to recent presidential debate.   PMH, [facility], social history reviewed and updated  Review of Systems  Constitutional: Negative.   HENT: Positive for dental problem, ear pain, facial swelling and tinnitus.   Eyes: Negative.   Respiratory: Negative for cough, chest tightness and shortness of breath.   Cardiovascular: Negative for chest pain, palpitations and leg swelling.  Gastrointestinal: Negative for abdominal distention, abdominal pain, constipation, diarrhea, nausea and vomiting.  Musculoskeletal: Negative.   Skin: Negative.   Neurological: Negative.   Psychiatric/Behavioral: Negative.     Objective:  Physical Exam Constitutional:      Appearance: She is well-developed. She is obese.  HENT:     Head: Normocephalic and atraumatic.     Right  Ear: Tympanic membrane, ear canal and external ear normal.     Left Ear: Tympanic membrane, ear canal and external ear normal.     Mouth/Throat:     Comments: Grossly normal without redness or swelling gums or purulent drainage.  Neck:     Musculoskeletal: Normal range of motion.  Cardiovascular:     Rate and Rhythm: Normal rate and regular rhythm.  Pulmonary:     Effort: Pulmonary effort is normal. No respiratory distress.     Breath sounds: Normal breath sounds. No wheezing or rales.  Abdominal:     General: Bowel sounds are normal. There is no distension.     Palpations: Abdomen is soft.     Tenderness: There is no abdominal tenderness. There is no rebound.  Skin:    General: Skin is warm and dry.  Neurological:     Mental Status: She is alert and oriented to person, place, and time.     Coordination: Coordination normal.     Vitals:   10/06/18 0920  BP: 110/86  Pulse: 86  Temp: 97.6 F (36.4 C)  TempSrc: Oral  SpO2: 98%  Height: 5\' 1"  (1.549 m)    Assessment & Plan:  Visit time 40 minutes: greater than 50% of that time was spent in face to face counseling and coordination of care with the patient: counseled about as above and protracted course of this pain and various treatments tried with different results.

## 2018-10-06 NOTE — Assessment & Plan Note (Signed)
Discussed etiology of tinnitus and could be related to swelling from tooth and extraction and will wait 1-2 months to see if there is improvement but discussed that tinnitus is often chronic and not very treatable except with distraction.

## 2018-10-06 NOTE — Assessment & Plan Note (Signed)
Advised to wait 1-2 months after tooth extraction to see full impact on this. Can use prn carbamazepine and ketamine nasal and clonazepam prn in the meantime. She is still taking nortriptyline at this time. Depending on results we may need to adjust more or less medications for pain relief.

## 2018-10-06 NOTE — Patient Instructions (Signed)
We will do blood work today and call you back about the results.

## 2018-10-08 ENCOUNTER — Encounter: Payer: Self-pay | Admitting: Internal Medicine

## 2018-10-10 ENCOUNTER — Encounter: Payer: Self-pay | Admitting: Neurology

## 2018-10-12 ENCOUNTER — Emergency Department (HOSPITAL_COMMUNITY): Payer: Medicare Other

## 2018-10-12 ENCOUNTER — Encounter (HOSPITAL_COMMUNITY): Payer: Self-pay | Admitting: Emergency Medicine

## 2018-10-12 ENCOUNTER — Ambulatory Visit
Admission: EM | Admit: 2018-10-12 | Discharge: 2018-10-12 | Disposition: A | Discharge status: Home / Self Care | Payer: Medicare Other | Source: Home / Self Care

## 2018-10-12 ENCOUNTER — Emergency Department (HOSPITAL_COMMUNITY)
Admission: EM | Admit: 2018-10-12 | Discharge: 2018-10-12 | Disposition: A | Discharge status: Home / Self Care | Payer: Medicare Other | Attending: Emergency Medicine | Admitting: Emergency Medicine

## 2018-10-12 ENCOUNTER — Other Ambulatory Visit: Payer: Self-pay

## 2018-10-12 DIAGNOSIS — Z79899 Other long term (current) drug therapy: Secondary | ICD-10-CM | POA: Insufficient documentation

## 2018-10-12 DIAGNOSIS — S01511A Laceration without foreign body of lip, initial encounter: Secondary | ICD-10-CM | POA: Diagnosis not present

## 2018-10-12 DIAGNOSIS — R519 Headache, unspecified: Secondary | ICD-10-CM | POA: Insufficient documentation

## 2018-10-12 DIAGNOSIS — Y929 Unspecified place or not applicable: Secondary | ICD-10-CM | POA: Diagnosis not present

## 2018-10-12 DIAGNOSIS — M25532 Pain in left wrist: Secondary | ICD-10-CM

## 2018-10-12 DIAGNOSIS — S0081XA Abrasion of other part of head, initial encounter: Secondary | ICD-10-CM | POA: Insufficient documentation

## 2018-10-12 DIAGNOSIS — S199XXA Unspecified injury of neck, initial encounter: Secondary | ICD-10-CM | POA: Diagnosis not present

## 2018-10-12 DIAGNOSIS — W19XXXA Unspecified fall, initial encounter: Secondary | ICD-10-CM

## 2018-10-12 DIAGNOSIS — I10 Essential (primary) hypertension: Secondary | ICD-10-CM | POA: Insufficient documentation

## 2018-10-12 DIAGNOSIS — S60212A Contusion of left wrist, initial encounter: Secondary | ICD-10-CM | POA: Diagnosis not present

## 2018-10-12 DIAGNOSIS — S0993XA Unspecified injury of face, initial encounter: Secondary | ICD-10-CM | POA: Diagnosis not present

## 2018-10-12 DIAGNOSIS — W1830XA Fall on same level, unspecified, initial encounter: Secondary | ICD-10-CM | POA: Insufficient documentation

## 2018-10-12 DIAGNOSIS — Y999 Unspecified external cause status: Secondary | ICD-10-CM | POA: Diagnosis not present

## 2018-10-12 DIAGNOSIS — Y9301 Activity, walking, marching and hiking: Secondary | ICD-10-CM | POA: Diagnosis not present

## 2018-10-12 DIAGNOSIS — Z23 Encounter for immunization: Secondary | ICD-10-CM | POA: Diagnosis not present

## 2018-10-12 DIAGNOSIS — S0990XA Unspecified injury of head, initial encounter: Secondary | ICD-10-CM | POA: Diagnosis not present

## 2018-10-12 MED ORDER — ACETAMINOPHEN 325 MG PO TABS
650.0000 mg | ORAL_TABLET | Freq: Once | ORAL | Status: AC
  Administered 2018-10-12: 650 mg via ORAL
  Filled 2018-10-12: qty 2

## 2018-10-12 MED ORDER — LIDOCAINE-EPINEPHRINE (PF) 2 %-1:200000 IJ SOLN
5.0000 mL | Freq: Once | INTRAMUSCULAR | Status: AC
  Administered 2018-10-12: 5 mL
  Filled 2018-10-12: qty 20

## 2018-10-12 MED ORDER — CLINDAMYCIN HCL 300 MG PO CAPS: 300.0000 mg | ORAL_CAPSULE | Freq: Three times a day (TID) | ORAL | 0 refills | Status: AC

## 2018-10-12 MED ORDER — CLINDAMYCIN HCL 150 MG PO CAPS
300.0000 mg | ORAL_CAPSULE | Freq: Once | ORAL | Status: AC
  Administered 2018-10-12: 21:00:00 300 mg via ORAL
  Filled 2018-10-12: qty 2

## 2018-10-12 MED ORDER — TETANUS-DIPHTH-ACELL PERTUSSIS 5-2.5-18.5 LF-MCG/0.5 IM SUSP
0.5000 mL | Freq: Once | INTRAMUSCULAR | Status: AC
  Administered 2018-10-12: 21:00:00 0.5 mL via INTRAMUSCULAR
  Filled 2018-10-12: qty 0.5

## 2018-10-12 NOTE — Discharge Instructions (Addendum)
Today your CT scans did not show any evidence of injury from your fall and your wrist x-ray was normal.  I have given you instructions below for taking Tylenol as needed to control your pain.  It is very important that you ice your lip.  While in the emergency room your blood pressure was elevated.  Please follow-up with your primary care doctor.  Please take Tylenol (acetaminophen) to relieve your pain.  You may take tylenol, up to 1,000 mg (two extra strength pills).  Do not take more than 3,000 mg tylenol in a 24 hour period.  Please check all medication labels as many medications such as pain and cold medications may contain tylenol. Please do not drink alcohol while taking this medication.   You may have diarrhea from the antibiotics.  It is very important that you continue to take the antibiotics even if you get diarrhea unless a medical professional tells you that you may stop taking them.  If you stop too early the bacteria you are being treated for will become stronger and you may need different, more powerful antibiotics that have more side effects and worsening diarrhea.  Please stay well hydrated and consider probiotics as they may decrease the severity of your diarrhea.
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
## 2018-10-15 ENCOUNTER — Encounter: Payer: Self-pay | Admitting: Internal Medicine

## 2018-10-15 DIAGNOSIS — Z20828 Contact with and (suspected) exposure to other viral communicable diseases: Secondary | ICD-10-CM

## 2018-10-15 DIAGNOSIS — Z20822 Contact with and (suspected) exposure to covid-19: Secondary | ICD-10-CM

## 2018-10-18 ENCOUNTER — Other Ambulatory Visit: Payer: Self-pay

## 2018-10-18 DIAGNOSIS — Z20822 Contact with and (suspected) exposure to covid-19: Secondary | ICD-10-CM

## 2018-10-20 LAB — NOVEL CORONAVIRUS, NAA: SARS-CoV-2, NAA: NOT DETECTED

## 2018-10-22 ENCOUNTER — Encounter: Payer: Self-pay | Admitting: Internal Medicine

## 2018-10-26 DIAGNOSIS — H9312 Tinnitus, left ear: Secondary | ICD-10-CM | POA: Diagnosis not present

## 2018-10-26 DIAGNOSIS — S01511A Laceration without foreign body of lip, initial encounter: Secondary | ICD-10-CM | POA: Diagnosis not present

## 2018-10-26 DIAGNOSIS — H9313 Tinnitus, bilateral: Secondary | ICD-10-CM | POA: Diagnosis not present

## 2018-10-26 DIAGNOSIS — H9042 Sensorineural hearing loss, unilateral, left ear, with unrestricted hearing on the contralateral side: Secondary | ICD-10-CM | POA: Diagnosis not present

## 2018-10-26 DIAGNOSIS — G500 Trigeminal neuralgia: Secondary | ICD-10-CM | POA: Diagnosis not present

## 2018-10-26 DIAGNOSIS — H838X3 Other specified diseases of inner ear, bilateral: Secondary | ICD-10-CM | POA: Diagnosis not present

## 2018-10-26 DIAGNOSIS — H903 Sensorineural hearing loss, bilateral: Secondary | ICD-10-CM | POA: Diagnosis not present

## 2018-10-30 DIAGNOSIS — F4322 Adjustment disorder with anxiety: Secondary | ICD-10-CM | POA: Diagnosis not present

## 2018-10-30 NOTE — Progress Notes (Signed)
NEUROLOGY CONSULTATION NOTE  [name] MRN: [id number] DOB: 05-Aug-1945  Referring provider: Pricilla Holm, MD Primary care provider: Pricilla Holm, MD  Reason for consult:  left-sided trigeminal neuralgia  HISTORY OF PRESENT ILLNESS: Rhonda Kitto. Thakker is a 73 year old left-handed female who presents for left-handed trigeminal neuralgia.  History supplemented by referring provider's and referring provider's notes.  In 2016, she developed left sided facial pain after undergoing local anesthesia for a dental procedure.  She was sent to an endodontist who performed a root canal which didn't stop the pain.  She was on Lyrica for about a year, which was helpful and subsequently tried to taper off of the Lyrica but the pain returned after a month.  She increased the dose again but it was no longer effective.  She was switched to Cymbalta, which was ineffective.  She then started nortriptyline 35mg  at bedtime, which helped.  She was followed by Dr. Hardin Negus, a pain management specialist.  Every now and then, she would get a pain in the second left upper molar, however dental imaging looked okay.  She noted tooth pain may be aggravated by eating sweets or sodas.    Following a knee surgery in 2017, she improved her diet and exercised and felt well.  When the COVID-19 pandemic started in March, she started eating more baked sweets and not exercising as well.  The pain increased again.  Nortriptyline was increased to 50mg  which was ineffective.  She then developed a high-pitch ringing in her left ear.  Regarding left ear pain and tinnitus, she saw ENT Dr. Melissa Montane who believed taht she exhibited left TMJ dysfunction.  Nortriptyline was increased but pain persisted.  She has been seen by Dr. Juel Burrow, a trigeminal neuralgia specialist at [facility].  He prescribed her carbamazepine 100mg  three times daily, indomethacin 25mg  twice daily and clonazepam 0.25mg  twice daily as needed.  She  responded to indomethacin and Indomethacin was later switched to meloxicam and started on turmeric, alpha-lipoic acid and nortriptyline.  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  Otherwise, unremarkable appearance of the temporomandibular joints and course of left V2 distribution.  MRI of brain with and without contrast performed the same day was unremarkable except for appearance of same sclerotic lesion on CT, noted to be a wisdom tooth.  He subsequently performed an infiltration block and maxillary branch block followed by a course of Augmentin for pain and possible infection.  Pain persisted and she underwent tooth extraction of that left upper molar.  Oral surgery was ineffective and she was prescribed Ketamine nasal spray for breakthrough.  The high-pitched ringing and shreaking in her head has become louder and hurts.  He saw another ENT who told her it is neurological.  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  The facial pain is fairly controlled, but it is the tinnitus has become unbearable.  She now sleeps sitting up because it doesn't seem as bad.  She does not drink coffee or take aspirin.  10/06/2018 LABS:  TSH 2.17; B12 444  Current treatment:  Carbamazepine 200mg  twice daily; nortriptyline 50mg  daily; clonazepam 0.50mg  four times daily; turmeric 800mg  twice daily; alpha lipoic acid 600mg  three times daily; D3 1000IU daily Past  treatment:  Infiltration block and maxillary branch block; Ketamine NS PRN; Cymbalta, Lyrica.  PAST MEDICAL HISTORY: Past Medical History:  Diagnosis Date  . Abdominal pain, acute, right upper quadrant    had gall bladder removed and now no issues   . Allergy   . Anxiety    . Arthritis   . Bloating   . Complication of anesthesia    "I shake like crazy"  . Constipation    not now  . Depression    no issues currently   . Difficulty sleeping    no issues now  . GERD (gastroesophageal reflux disease)   . Heart murmur    no recent mention of this per pt.   . Hypertension   . Microscopic hematuria     x 30 yrs   . Nausea   . Nerve damage    face -1 yr ago and now almost resolved   . Neuromuscular disorder ([place])   . Recurrent UTI     PAST SURGICAL HISTORY: Past Surgical History:  Procedure Laterality Date  . breast biopsies    . BREAST LUMPECTOMY     x 2  . c sections     x2  . CHOLECYSTECTOMY N/A 03/13/2013   Procedure: LAPAROSCOPIC CHOLECYSTECTOMY WITH INTRAOPERATIVE CHOLANGIOGRAM;  Surgeon: [name]. [name], MD;  Location: [facility];  Service: General;  Laterality: N/A;  . COLONOSCOPY    . CYST EXCISION     removed from back and left arm  . FACIAL COSMETIC SURGERY  7-10   compl by thick scars and a scar necrosis on R cheek  . LUMBAR EPIDURAL INJECTION  Jan 15   . [place]  . TOTAL KNEE ARTHROPLASTY Left 06/17/2015   Procedure: LEFT TOTAL KNEE ARTHROPLASTY;  Surgeon: Paralee Cancel, MD;  Location: [facility];  Service: Orthopedics;  Laterality: Left;  Spinal to General    MEDICATIONS: Current Outpatient Medications on File Prior to Visit  Medication Sig Dispense Refill  . Alpha-Lipoic Acid 600 MG CAPS Take 1 capsule by mouth daily.    Marland Kitchen amLODipine (NORVASC) 5 MG tablet Take 1 tablet (5 mg total) by mouth daily. 90 tablet 1  . carbamazepine (TEGRETOL) 100 MG chewable tablet Chew 100 mg by mouth 3 (three) times daily.    . clonazePAM (KLONOPIN) 0.25 MG disintegrating tablet TAKE 1 TABLET BY MOUTH 2 TIMES DAILY AS NEEDED    . esomeprazole (NEXIUM) 20 MG capsule Take 20 mg by mouth daily.     Marland Kitchen KETAMINE HCL SL Place into the nose.    . linaclotide (LINZESS) 72 MCG capsule Take 1 capsule (72 mcg total) by mouth daily before breakfast.  30 capsule 11  . meloxicam (MOBIC) 15 MG tablet Take 15 mg by mouth daily.    . nortriptyline (PAMELOR) 25 MG capsule Take 30 mg by mouth at bedtime.   3   No current facility-administered medications on file prior to visit.     ALLERGIES: Allergies  Allergen Reactions  . Ciprofloxacin Other (See Comments)    Made her neuropathy flare  . Other Hives and Other (See Comments)    Glue - may cause hives?- patient states she was tested and it was fine   NO DRUGS THAT ARE CONTRAINDICATED FOR NEUROPATHY !  . Desipramine Hcl Rash  . Penicillins Hives and Rash     Has patient had a PCN reaction causing immediate rash, facial/tongue/throat swelling, SOB or lightheadedness with hypotension: no Has patient had a PCN reaction  causing severe rash involving mucus membranes or skin necrosis: Yes Has patient had a PCN reaction that required hospitalization No Has patient had a PCN reaction occurring within the last 10 years: No If all of the above answers are "NO", then may proceed with Cephalosporin use.   . Tape Hives    Certain surgical tape - clear surgical tape causes hives     FAMILY HISTORY: Family History  Problem Relation Age of Onset  . Thyroid cancer Mother   . Heart disease Mother   . Macular degeneration Mother   . Stroke Mother   . Hypertension Mother   . Glaucoma Father   . Pneumonia Father        passed away with this  . Hypertension Other   . Stomach cancer Paternal Grandmother   . Colon cancer Neg Hx   . Colon polyps Neg Hx   . Rectal cancer Neg Hx   . Pancreatic cancer Neg Hx    SOCIAL HISTORY: Social History   Socioeconomic History  . Marital status: Married    Spouse name: Herbie Baltimore  . Number of children: 2  . Years of education: 38  . Highest education level: Not on file  Occupational History  . 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 Financial resource strain: Not on file  . Food  insecurity    Worry: Not on file    Inability: Not on file  . Transportation needs    Medical: Not on file    Non-medical: Not on file  Tobacco Use  . Smoking status: Never Smoker  . Smokeless tobacco: Never Used  Substance and Sexual Activity  . Alcohol use: Yes    Alcohol/week: 0.0 standard drinks    Comment: socially 1-2 glasses weekly  . Drug use: No  . Sexual activity: Yes    Birth control/protection: Other-see comments  Lifestyle  . Physical activity    Days per week: Not on file    Minutes per session: Not on file  . Stress: Not on file  Relationships  . Social Herbalist on phone: Not on file    Gets together: Not on file    Attends religious service: Not on file    Active member of club or organization: Not on file    Attends meetings of clubs or organizations: Not on file    Relationship status: Not on file  . Intimate partner violence    Fear of current or ex partner: Not on file    Emotionally abused: Not on file    Physically abused: Not on file    Forced sexual activity: Not on file  Other Topics Concern  . Not on file  Social History Narrative   Lives with husband in a 2 story home.  Has 2 grown children.  Owns her own business.  Education: Forensic psychologist.     REVIEW OF SYSTEMS: Constitutional: No fevers, chills, or sweats, no generalized fatigue, change in appetite Eyes: No visual changes, double vision, eye pain Ear, nose and throat: No hearing loss, ear pain, nasal congestion, sore throat Cardiovascular: No chest pain, palpitations Respiratory:  No shortness of breath at rest or with exertion, wheezes GastrointestinaI: No nausea, vomiting, diarrhea, abdominal pain, fecal incontinence Genitourinary:  No dysuria, urinary retention or frequency Musculoskeletal:  No neck pain, back pain Integumentary: No rash, pruritus, skin lesions Neurological: as above Psychiatric: No depression, insomnia, anxiety  Endocrine: No palpitations, fatigue,  diaphoresis, mood swings, change in appetite, change in weight, increased thirst Hematologic/Lymphatic:  No purpura, petechiae. Allergic/Immunologic: no itchy/runny eyes, nasal congestion, recent allergic reactions, rashes  PHYSICAL EXAM: Blood pressure 136/76, pulse 84, height 5\' 1"  (1.549 m), weight 171 lb 11.2 oz (77.9 kg), SpO2 97 %.  General: No acute distress.  Patient appears well-groomed.   Head:  Normocephalic/atraumatic Eyes:  fundi examined but not visualized Neck: supple, no paraspinal tenderness, full range of motion Back: No paraspinal tenderness Heart: regular rate and rhythm Lungs: Clear to auscultation bilaterally. Vascular: No carotid bruits. Neurological Exam: Mental status: alert and oriented to person, place, and time, recent and remote memory intact, fund of knowledge intact, attention and concentration intact, speech fluent and not dysarthric, language intact. Cranial nerves: CN I: not tested CN II: pupils equal, round and reactive to light, visual fields intact CN III, IV, VI:  full range of motion, no nystagmus, no ptosis CN V: facial sensation intact CN VII: upper and lower face symmetric CN VIII: decreased hearing in left ear CN IX, X: gag intact, uvula midline CN XI: sternocleidomastoid and trapezius muscles intact CN XII: tongue midline Bulk & Tone: normal, no fasciculations. Motor:  5/5 throughout  Sensation:  temperature and vibration sensation intact. Deep Tendon Reflexes:  2+ throughout, toes downgoing.   Finger to nose testing:  Without dysmetria.   Heel to shin:  Without dysmetria.   Gait:  Normal station and stride.  Able to turn and tandem walk. Romberg negative.  IMPRESSION: 1.  Secondary trigeminal neuralgia 2.  Left sided tinnitus with otalgia and headache.  May certainly be induced by trigeminal neuralgia.  However, I would like to evaluate for other structural abnormalities in the inner ear.  PLAN: 1.  MRI of internal auditory canals  with and without contrast 2.  Increase nortriptyline to 75mg  at bedtime 3.  She is continuing carbamazepine 200mg  twice daily 4.  Follow up in 3 to 4 months.  Thank you for allowing me to take part in the care of this patient.  Metta Clines, DO  CC: Pricilla Holm, MD

## 2018-11-01 ENCOUNTER — Ambulatory Visit (INDEPENDENT_AMBULATORY_CARE_PROVIDER_SITE_OTHER): Payer: Medicare Other | Admitting: Neurology

## 2018-11-01 ENCOUNTER — Other Ambulatory Visit (INDEPENDENT_AMBULATORY_CARE_PROVIDER_SITE_OTHER): Payer: Medicare Other

## 2018-11-01 ENCOUNTER — Other Ambulatory Visit: Payer: Self-pay

## 2018-11-01 ENCOUNTER — Encounter: Payer: Self-pay | Admitting: Neurology

## 2018-11-01 ENCOUNTER — Telehealth: Payer: Self-pay | Admitting: Neurology

## 2018-11-01 VITALS — BP 136/76 | HR 84 | Ht 61.0 in | Wt 171.7 lb

## 2018-11-01 DIAGNOSIS — H9202 Otalgia, left ear: Secondary | ICD-10-CM

## 2018-11-01 DIAGNOSIS — R200 Anesthesia of skin: Secondary | ICD-10-CM

## 2018-11-01 DIAGNOSIS — G500 Trigeminal neuralgia: Secondary | ICD-10-CM

## 2018-11-01 DIAGNOSIS — Z5181 Encounter for therapeutic drug level monitoring: Secondary | ICD-10-CM | POA: Diagnosis not present

## 2018-11-01 DIAGNOSIS — H9312 Tinnitus, left ear: Secondary | ICD-10-CM

## 2018-11-01 DIAGNOSIS — R202 Paresthesia of skin: Secondary | ICD-10-CM | POA: Diagnosis not present

## 2018-11-01 MED ORDER — NORTRIPTYLINE HCL 75 MG PO CAPS: 75.0000 mg | ORAL_CAPSULE | Freq: Every day | ORAL | 3 refills | Status: DC

## 2018-11-01 NOTE — Telephone Encounter (Signed)
Patient is wanting to know why the MRI is of her brain when she is needing it done on her inner ear. Thanks!

## 2018-11-01 NOTE — Telephone Encounter (Signed)
Called patient no answer. Left message that MRI Brain will show inner canal this was specified on orders.

## 2018-11-01 NOTE — Patient Instructions (Addendum)
1.  I have increased nortriptyline to 75mg  at bedtime.  If pain not improved in 6 weeks, we can make adjustments. 2.  Continue carbamazepine 200mg  twice daily 3.  Check CBC, CMP, TSH, NORTRIPTYLINE LEVEL 4.  Check MRI of internal auditory canals with and without contrast 5.  Follow up in 3 to 4 months.  Your provider has requested that you have labwork completed today. Please go to [facility] ([id number]) on the second floor of this building before leaving the office today. You do not need to check in. If you are not called within 15 minutes please check with the front desk.    A referral to [facility] has been placed for your MRI someone will contact you directly to schedule your appt. They are located at [place]. Please contact them directly by calling [phone] with any questions regarding your referral.

## 2018-11-02 ENCOUNTER — Telehealth: Payer: Self-pay | Admitting: Neurology

## 2018-11-02 NOTE — Telephone Encounter (Signed)
Patient left msg with after hours about MRI- her insurance wont cover since she just had one a [facility]. Wants another type of MRI. Two places nearby can do inner auditory MRI. She said to call [phone] choose opt 2 if they want to go ahead and schedule this. There is [facility] or [facility] covered by both.

## 2018-11-02 NOTE — Telephone Encounter (Signed)
Please see below  Will call [facility] about orders I have a order form from [place] we can use MRI IACs-7th & 8th nerve wo/w for Auditory. Will ask another provider to sign due to provider being out of office. Will send [facility] staff message to start PA.  Please be aware are you ok with this?  Spoke with patient she was made aware of this

## 2018-11-02 NOTE — Addendum Note (Signed)
Addended by: Ranae Plumber on: 11/02/2018 10:11 AM   Modules accepted: Orders

## 2018-11-02 NOTE — Telephone Encounter (Signed)
Noted.  OK for specific MRI IACs 7th and 8th nerve wo/w for auditory.

## 2018-11-03 ENCOUNTER — Telehealth: Payer: Self-pay | Admitting: Neurology

## 2018-11-03 NOTE — Telephone Encounter (Signed)
Patient left msg with after hours about nortriptyline was increased to 75mg  for trigeminal nerve pain and her paresthesia is starting up again. Since April she has had loud pitch noise to ear. She takes carbazabine for noises. She is unable to lay on her side. She is having dizziness that is worse.

## 2018-11-03 NOTE — Telephone Encounter (Signed)
Called patient no answer left message with provider response on voice mail 

## 2018-11-03 NOTE — Telephone Encounter (Signed)
Patient was seen this week. Order for MRI Auditory sent to [facility] for scheduling  See below

## 2018-11-03 NOTE — Telephone Encounter (Signed)
Looks like he just increased her medication 2 days ago.  Probably needs to give it time.  Per Dr. Georgie Chard note, if didn't help, would consider increase in 6 weeks.

## 2018-11-04 LAB — NORTRIPTYLINE LEVEL: Nortriptyline Lvl: 21 mcg/L — ABNORMAL LOW (ref 50–150)

## 2018-11-06 ENCOUNTER — Other Ambulatory Visit: Payer: Self-pay | Admitting: Neurology

## 2018-11-07 DIAGNOSIS — R519 Headache, unspecified: Secondary | ICD-10-CM | POA: Diagnosis not present

## 2018-11-07 DIAGNOSIS — M47812 Spondylosis without myelopathy or radiculopathy, cervical region: Secondary | ICD-10-CM | POA: Diagnosis not present

## 2018-11-07 NOTE — Telephone Encounter (Signed)
Requested Prescriptions   Pending Prescriptions Disp Refills  . nortriptyline (PAMELOR) 75 MG capsule [Pharmacy Med Name: NORTRIPTYLINE HCL 75 MG CAP] 90 capsule 2    Sig: TAKE 1 CAPSULE BY MOUTH AT BEDTIME   Last filled : 11/01/18 #30 3 refills  Resent for 90 days

## 2018-11-09 ENCOUNTER — Telehealth: Payer: Self-pay

## 2018-11-09 NOTE — Telephone Encounter (Signed)
-----   Message from Pieter Partridge, DO sent at 11/09/2018  6:10 AM EST ----- Nortriptyline level is low but that is not a problem.  The primary concern is if it is elevated or if she is toxic, which she is not.

## 2018-11-09 NOTE — Telephone Encounter (Signed)
Called spoke with patient she was informed of results and understands.

## 2018-11-14 ENCOUNTER — Telehealth: Payer: Self-pay | Admitting: Neurology

## 2018-11-14 NOTE — Telephone Encounter (Signed)
Spoke with [name] in [place]. They have not received order. Will resend. Confirmed fax number [phone]

## 2018-11-14 NOTE — Telephone Encounter (Signed)
Will check with [facility] about PA

## 2018-11-14 NOTE — Telephone Encounter (Signed)
Rhonda Henry I sent to Ranae Plumber, CMA        Hey no prior Josem Kaufmann is required for this member  [employer]

## 2018-11-14 NOTE — Telephone Encounter (Signed)
Spoke with patient she was informed NO PA required per [employer]. S he also have contact number to call [facility] to schedule. She states that she will wait on them to call her first.

## 2018-11-14 NOTE — Telephone Encounter (Signed)
Patient called regarding not having heard anything on Scheduling her MRI or it needing an  Authorization? Please call. Thank you

## 2018-11-15 ENCOUNTER — Ambulatory Visit (INDEPENDENT_AMBULATORY_CARE_PROVIDER_SITE_OTHER): Payer: Medicare Other | Admitting: Gastroenterology

## 2018-11-15 ENCOUNTER — Encounter: Payer: Self-pay | Admitting: Gastroenterology

## 2018-11-15 ENCOUNTER — Other Ambulatory Visit: Payer: Self-pay

## 2018-11-15 VITALS — BP 142/66 | HR 86 | Temp 97.9°F | Ht 61.0 in | Wt 169.0 lb

## 2018-11-15 DIAGNOSIS — K5900 Constipation, unspecified: Secondary | ICD-10-CM | POA: Diagnosis not present

## 2018-11-15 MED ORDER — LINACLOTIDE 72 MCG PO CAPS: 72.0000 ug | ORAL_CAPSULE | Freq: Every day | ORAL | 11 refills | Status: AC

## 2018-11-15 MED ORDER — POLYETHYLENE GLYCOL 3350 17 GM/SCOOP PO POWD: 1.0000 | Freq: Once | ORAL | 0 refills | Status: AC

## 2018-11-15 NOTE — Patient Instructions (Addendum)
If you are age 73 or older, your body mass index should be between 23-30. Your Body mass index is 31.93 kg/m. If this is out of the aforementioned range listed, please consider follow up with your Primary Care Provider.  If you are age 88 or younger, your body mass index should be between 19-25. Your Body mass index is 31.93 kg/m. If this is out of the aformentioned range listed, please consider follow up with your Primary Care Provider.   Dr Ardis Hughs recommends that you complete a bowel purge (to clean out your bowels). Please do the following: Purchase a small bottle of Miralax over the counter as well as a 32 oz [employer] will then drink Miralax mixed in an adequate amount of water/juice/gatorade (you may choose which of these liquids to drink) over the next 2-3 hours. You should expect results within 1 to 6 hours after completing the bowel purge.  We have sent the following medications to your pharmacy for you to pick up at your convenience:  1. Restart Linzess 72 mcg daily.   Call the office ask to speak with your nurse in about 4-5 weeks with an update.

## 2018-11-15 NOTE — Progress Notes (Signed)
Review of pertinent gastrointestinal problems: 1.Routine risk for colon cancer:Colonoscopy Dr. Ardis Hughs 11/2015 found no polyps. Colonoscopy Dr. Earlean Shawl 2006 also found no polyps. 2. Hemorrhoids and left colon diverticulosisnoted on colonoscopy 2017. 3. Constipation: trial of linzess 2016 under care of Dr. Deatra Ina.   HPI: This is a very pleasant 73 year old woman whom I last saw about 2 years ago.  She was doing very well on Linzess 72 mcg pills 1 pill once daily in terms of her chronic constipation.  She stopped the medicine about 4 months ago but she because she was doing so well.  Within the past several weeks she has had problems with her trigeminal neuralgia and she was started on some new medicines that aggravated her constipation even further.  She had no bowel movement for about 7 days and then resume taking Linzess this past weekend.  Within 1 day she had significant discomfort in her backside and needed to perform a manual disimpaction on herself.  This was successful and she actually had a satisfying bowel movement last night.  She does have some discomfort at her backside since the manual disimpaction.  Blood work October 2020 showed normal TSH, normal CBC, normal complete metabolic profile  Chief complaint is chronic constipation  ROS: complete GI ROS as described in HPI, all other review negative.  Constitutional:  No unintentional weight loss   Past Medical History:  Diagnosis Date  . Abdominal pain, acute, right upper quadrant    had gall bladder removed and now no issues   . Allergy   . Anxiety   . Arthritis   . Bloating   . Complication of anesthesia    "I shake like crazy"  . Constipation    not now  . Depression    no issues currently   . Difficulty sleeping    no issues now  . GERD (gastroesophageal reflux disease)   . Heart murmur    no recent mention of this per pt.   . Hypertension   . Microscopic hematuria     x 30 yrs   . Nausea   . Nerve damage    face -1 yr ago and now almost resolved   . Neuromuscular disorder ([place])   . Recurrent UTI     Past Surgical History:  Procedure Laterality Date  . breast biopsies    . BREAST LUMPECTOMY     x 2  . c sections     x2  . CHOLECYSTECTOMY N/A 03/13/2013   Procedure: LAPAROSCOPIC CHOLECYSTECTOMY WITH INTRAOPERATIVE CHOLANGIOGRAM;  Surgeon: [name]. [name], MD;  Location: [facility];  Service: General;  Laterality: N/A;  . COLONOSCOPY    . CYST EXCISION     removed from back and left arm  . FACIAL COSMETIC SURGERY  7-10   compl by thick scars and a scar necrosis on R cheek  . LUMBAR EPIDURAL INJECTION  Jan 15   . [place]  . 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 Alpha-Lipoic Acid 600 MG CAPS Take 3 capsules by mouth daily.     Marland Kitchen ALPRAZolam (XANAX) 0.5 MG tablet Take 0.5 mg by mouth at bedtime as needed.    Marland Kitchen amLODipine (NORVASC) 5 MG tablet Take by mouth.    . carbamazepine (TEGRETOL) 100 MG  chewable tablet Chew 200 mg by mouth 3 (three) times daily.     . Cholecalciferol (VITAMIN D3) 25 MCG (1000 UT) CAPS Take by mouth.    . clonazePAM (KLONOPIN) 0.25 MG disintegrating tablet TAKE 1 TABLET BY MOUTH 2 TIMES DAILY AS NEEDED    . linaclotide (LINZESS) 72 MCG capsule Take 72 mcg by mouth daily before breakfast.    . meloxicam (MOBIC) 15 MG tablet Take by mouth.    . nortriptyline (PAMELOR) 75 MG capsule TAKE 1 CAPSULE BY MOUTH AT BEDTIME 90 capsule 2  . omeprazole (PRILOSEC) 40 MG capsule Take 40 mg by mouth daily.    . Turmeric Curcumin 500 MG CAPS Take 2 capsules by mouth daily.      No current facility-administered medications for this visit.     Allergies as of 11/15/2018 - Review Complete 11/15/2018  Allergen Reaction Noted  . Ciprofloxacin Other  (See Comments) 02/23/2017  . Other Hives and Other (See Comments) 05/10/2013  . Desipramine hcl Rash   . Penicillins Hives and Rash   . Tape Hives 03/30/2013    Family History  Problem Relation Age of Onset  . Thyroid cancer Mother   . Heart disease Mother   . Macular degeneration Mother   . Stroke Mother   . Hypertension Mother   . Glaucoma Father   . Pneumonia Father        passed away with this  . Hypertension Other   . Stomach cancer Paternal Grandmother   . Healthy Child   . Colon cancer Neg Hx   . Colon polyps Neg Hx   . Rectal cancer Neg Hx   . Pancreatic cancer Neg Hx     Social History   Socioeconomic History  . Marital status: Married    Spouse name: Herbie Baltimore  . Number of children: 2  . Years of education: 16  . Highest education level: Bachelor's degree (e.g., BA, AB, BS)  Occupational History  . 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 Financial resource strain: Not on file  . Food insecurity    Worry: Not on file    Inability: Not on file  . Transportation needs    Medical: Not on file    Non-medical: Not on file  Tobacco Use  . Smoking status: Never Smoker  . Smokeless tobacco: Never Used  Substance and Sexual Activity  . Alcohol use: Yes    Alcohol/week: 0.0 standard drinks    Comment: socially 1-2 glasses weekly  . Drug use: No  . Sexual activity: Yes    Birth control/protection: Other-see comments  Lifestyle  . Physical activity    Days per week: Not on file    Minutes per session: Not on file  . Stress: Not on file  Relationships  . Social Herbalist on phone: Not on file    Gets together: Not on file    Attends religious service: Not on file    Active member of club or organization: Not on file    Attends meetings of clubs or organizations: Not on file    Relationship status: Not on file  . Intimate partner violence    Fear of current or ex partner: Not  on file    Emotionally abused: Not on file    Physically abused: Not on file    Forced sexual activity: Not on file  Other Topics Concern  .  Not on file  Social History Narrative   Lives with husband in a 2 story home.  Has 2 grown children.  Owns her own business.  Education: Forensic psychologist.  Left handed     Physical Exam: Temp 97.9 F (36.6 C) (Temporal)   Ht 5\' 1"  (1.549 m)   Wt 169 lb (76.7 kg)   BMI 31.93 kg/m  Constitutional: generally well-appearing Psychiatric: alert and oriented x3 Abdomen: soft, nontender, nondistended, no obvious ascites, no peritoneal signs, normal bowel sounds No peripheral edema noted in lower extremities  Assessment and plan: 73 y.o. female with chronic constipation  I think her chronic constipation was worsened since starting some medicines for her trigeminal neuralgia.  She actually had to manually disimpact herself over the weekend.  I recommended that she complete a bowel purge in the next day or 2 to really empty out her colon and then continue with her recently restarted Linzess 72 mcg pills since they are working so well for her for such a long time.  She will call in 4 5 weeks to report explain how she has been doing she understands that she is on the low-dose of Linzess and certainly that could be increased at if needed.  She needs refills on the Linzess I am happy to call that in for her now.  72 mcg pills for 30 days with 11 refills.  Please see the "Patient Instructions" section for addition details about the plan.  Owens Loffler, MD [facility] 11/15/2018, 1:56 PM

## 2018-11-16 DIAGNOSIS — F4322 Adjustment disorder with anxiety: Secondary | ICD-10-CM | POA: Diagnosis not present

## 2018-12-11 DIAGNOSIS — F0630 Mood disorder due to known physiological condition, unspecified: Secondary | ICD-10-CM | POA: Diagnosis not present

## 2018-12-11 DIAGNOSIS — G500 Trigeminal neuralgia: Secondary | ICD-10-CM | POA: Diagnosis not present

## 2018-12-11 DIAGNOSIS — G44329 Chronic post-traumatic headache, not intractable: Secondary | ICD-10-CM | POA: Diagnosis not present

## 2018-12-11 DIAGNOSIS — H9312 Tinnitus, left ear: Secondary | ICD-10-CM | POA: Diagnosis not present

## 2018-12-11 DIAGNOSIS — R6884 Jaw pain: Secondary | ICD-10-CM | POA: Diagnosis not present

## 2018-12-11 DIAGNOSIS — F068 Other specified mental disorders due to known physiological condition: Secondary | ICD-10-CM | POA: Diagnosis not present

## 2018-12-13 ENCOUNTER — Encounter: Payer: Self-pay | Admitting: Internal Medicine

## 2018-12-13 ENCOUNTER — Other Ambulatory Visit: Payer: Self-pay

## 2018-12-13 ENCOUNTER — Institutional Professional Consult (permissible substitution): Payer: Medicare Other | Admitting: Plastic Surgery

## 2018-12-13 DIAGNOSIS — G500 Trigeminal neuralgia: Secondary | ICD-10-CM

## 2018-12-14 NOTE — Telephone Encounter (Signed)
Can you route recent neurology referral to [facility]

## 2018-12-24 ENCOUNTER — Encounter: Payer: Self-pay | Admitting: Internal Medicine

## 2019-01-05 DEATH — deceased

## 2019-01-12 ENCOUNTER — Telehealth: Payer: Self-pay

## 2019-01-12 NOTE — Telephone Encounter (Signed)
Submitted VOB for SynviscOne, right knee. 

## 2019-01-15 ENCOUNTER — Telehealth: Payer: Self-pay

## 2019-01-15 NOTE — Telephone Encounter (Signed)
Approved for SynviscOne, right knee. Buy & Bill Must meet Medicare deductible first Patient will be responsible for 20% OOP. No Co-pay No PA required  Appt. 02/01/2019 with Dr. Marlou Sa

## 2019-01-25 ENCOUNTER — Encounter: Payer: Self-pay | Admitting: Neurology

## 2019-01-28 ENCOUNTER — Encounter: Payer: Self-pay | Admitting: Orthopedic Surgery

## 2019-02-01 ENCOUNTER — Ambulatory Visit: Payer: Medicare Other | Admitting: Orthopedic Surgery

## 2019-02-05 ENCOUNTER — Ambulatory Visit: Payer: Medicare Other | Admitting: Neurology
